# Patient Record
Sex: Female | Born: 1970 | Race: Black or African American | Hispanic: No | Marital: Married | Smoking: Former smoker
Health system: Southern US, Community
[De-identification: ages and names within clinical notes are randomized; demographics above are authoritative.]

## PROBLEM LIST (undated history)

## (undated) DIAGNOSIS — T7840XA Allergy, unspecified, initial encounter: Secondary | ICD-10-CM

## (undated) DIAGNOSIS — E663 Overweight: Secondary | ICD-10-CM

## (undated) DIAGNOSIS — Z0000 Encounter for general adult medical examination without abnormal findings: Secondary | ICD-10-CM

## (undated) DIAGNOSIS — E785 Hyperlipidemia, unspecified: Secondary | ICD-10-CM

## (undated) DIAGNOSIS — F419 Anxiety disorder, unspecified: Secondary | ICD-10-CM

## (undated) DIAGNOSIS — Z780 Asymptomatic menopausal state: Secondary | ICD-10-CM

## (undated) DIAGNOSIS — I10 Essential (primary) hypertension: Secondary | ICD-10-CM

## (undated) DIAGNOSIS — N926 Irregular menstruation, unspecified: Secondary | ICD-10-CM

## (undated) DIAGNOSIS — G43909 Migraine, unspecified, not intractable, without status migrainosus: Secondary | ICD-10-CM

## (undated) DIAGNOSIS — Z720 Tobacco use: Secondary | ICD-10-CM

## (undated) DIAGNOSIS — N898 Other specified noninflammatory disorders of vagina: Secondary | ICD-10-CM

## (undated) DIAGNOSIS — D649 Anemia, unspecified: Secondary | ICD-10-CM

## (undated) DIAGNOSIS — N938 Other specified abnormal uterine and vaginal bleeding: Secondary | ICD-10-CM

## (undated) DIAGNOSIS — N951 Menopausal and female climacteric states: Secondary | ICD-10-CM

## (undated) DIAGNOSIS — R232 Flushing: Secondary | ICD-10-CM

## (undated) HISTORY — DX: Overweight: E66.3

## (undated) HISTORY — DX: Menopausal and female climacteric states: N95.1

## (undated) HISTORY — DX: Anxiety disorder, unspecified: F41.9

## (undated) HISTORY — DX: Asymptomatic menopausal state: Z78.0

## (undated) HISTORY — PX: TUBAL LIGATION: SHX77

## (undated) HISTORY — DX: Allergy, unspecified, initial encounter: T78.40XA

## (undated) HISTORY — PX: ECTOPIC PREGNANCY SURGERY: SHX613

## (undated) HISTORY — DX: Other specified abnormal uterine and vaginal bleeding: N93.8

## (undated) HISTORY — DX: Tobacco use: Z72.0

## (undated) HISTORY — DX: Anemia, unspecified: D64.9

## (undated) HISTORY — DX: Other specified noninflammatory disorders of vagina: N89.8

## (undated) HISTORY — DX: Migraine, unspecified, not intractable, without status migrainosus: G43.909

## (undated) HISTORY — DX: Encounter for general adult medical examination without abnormal findings: Z00.00

## (undated) HISTORY — DX: Essential (primary) hypertension: I10

## (undated) HISTORY — DX: Flushing: R23.2

## (undated) HISTORY — DX: Hyperlipidemia, unspecified: E78.5

## (undated) HISTORY — PX: OTHER SURGICAL HISTORY: SHX169

## (undated) HISTORY — DX: Irregular menstruation, unspecified: N92.6

---

## 2001-04-14 ENCOUNTER — Emergency Department (HOSPITAL_COMMUNITY): Admission: EM | Admit: 2001-04-14 | Discharge: 2001-04-14 | Payer: Self-pay | Admitting: Emergency Medicine

## 2004-12-09 ENCOUNTER — Emergency Department (HOSPITAL_COMMUNITY): Admission: EM | Admit: 2004-12-09 | Discharge: 2004-12-09 | Payer: Self-pay | Admitting: Emergency Medicine

## 2005-08-22 ENCOUNTER — Ambulatory Visit: Payer: Self-pay | Admitting: Family Medicine

## 2005-12-28 ENCOUNTER — Encounter: Payer: Self-pay | Admitting: Family Medicine

## 2005-12-28 DIAGNOSIS — K5900 Constipation, unspecified: Secondary | ICD-10-CM | POA: Insufficient documentation

## 2005-12-28 DIAGNOSIS — D649 Anemia, unspecified: Secondary | ICD-10-CM | POA: Insufficient documentation

## 2005-12-28 DIAGNOSIS — F17200 Nicotine dependence, unspecified, uncomplicated: Secondary | ICD-10-CM | POA: Insufficient documentation

## 2005-12-28 DIAGNOSIS — G43909 Migraine, unspecified, not intractable, without status migrainosus: Secondary | ICD-10-CM

## 2005-12-28 DIAGNOSIS — O00109 Unspecified tubal pregnancy without intrauterine pregnancy: Secondary | ICD-10-CM | POA: Insufficient documentation

## 2006-01-02 ENCOUNTER — Ambulatory Visit: Payer: Self-pay | Admitting: Family Medicine

## 2006-01-02 ENCOUNTER — Other Ambulatory Visit: Admission: RE | Admit: 2006-01-02 | Discharge: 2006-01-02 | Payer: Self-pay | Admitting: Family Medicine

## 2006-01-07 ENCOUNTER — Encounter (INDEPENDENT_AMBULATORY_CARE_PROVIDER_SITE_OTHER): Payer: Self-pay | Admitting: Family Medicine

## 2006-01-23 ENCOUNTER — Encounter (INDEPENDENT_AMBULATORY_CARE_PROVIDER_SITE_OTHER): Payer: Self-pay | Admitting: Family Medicine

## 2006-01-23 LAB — CONVERTED CEMR LAB
ALT: 10 units/L (ref 0–35)
Albumin: 4.2 g/dL (ref 3.5–5.2)
Alkaline Phosphatase: 44 units/L (ref 39–117)
Eosinophils Relative: 8 % — ABNORMAL HIGH (ref 0–5)
Glucose, Bld: 104 mg/dL — ABNORMAL HIGH (ref 70–99)
HCT: 39.3 % (ref 36.0–46.0)
LDL Cholesterol: 137 mg/dL — ABNORMAL HIGH (ref 0–99)
Lymphocytes Relative: 36 % (ref 12–46)
Neutro Abs: 3.1 10*3/uL (ref 1.7–7.7)
Platelets: 383 10*3/uL (ref 150–400)
Potassium: 5 meq/L (ref 3.5–5.3)
RDW: 14.2 % — ABNORMAL HIGH (ref 11.5–14.0)
Sodium: 139 meq/L (ref 135–145)
TSH: 1.387 microintl units/mL (ref 0.350–5.50)
Total Protein: 7.2 g/dL (ref 6.0–8.3)
Triglycerides: 52 mg/dL (ref <150)

## 2006-01-26 ENCOUNTER — Encounter (INDEPENDENT_AMBULATORY_CARE_PROVIDER_SITE_OTHER): Payer: Self-pay | Admitting: Family Medicine

## 2006-06-26 ENCOUNTER — Telehealth (INDEPENDENT_AMBULATORY_CARE_PROVIDER_SITE_OTHER): Payer: Self-pay | Admitting: Family Medicine

## 2006-06-29 ENCOUNTER — Encounter (INDEPENDENT_AMBULATORY_CARE_PROVIDER_SITE_OTHER): Payer: Self-pay | Admitting: Family Medicine

## 2006-06-29 ENCOUNTER — Telehealth (INDEPENDENT_AMBULATORY_CARE_PROVIDER_SITE_OTHER): Payer: Self-pay | Admitting: *Deleted

## 2006-06-29 ENCOUNTER — Telehealth (INDEPENDENT_AMBULATORY_CARE_PROVIDER_SITE_OTHER): Payer: Self-pay | Admitting: Family Medicine

## 2007-01-14 ENCOUNTER — Encounter: Payer: Self-pay | Admitting: Family Medicine

## 2007-04-13 ENCOUNTER — Encounter (INDEPENDENT_AMBULATORY_CARE_PROVIDER_SITE_OTHER): Payer: Self-pay | Admitting: Family Medicine

## 2007-04-13 ENCOUNTER — Other Ambulatory Visit: Admission: RE | Admit: 2007-04-13 | Discharge: 2007-04-13 | Payer: Self-pay | Admitting: Family Medicine

## 2007-04-13 ENCOUNTER — Telehealth (INDEPENDENT_AMBULATORY_CARE_PROVIDER_SITE_OTHER): Payer: Self-pay | Admitting: *Deleted

## 2007-04-13 ENCOUNTER — Ambulatory Visit: Payer: Self-pay | Admitting: Family Medicine

## 2007-04-13 DIAGNOSIS — E669 Obesity, unspecified: Secondary | ICD-10-CM | POA: Insufficient documentation

## 2007-04-13 DIAGNOSIS — E785 Hyperlipidemia, unspecified: Secondary | ICD-10-CM | POA: Insufficient documentation

## 2007-04-13 DIAGNOSIS — N898 Other specified noninflammatory disorders of vagina: Secondary | ICD-10-CM | POA: Insufficient documentation

## 2007-04-14 ENCOUNTER — Telehealth (INDEPENDENT_AMBULATORY_CARE_PROVIDER_SITE_OTHER): Payer: Self-pay | Admitting: *Deleted

## 2007-04-14 ENCOUNTER — Encounter (INDEPENDENT_AMBULATORY_CARE_PROVIDER_SITE_OTHER): Payer: Self-pay | Admitting: Family Medicine

## 2007-04-14 ENCOUNTER — Ambulatory Visit (HOSPITAL_COMMUNITY): Admission: RE | Admit: 2007-04-14 | Discharge: 2007-04-14 | Payer: Self-pay | Admitting: Family Medicine

## 2007-04-14 LAB — CONVERTED CEMR LAB
Candida species: NEGATIVE
Gardnerella vaginalis: POSITIVE — AB
Trichomonal Vaginitis: NEGATIVE

## 2007-04-15 ENCOUNTER — Telehealth (INDEPENDENT_AMBULATORY_CARE_PROVIDER_SITE_OTHER): Payer: Self-pay | Admitting: *Deleted

## 2007-04-16 LAB — CONVERTED CEMR LAB: Pap Smear: NORMAL

## 2007-04-17 ENCOUNTER — Encounter (INDEPENDENT_AMBULATORY_CARE_PROVIDER_SITE_OTHER): Payer: Self-pay | Admitting: Family Medicine

## 2007-04-19 LAB — CONVERTED CEMR LAB
ALT: 10 units/L (ref 0–35)
AST: 15 units/L (ref 0–37)
Basophils Absolute: 0 10*3/uL (ref 0.0–0.1)
Basophils Relative: 1 % (ref 0–1)
CO2: 23 meq/L (ref 19–32)
Calcium: 9.5 mg/dL (ref 8.4–10.5)
Chloride: 110 meq/L (ref 96–112)
Cholesterol: 175 mg/dL (ref 0–200)
Hemoglobin: 12.5 g/dL (ref 12.0–15.0)
Lymphocytes Relative: 30 % (ref 12–46)
Neutro Abs: 5.1 10*3/uL (ref 1.7–7.7)
Neutrophils Relative %: 59 % (ref 43–77)
Platelets: 328 10*3/uL (ref 150–400)
Potassium: 4.3 meq/L (ref 3.5–5.3)
RDW: 14.6 % (ref 11.5–15.5)
Sodium: 142 meq/L (ref 135–145)
TSH: 1.503 microintl units/mL (ref 0.350–5.50)
Total Protein: 7.1 g/dL (ref 6.0–8.3)

## 2007-04-27 ENCOUNTER — Ambulatory Visit: Payer: Self-pay | Admitting: Family Medicine

## 2007-04-27 LAB — CONVERTED CEMR LAB
Cholesterol, target level: 200 mg/dL
LDL Goal: 160 mg/dL

## 2007-05-27 ENCOUNTER — Telehealth (INDEPENDENT_AMBULATORY_CARE_PROVIDER_SITE_OTHER): Payer: Self-pay | Admitting: *Deleted

## 2007-07-29 ENCOUNTER — Ambulatory Visit: Payer: Self-pay | Admitting: Family Medicine

## 2007-09-09 ENCOUNTER — Ambulatory Visit: Payer: Self-pay | Admitting: Family Medicine

## 2008-10-04 ENCOUNTER — Encounter (INDEPENDENT_AMBULATORY_CARE_PROVIDER_SITE_OTHER): Payer: Self-pay | Admitting: Family Medicine

## 2009-04-09 ENCOUNTER — Ambulatory Visit: Payer: Self-pay | Admitting: Family Medicine

## 2009-04-09 DIAGNOSIS — R5381 Other malaise: Secondary | ICD-10-CM | POA: Insufficient documentation

## 2009-04-09 DIAGNOSIS — R5383 Other fatigue: Secondary | ICD-10-CM

## 2009-04-09 DIAGNOSIS — E049 Nontoxic goiter, unspecified: Secondary | ICD-10-CM

## 2009-05-01 ENCOUNTER — Encounter: Payer: Self-pay | Admitting: Family Medicine

## 2009-06-21 ENCOUNTER — Encounter: Payer: Self-pay | Admitting: Family Medicine

## 2009-09-07 ENCOUNTER — Telehealth: Payer: Self-pay | Admitting: Family Medicine

## 2009-09-10 ENCOUNTER — Telehealth: Payer: Self-pay | Admitting: Family Medicine

## 2009-09-28 ENCOUNTER — Other Ambulatory Visit: Admission: RE | Admit: 2009-09-28 | Discharge: 2009-09-28 | Payer: Self-pay | Admitting: Family Medicine

## 2009-09-28 ENCOUNTER — Ambulatory Visit: Payer: Self-pay | Admitting: Family Medicine

## 2009-09-28 DIAGNOSIS — R7303 Prediabetes: Secondary | ICD-10-CM

## 2009-10-04 ENCOUNTER — Ambulatory Visit (HOSPITAL_COMMUNITY): Admission: RE | Admit: 2009-10-04 | Discharge: 2009-10-04 | Payer: Self-pay | Admitting: Family Medicine

## 2009-10-04 ENCOUNTER — Encounter: Payer: Self-pay | Admitting: Family Medicine

## 2009-10-04 LAB — CONVERTED CEMR LAB: Pap Smear: NEGATIVE

## 2009-10-16 ENCOUNTER — Encounter: Payer: Self-pay | Admitting: Family Medicine

## 2009-10-16 LAB — CONVERTED CEMR LAB
Basophils Absolute: 0 10*3/uL (ref 0.0–0.1)
Basophils Relative: 1 % (ref 0–1)
CO2: 26 meq/L (ref 19–32)
Calcium: 9.7 mg/dL (ref 8.4–10.5)
Cholesterol: 171 mg/dL (ref 0–200)
Creatinine, Ser: 1.04 mg/dL (ref 0.40–1.20)
Eosinophils Relative: 5 % (ref 0–5)
HCT: 37.8 % (ref 36.0–46.0)
HDL: 47 mg/dL (ref >39)
Hemoglobin: 12.6 g/dL (ref 12.0–15.0)
Lymphocytes Relative: 42 % (ref 12–46)
MCHC: 33.3 g/dL (ref 30.0–36.0)
Monocytes Absolute: 0.3 10*3/uL (ref 0.1–1.0)
Platelets: 407 10*3/uL — ABNORMAL HIGH (ref 150–400)
RDW: 13.8 % (ref 11.5–15.5)
Sodium: 136 meq/L (ref 135–145)
TSH: 2.271 microintl units/mL (ref 0.350–4.500)
Total CHOL/HDL Ratio: 3.6
Triglycerides: 60 mg/dL (ref <150)

## 2009-10-17 ENCOUNTER — Ambulatory Visit (HOSPITAL_COMMUNITY): Admission: RE | Admit: 2009-10-17 | Discharge: 2009-10-17 | Payer: Self-pay | Admitting: Family Medicine

## 2009-11-19 ENCOUNTER — Telehealth: Payer: Self-pay | Admitting: Family Medicine

## 2009-11-21 ENCOUNTER — Telehealth: Payer: Self-pay | Admitting: Family Medicine

## 2009-12-28 ENCOUNTER — Ambulatory Visit: Payer: Self-pay | Admitting: Family Medicine

## 2010-02-12 NOTE — Letter (Signed)
Summary: progress notes  progress notes   Imported By: Curtis Sites 08/06/2009 11:48:07  _____________________________________________________________________  External Attachment:    Type:   Image     Comment:   External Document

## 2010-02-12 NOTE — Letter (Signed)
Summary: demographic  demographic   Imported By: Curtis Sites 08/06/2009 11:47:24  _____________________________________________________________________  External Attachment:    Type:   Image     Comment:   External Document

## 2010-02-12 NOTE — Progress Notes (Signed)
Summary: refill  Phone Note Call from Patient   Summary of Call: pt states rx needs to go to [facility]. not [facility] [phone] Initial call taken by: Rudene Anda,  September 10, 2009 10:06 AM    Prescriptions: PHENTERMINE HCL 37.5 MG TABS (PHENTERMINE HCL) Take 1 tablet by mouth once a day  #30 x 0   Entered by:   Adella Hare LPN   Authorized by:   Syliva Overman MD   Signed by:   Adella Hare LPN on 84/16/6063   Method used:   Printed then faxed to ...       [address] [phone]* (retail)       [address]       [place], [place]  [address]       Ph: [phone] or [phone]       Fax: [phone]   RxID:   [id number]  rx at [facility] cancelled Adella Hare LPN  September 10, 2009 10:14 AM

## 2010-02-12 NOTE — Assessment & Plan Note (Signed)
Summary: phy   Vital Signs:  Patient profile:   40 year old female Menstrual status:  regular Height:      66 inches Weight:      196.75 pounds BMI:     31.87 O2 Sat:      98 % Pulse rate:   68 / minute Pulse rhythm:   regular Resp:     16 per minute BP sitting:   122 / 80  (left arm) Cuff size:   large  Vitals Entered By: Everitt Amber LPN (September 28, 2009 9:35 AM)  Nutrition Counseling: Patient's BMI is greater than 25 and therefore counseled on weight management options. CC: CPE   Primary Care [facility] Thede:  Syliva Overman MD  CC:  CPE.  History of Present Illness: Reports  that she has been doing well. Denies recent fever or chills. Denies sinus pressure, nasal congestion , ear pain or sore throat. Denies chest congestion, or cough productive of sputum. Denies chest pain, palpitations, PND, orthopnea or leg swelling. Denies abdominal pain, nausea, vomitting, diarrhea or constipation. Denies change in bowel movements or bloody stool. Denies dysuria , frequency, incontinence or hesitancy. Denies  joint pain, swelling, or reduced mobility. Denies headaches, vertigo, seizures. Denies depression, anxiety or insomnia. Denies  rash, lesions, or itch. Dissatisfied with weight loss and rightly so, only exercising 3 days per week, will inc also still drinking sodas, will stop. C/O anorgasmia, though interest in intercourse still present.Spouse home on weekends only, encouraged Date nights etc     Allergies: No Known Drug Allergies  Past History:  Past medical, surgical, family and social histories (including risk factors) reviewed for relevance to current acute and chronic problems.  Past Medical History: Reviewed history from 07/29/2007 and no changes required. VAGINAL DISCHARGE (ICD-623.5) - BV HYPERLIPIDEMIA (ICD-272.4) OVERWEIGHT (ICD-278.02) WELL ADULT EXAM (ICD-V70.0) DISORDER, TOBACCO USE (ICD-305.1) MIGRAINE HEADACHE (ICD-346.90) CONSTIPATION  (ICD-564.00) TUBAL PREGNANCY (ICD-633.10) ANEMIA-NOS (ICD-285.9)  Past Surgical History: Reviewed history from 04/13/2007 and no changes required. OOPHORECTOMY AND TUBAL LIGATION - Ectopic Pregnancy  Family History: Reviewed history from 04/09/2009 and no changes required. Father: 92 - CAD with CABG Mother 56 - HTN, COPD, DM Brothers - 1-   - W&L Sisters: 2 - and 75 - HTN  Social History: Reviewed history from 04/09/2009 and no changes required. 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 Endo:  Denies excessive thirst and excessive urination. Heme:  Denies abnormal bruising and bleeding. Allergy:  Denies hives or rash and itching eyes.  Physical Exam  General:  Well-developed,obese,in no acute distress; alert,appropriate and cooperative throughout examination Head:  Normocephalic and atraumatic without obvious abnormalities. No apparent alopecia or balding. Eyes:  No corneal or conjunctival inflammation noted. EOMI. Perrla. Funduscopic exam benign, without hemorrhages, exudates or papilledema. Vision grossly normal. Ears:  External ear exam shows no significant lesions or deformities.  Otoscopic examination reveals clear canals, tympanic membranes are intact bilaterally without bulging, retraction, inflammation or discharge. Hearing is grossly normal bilaterally. Nose:  External nasal examination shows no deformity or inflammation. Nasal mucosa are pink and moist without lesions or exudates. Mouth:  Oral mucosa and oropharynx without lesions or exudates.  Teeth in good repair. Neck:  No deformities, masses, or tenderness noted. Chest Wall:  No deformities, masses, or tenderness noted. Breasts:  No mass, nodules, thickening, tenderness, bulging, retraction,  inflamation, nipple discharge or skin changes noted.   Lungs:  Normal respiratory  effort, chest expands symmetrically. Lungs are clear to auscultation, no crackles or wheezes. Heart:  Normal rate and regular rhythm. S1 and S2 normal without gallop, murmur, click, rub or other extra sounds. Abdomen:  Bowel sounds positive,abdomen soft and non-tender without masses, organomegaly or hernias noted. Rectal:  No external abnormalities noted. Normal sphincter tone. No rectal masses or tenderness. Genitalia:  Normal introitus for age, no external lesions, no vaginal discharge, mucosa pink and moist, no vaginal or cervical lesions, no vaginal atrophy, no friaility or hemorrhage, normal uterus size and position, no adnexal masses or tenderness Msk:  No deformity or scoliosis noted of thoracic or lumbar spine.   Pulses:  R and L carotid,radial,femoral,dorsalis pedis and posterior tibial pulses are full and equal bilaterally Extremities:  No clubbing, cyanosis, edema, or deformity noted with normal full range of motion of all joints.   Neurologic:  No cranial nerve deficits noted. Station and gait are normal. Plantar reflexes are down-going bilaterally. DTRs are symmetrical throughout. Sensory, motor and coordinative functions appear intact. Skin:  Intact without suspicious lesions or rashes Cervical Nodes:  No lymphadenopathy noted Axillary Nodes:  No palpable lymphadenopathy Inguinal Nodes:  No significant adenopathy Psych:  Cognition and judgment appear intact. Alert and cooperative with normal attention span and concentration. No apparent delusions, illusions, hallucinations   Impression & Recommendations:  Problem # 1:  SPECIAL SCREENING FOR MALIGNANT NEOPLASMS COLON (ICD-V76.51) Assessment Comment Only  Orders: Hemoccult Guaiac-1 spec.(in office) (82270)  Problem # 2:  SCREENING FOR MALIGNANT NEOPLASM OF THE CERVIX (ICD-V76.2) Assessment: Comment Only  Orders: Pap Smear ([address])  Problem # 3:  IMPAIRED FASTING GLUCOSE (ICD-790.21) Assessment: Comment Only  Orders: T-  Hemoglobin A1C (60454-09811) Pt advised to reduce carbohydrate intake, espescially sweets, and to start regular physical activity, at least 30 minutes 5 days weekly, to enable weight loss, and reduce the risk of becoming diabetic   Problem # 4:  HYPERLIPIDEMIA (ICD-272.4) Assessment: Comment Only  Orders: T-Lipid Profile ([id number]) Low fat diet discussed and encouraged, and literature also given   Labs Reviewed: SGOT: 15 (04/17/2007)   SGPT: 10 (04/17/2007)  Lipid Goals: Chol Goal: 200 (04/27/2007)   HDL Goal: 40 (04/27/2007)   LDL Goal: 160 (04/27/2007)   TG Goal: 150 (04/27/2007)  Prior 10 Yr Risk Heart Disease: 1 % (09/09/2007)   HDL:46 (04/17/2007), 45 (01/23/2006)  LDL:121 (04/17/2007), 137 (01/23/2006)  Chol:175 (04/17/2007), 192 (01/23/2006)  Trig:40 (04/17/2007), 52 (01/23/2006)  Problem # 5:  OBESITY (ICD-278.00) Assessment: Unchanged  Ht: 66 (09/28/2009)   Wt: 196.75 (09/28/2009)   BMI: 31.87 (09/28/2009)  Complete Medication List: 1)  Phentermine Hcl 37.5 Mg Tabs (Phentermine hcl) .... Take 1 tablet by mouth once a day  Other Orders: Radiology other (Radiology Other) T-Basic Metabolic Panel [id number]) T-TSH [phone]) T-CBC w/Diff [phone]) Influenza Vaccine NON MCR (36644) Tdap => 29yrs IM (03474) Admin 1st Vaccine (25956) Admin 1st Vaccine [facility]) [phone])  Patient Instructions: 1)  Please schedule a follow-up appointment in 3 months. 2)  BMP prior to visit, ICD-9: 3)  Lipid Panel prior to visit, ICD-9: 4)  TSH prior to visit, ICD-9:   fasting asap 5)  CBC w/ Diff prior to visit, ICD-9: 6)  HbgA1C prior to visit, ICD-9: 7)  !200 to 1500 cal diet  8)  continue phentermine half daily. 9)  It is important that you exercise regularly at least 60 minutes 7 times a week. If you develop  chest pain, have severe difficulty breathing, or feel very tired , stop exercising immediately and seek medical attention. 10)  You need to lose weight. Consider a  lower calorie diet and regular exercise. On avg goal is 3 to 4 pounds per week Prescriptions: PHENTERMINE HCL 37.5 MG TABS (PHENTERMINE HCL) Take 1 tablet by mouth once a day  #30 x 1   Entered by:   Everitt Amber LPN   Authorized by:   Syliva Overman MD   Signed by:   Everitt Amber LPN on 16/10/9602   Method used:   Printed then faxed to ...       [address] [phone]* (retail)       [address]       [place], [place]  [address]       Ph: [phone] or [phone]       Fax: [phone]   RxID:   [id number]     Tetanus/Td Vaccine    Vaccine Type: Tdap    Site: left deltoid    Mfr: boostrix    Dose: 0.5 ml    Route: IM    Given by: Everitt Amber LPN    Exp. Date: 07/13/2011    Lot #: [id number]  Influenza Vaccine    Vaccine Type: Fluvax Non-MCR    Site: right deltoid    Mfr: novartis     Dose: 0.5 ml    Route: IM    Given by: Everitt Amber LPN    Exp. Date: 05/2010    Lot #: [id number]

## 2010-02-12 NOTE — Letter (Signed)
Summary: misc.  misc.   Imported By: Curtis Sites 08/06/2009 11:47:52  _____________________________________________________________________  External Attachment:    Type:   Image     Comment:   External Document

## 2010-02-12 NOTE — Progress Notes (Signed)
Summary: MEDICINE  Phone Note Call from Patient   Summary of Call: CAME IN MONDAY FOR WEIGH IN AND WANTS TO KNOW WILL YOU CALL IN THE RX FOR DIET PILLS OR DOES SHE NEED A VISIT  NEXT VISIT IS DEC 16 [facility] TO [facility] HER AND LET KNOW LET KNOW SHE IS OUT [phone] Initial call taken by: Lind Guest,  November 21, 2009 9:24 AM  Follow-up for Phone Call        patient aware med has been sent  Follow-up by: Adella Hare LPN,  November 21, 2009 10:06 AM

## 2010-02-12 NOTE — Letter (Signed)
Summary: Letter  Letter   Imported By: Lind Guest 10/04/2009 15:51:32  _____________________________________________________________________  External Attachment:    Type:   Image     Comment:   External Document

## 2010-02-12 NOTE — Assessment & Plan Note (Signed)
Summary: new pt   Vital Signs:  Patient profile:   40 year old female Menstrual status:  regular LMP:     03/16/2009 Height:      66 inches Weight:      207.25 pounds BMI:     33.57 O2 Sat:      97 % Pulse rate:   63 / minute Pulse rhythm:   regular Resp:     16 per minute BP sitting:   124 / 80  (left arm) Cuff size:   large  Vitals Entered By: Everitt Amber LPN (April 09, 2009 3:55 PM)  Nutrition Counseling: Patient's BMI is greater than 25 and therefore counseled on weight management options. CC: New patient LMP (date): 03/16/2009     Menstrual Status regular Enter LMP: 03/16/2009 Last PAP Result normal   Primary Care Provider:  Syliva Overman MD  CC:  New patient.  History of Present Illness: Pt's main concern is he rweight . Since  stopping nicotine she has gained quite a bit of weight. She exercises on avg weekly. Has used phentermine in the past with good result Reports  that she is otherwise  doing well. Denies recent fever or chills. Denies sinus pressure, nasal congestion , ear pain or sore throat. Denies chest congestion, or cough productive of sputum. Denies chest pain, palpitations, PND, orthopnea or leg swelling. Denies abdominal pain, nausea, vomitting, diarrhea or constipation. Denies change in bowel movements or bloody stool. Denies dysuria , frequency, incontinence or hesitancy. Denies  joint pain, swelling, or reduced mobility. Denies headaches, vertigo, seizures. Denies depression, anxiety or insomnia. Denies  rash, lesions, or itch.     Current Medications (verified): 1)  None  Allergies (verified): No Known Drug Allergies  Past History:  Past Medical History: Last updated: 07/29/2007 VAGINAL DISCHARGE (ICD-623.5) - BV HYPERLIPIDEMIA (ICD-272.4) OVERWEIGHT (ICD-278.02) WELL ADULT EXAM (ICD-V70.0) DISORDER, TOBACCO USE (ICD-305.1) MIGRAINE HEADACHE (ICD-346.90) CONSTIPATION (ICD-564.00) TUBAL PREGNANCY  (ICD-633.10) ANEMIA-NOS (ICD-285.9)  Past Surgical History: Last updated: 04/13/2007 OOPHORECTOMY AND TUBAL LIGATION - Ectopic Pregnancy  Family History: Last updated: 04/09/2009 Father: 39 - CAD with CABG Mother 62 - HTN, COPD, DM Brothers - 1-   - W&L Sisters: 2 - and 37 - HTN  Social History: Last updated: 04/09/2009 Occupation: Dentist Smoker -quit in 2008 ETOH - none Married 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 GI:  Complains of abdominal pain, constipation, excessive appetite, nausea, and vomiting; chronic on avg  3 days per week, she has nausea with headaches . Neuro:  Complains of headaches; denies poor balance and seizures; on avg one every 3 months. Endo:  Denies cold intolerance, excessive hunger, excessive thirst, excessive urination, heat intolerance, polyuria, and weight change. Heme:  Denies abnormal bruising and bleeding. Allergy:  Complains of seasonal allergies.  Physical Exam  General:  Well-developed,obese,in no acute distress; alert,appropriate and cooperative throughout examination HEENT: No facial asymmetry,  EOMI, No sinus tenderness, TM's Clear, oropharynx  pink and moist.   Chest: Clear to auscultation  bilaterally.  CVS: S1, S2, No murmurs, No S3.   Abd: Soft, Nontender.  MS: Adequate ROM spine, hips, shoulders and knees.  Ext: No edema.   CNS: CN 2-12 intact, power tone and sensation normal throughout.   Skin: Intact, no visible lesions or rashes.  Psych: Good eye contact, normal affect.   Memory intact, not anxious or depressed appearing.    Impression & Recommendations:  Problem # 1:  OBESITY (ICD-278.00) Assessment Comment Only  Ht: 66 (04/09/2009)   Wt: 207.25 (04/09/2009)   BMI: 33.57 (04/09/2009)pt counselled on dietary change and regularlexercise for approx 10 mins and phentermine also prescribed.  Problem # 2:  HYPERLIPIDEMIA (ICD-272.4) Assessment: Comment Only  Orders: T-Lipid Profile ([id number])labs to beobtained asap, low fat diet info given  Labs Reviewed: SGOT: 15 (04/17/2007)   SGPT: 10 (04/17/2007)  Lipid Goals: Chol Goal: 200 (04/27/2007)   HDL Goal: 40 (04/27/2007)   LDL Goal: 160 (04/27/2007)   TG Goal: 150 (04/27/2007)  Prior 10 Yr Risk Heart Disease: 1 % (09/09/2007)   HDL:46 (04/17/2007), 45 (01/23/2006)  LDL:121 (04/17/2007), 137 (01/23/2006)  Chol:175 (04/17/2007), 192 (01/23/2006)  Trig:40 (04/17/2007), 52 (01/23/2006)  Complete Medication List: 1)  Phentermine Hcl 37.5 Mg Tabs (Phentermine hcl) .... Take 1 tablet by mouth once a day  Other Orders: T-Basic Metabolic Panel [phone]) T-CBC w/Diff [phone]) T-TSH [phone]) T-Vitamin D (25-Hydroxy) [phone])  Patient Instructions: 1)  Please schedule a follow-up appointment in 2 months. 2)  It is important that you exercise regularly at least 60 minutes 6 times a week. If you develop chest pain, have severe difficulty breathing, or feel very tired , stop exercising immediately and seek medical attention. 3)  You need to lose weight. Consider a lower calorie diet and regular exercise.  4)  Pls start phentermineHALF tab daily, stop and call if adverse sid effects 5)  BMP prior to visit, ICD-9: 6)  Lipid Panel prior to visit, ICD-9:  fasting  asap 7)  TSH prior to visit, ICD-9: 8)  CBC w/ Diff prior to visit, ICD-9: 9)  Congrats on stopping smoking. 10)  You need a mamo and thyroid US , pkls call when you are able Prescriptions: PHENTERMINE HCL 37.5 MG TABS (PHENTERMINE  HCL) Take 1 tablet by mouth once a day  #30 x 0   Entered and Authorized by:   Syliva Overman MD   Signed by:   Syliva Overman MD on 04/09/2009   Method used:   Print then Give to Patient   RxID:   [id number]

## 2010-02-12 NOTE — Progress Notes (Signed)
  Phone Note Call from Patient   Caller: Patient Summary of Call: patient was supposed to have PAP today but started her menstral cycle, came into office and rescheduled appt and also wanted to weigh because Dr has her on phentermine. weighed 196.04 wants refill for phentermine sent [facility]. Initial call taken by: Adella Hare LPN,  September 07, 2009 8:38 AM  Follow-up for Phone Call        let pgt know shwe needs to lose weightto demonstrate the value ofphentermine , she has not, refill x 1 only, needs to have ov resched before further refills Follow-up by: Syliva Overman MD,  September 07, 2009 9:35 AM  Additional Follow-up for Phone Call Additional follow up Details #1::        #DISCONNECTED RX SENT Additional Follow-up by: Adella Hare LPN,  September 07, 2009 3:04 PM    Prescriptions: PHENTERMINE HCL 37.5 MG TABS (PHENTERMINE HCL) Take 1 tablet by mouth once a day  #30 x 0   Entered by:   Adella Hare LPN   Authorized by:   Syliva Overman MD   Signed by:   Adella Hare LPN on 35/57/3220   Method used:   Printed then faxed to ...       [facility] [address] [phone]* (retail)       [address] [place]  [address]       Ph: [phone]       Fax: [phone]   RxID:   [id number]

## 2010-02-12 NOTE — Progress Notes (Signed)
  Phone Note Call from Patient   Caller: Patient Summary of Call: patient walked into office today to weigh 192.04 wants refill on phentermine Initial call taken by: Adella Hare LPN,  November 19, 2009 9:41 AM  Follow-up for Phone Call        refill x 1 needss ov before any further refills pls Follow-up by: Syliva Overman MD,  November 19, 2009 1:04 PM    Prescriptions: PHENTERMINE HCL 37.5 MG TABS (PHENTERMINE HCL) Take 1 tablet by mouth once a day  #30 x 0   Entered by:   Adella Hare LPN   Authorized by:   Syliva Overman MD   Signed by:   Adella Hare LPN on 16/10/9602   Method used:   Printed then faxed to ...       [address] [phone]* (retail)       [address]       [place], [place]  [address]       Ph: [phone] or [phone]       Fax: [phone]   RxID:   [id number]

## 2010-02-12 NOTE — Letter (Signed)
Summary: labs  labs   Imported By: Curtis Sites 08/06/2009 11:47:36  _____________________________________________________________________  External Attachment:    Type:   Image     Comment:   External Document

## 2010-02-12 NOTE — Assessment & Plan Note (Signed)
Summary: physical   Vital Signs:  Patient profile:   40 year old female Menstrual status:  regular LMP:     06/12/2009 Height:      66 inches Weight:      195.50 pounds BMI:     31.67 O2 Sat:      96 % Pulse rate:   101 / minute Resp:     16 per minute CC: CPE  Vision Screening:Left eye w/o correction: 20 / 30 Right Eye w/o correction: 20 / 30 Both eyes w/o correction:  20/ 25  Color vision testing: normal     Vision Comments: didn't bring glasses  Vision Entered By: Syliva Overman MD (June 21, 2009 2:21 PM) LMP (date): 06/12/2009     Enter LMP: 06/12/2009 Last PAP Result normal   CC:  CPE.  Current Medications (verified): 1)  Phentermine Hcl 37.5 Mg Tabs (Phentermine Hcl) .... Take 1 Tablet By Mouth Once A Day  Allergies (verified): No Known Drug Allergies   Complete Medication List: 1)  Phentermine Hcl 37.5 Mg Tabs (Phentermine hcl) .... Take 1 tablet by mouth once a day rescheduled appt since she will have insurance July 1

## 2010-02-14 NOTE — Assessment & Plan Note (Signed)
Summary: F UP   Vital Signs:  Patient profile:   40 year old female Menstrual status:  regular Height:      66 inches Weight:      191.75 pounds BMI:     31.06 O2 Sat:      99 % on Room air Pulse rate:   94 / minute Pulse rhythm:   regular Resp:     16 per minute BP sitting:   120 / 90  (left arm)  Vitals Entered By: Adella Hare LPN (December 28, 2009 8:19 AM)  Nutrition Counseling: Patient's BMI is greater than 25 and therefore counseled on weight management options.  O2 Flow:  Room air CC: follow-up visit Is Patient Diabetic? No   Primary Care Provider:  Syliva Overman MD  CC:  follow-up visit.  History of Present Illness: Reports  that t she has been doing well. she denies intolerance or any adverse side efeects from the phentermine, and has actually lost 17 pounds in the past months, which is wonderful. Denies recent fever or chills. Denies sinus pressure, nasal congestion , ear pain or sore throat. Denies chest congestion, or cough productive of sputum. Denies chest pain, palpitations, PND, orthopnea or leg swelling. Denies abdominal pain, nausea, vomitting, diarrhea or constipation. Denies change in bowel movements or bloody stool. Denies dysuria , frequency, incontinence or hesitancy. Denies  joint pain, swelling, or reduced mobility. Denies headaches, vertigo, seizures. Denies depression, anxiety or insomnia. Denies  rash, lesions, or itch.     Current Medications (verified): 1)  Phentermine Hcl 37.5 Mg Tabs (Phentermine Hcl) .... Take 1 Tablet By Mouth Once A Day  Allergies (verified): No Known Drug Allergies  Review of Systems      See HPI Eyes:  Denies blurring and discharge. Endo:  Denies cold intolerance, excessive hunger, excessive thirst, excessive urination, and heat intolerance. Heme:  Denies abnormal bruising and bleeding. Allergy:  Denies hives or rash and itching eyes.  Physical Exam  General:  Well-developed,well-nourished,in no  acute distress; alert,appropriate and cooperative throughout examination HEENT: No facial asymmetry,  EOMI, No sinus tenderness, TM's Clear, oropharynx  pink and moist.   Chest: Clear to auscultation bilaterally.  CVS: S1, S2, No murmurs, No S3.   Abd: Soft, Nontender.  MS: Adequate ROM spine, hips, shoulders and knees.  Ext: No edema.   CNS: CN 2-12 intact, power tone and sensation normal throughout.   Skin: Intact, no visible lesions or rashes.  Psych: Good eye contact, normal affect.  Memory intact, not anxious or depressed appearing.    Impression & Recommendations:  Problem # 1:  IMPAIRED FASTING GLUCOSE (ICD-790.21) Assessment Comment Only  Labs Reviewed: Creat: 1.04 (10/16/2009)    hBA1C  5.8 Pt advised to reduce carbohydrate intake, espescially sweets, and to start regular physical activity, at least 30 minutes 5 days weekly, to enable weight loss, and reduce the risk of becoming diabetic   Problem # 2:  OBESITY (ICD-278.00) Assessment: Improved  Ht: 66 (12/28/2009)   Wt: 191.75 (12/28/2009)   BMI: 31.06 (12/28/2009) therapeutic lifestyle change discussed and encouraged continue phentermine hal;f daily  Problem # 3:  HYPERLIPIDEMIA (ICD-272.4) Assessment: Comment Only  Labs Reviewed: SGOT: 15 (04/17/2007)   SGPT: 10 (04/17/2007)  Lipid Goals: Chol Goal: 200 (04/27/2007)   HDL Goal: 40 (04/27/2007)   LDL Goal: 160 (04/27/2007)   TG Goal: 150 (04/27/2007)  Prior 10 Yr Risk Heart Disease: 1 % (09/09/2007)   HDL:47 (10/16/2009), 46 (04/17/2007)  LDL:112 (10/16/2009), 121 (04/17/2007)  Chol:171 (10/16/2009), 175 (04/17/2007)  Trig:60 (10/16/2009), 40 (04/17/2007) Low fat dietdiscussed and encouraged  Complete Medication List: 1)  Ibuprofen 800 Mg Tabs (Ibuprofen) .... Take 1 tablet by mouth two times a day as needed for headache, max 6 per week 2)  Phentermine Hcl 37.5 Mg Tabs (Phentermine hcl) .... Take 1 tablet by mouth once a day 3)  Promethazine Hcl 25 Mg Tabs  (Promethazine hcl) .... Take 1 tablet by mouth two times a day as needed for nausea  Patient Instructions: 1)  Please schedule a follow-up appointment in 4 months. 2)  It is important that you exercise regularly at least 30 minutes 5 times a week. If you develop chest pain, have severe difficulty breathing, or feel very tired , stop exercising immediately and seek medical attention. 3)  You need to lose weight. Consider a lower calorie diet and regular exercise. congrats  on 16 pound weight  loss since March, keep it up!! 4)  continue a low fat diet !Prescriptions: PROMETHAZINE HCL 25 MG TABS (PROMETHAZINE HCL) Take 1 tablet by mouth two times a day as needed for nausea  #15 x 0   Entered and Authorized by:   Syliva Overman MD   Signed by:   Syliva Overman MD on 12/28/2009   Method used:   Electronically to        [facility] [phone]* (retail)       [address]       [place], [place]  [address]       Ph: [phone] or [phone]       Fax: [phone]   RxID:   [id number] PHENTERMINE HCL 37.5 MG TABS (PHENTERMINE HCL) Take 1 tablet by mouth once a day  #30 x 1   Entered and Authorized by:   Syliva Overman MD   Signed by:   Syliva Overman MD on 12/28/2009   Method used:   Printed then faxed to ...       [address] [phone]* (retail)       [address]       [place], [place]  [address]       Ph: [phone] or [phone]       Fax: [phone]   RxID:   [id number] IBUPROFEN 800 MG TABS (IBUPROFEN) Take 1 tablet by mouth two times a day as needed for headache, max 6 per week  #40 x 1   Entered and Authorized by:   Syliva Overman MD   Signed by:   Syliva Overman MD on 12/28/2009   Method used:   Electronically to        [facility] [phone]* (retail)       [address]       [place], [place]  [address]       Ph: [phone] or [phone]       Fax: [phone]   RxID:    [id number]    Orders Added: 1)  Est. Patient Level III [78938]

## 2010-03-18 ENCOUNTER — Other Ambulatory Visit: Payer: Self-pay | Admitting: Family Medicine

## 2010-03-18 DIAGNOSIS — Z09 Encounter for follow-up examination after completed treatment for conditions other than malignant neoplasm: Secondary | ICD-10-CM

## 2010-04-24 ENCOUNTER — Ambulatory Visit (HOSPITAL_COMMUNITY)
Admission: RE | Admit: 2010-04-24 | Discharge: 2010-04-24 | Disposition: A | Discharge status: Home / Self Care | Payer: BC Managed Care – PPO | Source: Ambulatory Visit | Attending: Family Medicine | Admitting: Family Medicine

## 2010-04-24 DIAGNOSIS — N63 Unspecified lump in unspecified breast: Secondary | ICD-10-CM | POA: Insufficient documentation

## 2010-04-24 DIAGNOSIS — Z09 Encounter for follow-up examination after completed treatment for conditions other than malignant neoplasm: Secondary | ICD-10-CM

## 2010-05-01 ENCOUNTER — Encounter: Payer: Self-pay | Admitting: Family Medicine

## 2010-05-02 ENCOUNTER — Encounter: Payer: Self-pay | Admitting: Family Medicine

## 2010-05-03 ENCOUNTER — Ambulatory Visit (INDEPENDENT_AMBULATORY_CARE_PROVIDER_SITE_OTHER): Payer: BC Managed Care – PPO | Admitting: Family Medicine

## 2010-05-03 ENCOUNTER — Encounter: Payer: Self-pay | Admitting: Family Medicine

## 2010-05-03 DIAGNOSIS — R5383 Other fatigue: Secondary | ICD-10-CM

## 2010-05-03 DIAGNOSIS — E049 Nontoxic goiter, unspecified: Secondary | ICD-10-CM

## 2010-05-03 DIAGNOSIS — E785 Hyperlipidemia, unspecified: Secondary | ICD-10-CM

## 2010-05-03 DIAGNOSIS — R5381 Other malaise: Secondary | ICD-10-CM

## 2010-05-03 DIAGNOSIS — D649 Anemia, unspecified: Secondary | ICD-10-CM

## 2010-05-03 DIAGNOSIS — R7301 Impaired fasting glucose: Secondary | ICD-10-CM

## 2010-05-03 MED ORDER — PHENTERMINE HCL 37.5 MG PO CAPS: 37.5000 mg | ORAL_CAPSULE | ORAL | Status: DC

## 2010-05-03 NOTE — Patient Instructions (Signed)
CPE September 17 or after.  Congrats on weight loss, keep it up.  It is important that you exercise regularly at least 30 minutes 5 times a week. If you develop chest pain, have severe difficulty breathing, or feel very tired, stop exercising immediately and seek medical attention   CBC , fasting chem 7, lipid, TSH  And HBA1C,   Continue current meds as you are doing

## 2010-05-04 NOTE — Progress Notes (Signed)
  Subjective:    Patient ID: Chelsey Lewis, female    DOB: 1970/05/15, 40 y.o.   MRN: [id number]  HPI The PT is here for follow up and re-evaluation of chronic medical condition and  medication management Preventive health is updated, specifically  Cancer screening, and Immunization.   There are no new concerns.  There are no specific complaints . Chelsey Lewis has modified her diet significantly, and with the help of phentermine , has lost an avg of 4 pounds per month, which is wonderful. She has also started exercising, but needs to commit to atr least 150 mins per [facility] still. Her headache frequency is don , and responds to ibuprofen       Review of Systems Denies recent fever or chills. Denies sinus pressure, nasal congestion, ear pain or sore throat. Denies chest congestion, productive cough or wheezing. Denies chest pains, palpitations, paroxysmal nocturnal dyspnea, orthopnea and leg swelling Denies abdominal pain, nausea, vomiting,diarrhea or constipation.  Denies rectal bleeding or change in bowel movement. Denies dysuria, frequency, hesitancy or incontinence. Denies joint pain, swelling and limitation and mobility. Denies seizure, numbness, or tingling. Denies depression, anxiety or insomnia. Denies skin break down or rash.        Objective:   Physical Exam Patient alert and oriented and in no Cardiopulmonary distress.  HEENT: No facial asymmetry, EOMI, no sinus tenderness, TM's clear, Oropharynx pink and moist.  Neck supple no adenopathy.  Chest: Clear to auscultation bilaterally.  CVS: S1, S2 no murmurs, no S3.  ABD: Soft non tender. Bowel sounds normal.  Ext: No edema  MS: Adequate ROM spine, shoulders, hips and knees.  Skin: Intact, no ulcerations or rash noted.  Psych: Good eye contact, normal affect. Memory intact not anxious or depressed appearing.  CNS: CN 2-12 intact, power, tone and sensation normal throughout.        Assessment & Plan:  1. Obesity:  improved, increase exercise and continue phentermine half tab daily 2.Headaches : controlled on current regime, no change

## 2010-09-30 ENCOUNTER — Encounter: Payer: Self-pay | Admitting: Family Medicine

## 2010-10-16 ENCOUNTER — Other Ambulatory Visit: Payer: Self-pay | Admitting: Family Medicine

## 2010-10-16 DIAGNOSIS — Z139 Encounter for screening, unspecified: Secondary | ICD-10-CM

## 2010-10-25 ENCOUNTER — Ambulatory Visit (HOSPITAL_COMMUNITY)
Admission: RE | Admit: 2010-10-25 | Discharge: 2010-10-25 | Disposition: A | Discharge status: Home / Self Care | Payer: BC Managed Care – PPO | Source: Ambulatory Visit | Attending: Family Medicine | Admitting: Family Medicine

## 2010-10-25 DIAGNOSIS — Z1231 Encounter for screening mammogram for malignant neoplasm of breast: Secondary | ICD-10-CM | POA: Insufficient documentation

## 2010-10-25 DIAGNOSIS — Z139 Encounter for screening, unspecified: Secondary | ICD-10-CM

## 2010-11-20 ENCOUNTER — Telehealth: Payer: Self-pay | Admitting: Family Medicine

## 2010-11-22 ENCOUNTER — Encounter: Payer: BC Managed Care – PPO | Admitting: Family Medicine

## 2010-11-22 NOTE — Telephone Encounter (Signed)
Called in to pharmacist at [facility]

## 2010-12-02 ENCOUNTER — Encounter: Payer: Self-pay | Admitting: Family Medicine

## 2010-12-11 ENCOUNTER — Other Ambulatory Visit (HOSPITAL_COMMUNITY)
Admission: RE | Admit: 2010-12-11 | Discharge: 2010-12-11 | Disposition: A | Discharge status: Home / Self Care | Payer: BC Managed Care – PPO | Source: Ambulatory Visit | Attending: Family Medicine | Admitting: Family Medicine

## 2010-12-11 ENCOUNTER — Encounter: Payer: Self-pay | Admitting: Family Medicine

## 2010-12-11 ENCOUNTER — Ambulatory Visit (INDEPENDENT_AMBULATORY_CARE_PROVIDER_SITE_OTHER): Payer: BC Managed Care – PPO | Admitting: Family Medicine

## 2010-12-11 VITALS — BP 136/82 | HR 84 | Resp 16 | Ht 66.75 in | Wt 188.4 lb

## 2010-12-11 DIAGNOSIS — E669 Obesity, unspecified: Secondary | ICD-10-CM

## 2010-12-11 DIAGNOSIS — Z0000 Encounter for general adult medical examination without abnormal findings: Secondary | ICD-10-CM

## 2010-12-11 DIAGNOSIS — Z23 Encounter for immunization: Secondary | ICD-10-CM

## 2010-12-11 DIAGNOSIS — Z01419 Encounter for gynecological examination (general) (routine) without abnormal findings: Secondary | ICD-10-CM | POA: Insufficient documentation

## 2010-12-11 DIAGNOSIS — N898 Other specified noninflammatory disorders of vagina: Secondary | ICD-10-CM

## 2010-12-11 DIAGNOSIS — Z1211 Encounter for screening for malignant neoplasm of colon: Secondary | ICD-10-CM

## 2010-12-11 DIAGNOSIS — N760 Acute vaginitis: Secondary | ICD-10-CM

## 2010-12-11 LAB — HEMOCCULT GUIAC POC 1CARD (OFFICE)

## 2010-12-11 MED ORDER — PHENTERMINE HCL 37.5 MG PO CAPS: 37.5000 mg | ORAL_CAPSULE | ORAL | Status: DC

## 2010-12-11 NOTE — Patient Instructions (Addendum)
F/U in 4 month  Continue half phentermine once daily. Start taking it in the evening before work.  Calorie range from 1200 to 1500 cals/day recommended  You need to aim for at least 6 hours of sleep daily.  You will get info on sleep hygiene  Weight loss goal of  6 pounds in the next 4 month  It is important that you exercise regularly at least 45 minutes 7 times a week. If you develop chest pain, have severe difficulty breathing, or feel very tired, stop exercising immediately and seek medical attention

## 2010-12-11 NOTE — Progress Notes (Signed)
  Subjective:    Patient ID: Chelsey Lewis, female    DOB: 11/10/1970, 41 y.o.   MRN: [id number]  HPI The PT is here for annual exam and re-evaluation of chronic medical conditions, medication management and review of any available recent lab and radiology data.  Preventive health is updated, specifically  Cancer screening and Immunization.    The PT denies any adverse reactions to current medications since the last visit.  There are no new concerns.  C/o malodorous vaginal d/c x 1 week. Reports attention to lifestyle change to improve health and facilitate weight loss, somewhat disappointed at slow rate of progress, actually has lost no weight since her last visit and sl;eep habits are very poor, averaging about 5 hrs daily    Review of Systems See HPI Denies recent fever or chills. Denies sinus pressure, nasal congestion, ear pain or sore throat. Denies chest congestion, productive cough or wheezing. Denies chest pains, palpitations and leg swelling Denies abdominal pain, nausea, vomiting,diarrhea or constipation.   Denies dysuria, frequency, hesitancy or incontinence. Denies joint pain, swelling and limitation in mobility. Denies headaches, seizures, numbness, or tingling. 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        Objective:   Physical Exam Pleasant well nourished female, alert and oriented x 3, in no cardio-pulmonary distress. Afebrile. HEENT No facial trauma or asymetry. Sinuses non tender.  EOMI, PERTL, fundoscopic exam is normal, no hemorhage or exudate.  External ears normal, tympanic membranes clear. Oropharynx moist, no exudate, good dentition. Neck: supple, no adenopathy,JVD or thyromegaly.No bruits.  Chest: Clear to ascultation bilaterally.No crackles or wheezes. Non tender  to palpation  Breast: No asymetry,no masses. No nipple discharge or inversion. No axillary or supraclavicular adenopathy  Cardiovascular system; Heart sounds normal,  S1 and  S2 ,no S3.  No murmur, or thrill. Apical beat not displaced Peripheral pulses normal.  Abdomen: Soft, non tender, no organomegaly or masses. No bruits. Bowel sounds normal. No guarding, tenderness or rebound.  Rectal:  No mass. Guaiac negative stool.  GU: External genitalia normal. No lesions. Vaginal canal normal.fishy discharge. Uterus normal size, no adnexal masses, no cervical motion or adnexal tenderness.  Musculoskeletal exam: Full ROM of spine, hips , shoulders and knees. No deformity ,swelling or crepitus noted. No muscle wasting or atrophy.   Neurologic: Cranial nerves 2 to 12 intact. Power, tone ,sensation and reflexes normal throughout. No disturbance in gait. No tremor.  Skin: Intact, no ulceration, erythema , scaling or rash noted. Pigmentation normal throughout  Psych; Normal mood and affect. Judgement and concentration normal        Assessment & Plan:

## 2010-12-13 ENCOUNTER — Other Ambulatory Visit: Payer: Self-pay | Admitting: Family Medicine

## 2010-12-13 LAB — GC/CHLAMYDIA PROBE AMP, GENITAL: Chlamydia, DNA Probe: NEGATIVE

## 2010-12-13 LAB — WET PREP BY MOLECULAR PROBE: Candida species: NEGATIVE

## 2010-12-13 MED ORDER — METRONIDAZOLE 500 MG PO TABS: 500.0000 mg | ORAL_TABLET | Freq: Two times a day (BID) | ORAL | Status: AC

## 2010-12-15 NOTE — Assessment & Plan Note (Signed)
Unchanged. Patient re-educated about  the importance of commitment to a  minimum of 150 minutes of exercise per week. The importance of healthy food choices with portion control discussed. Encouraged to start a food diary, count calories and to consider  joining a support group. Sample diet sheets offered. Goals set by the patient for the next several months.    

## 2010-12-15 NOTE — Assessment & Plan Note (Signed)
Specimen sent for testing, clinically appears to be BV, will await result before definitive treatment

## 2010-12-24 ENCOUNTER — Telehealth: Payer: Self-pay

## 2010-12-24 NOTE — Telephone Encounter (Signed)
Is still having some milky white discharge and she has finished all of her antibiotic. No odor or itch. Wants to know if she needs anything else?

## 2010-12-26 NOTE — Telephone Encounter (Signed)
Would suggest nothing else be done unless odor or excessive drainage

## 2011-01-02 ENCOUNTER — Other Ambulatory Visit: Payer: Self-pay | Admitting: Family Medicine

## 2011-01-02 MED ORDER — METRONIDAZOLE 500 MG PO TABS: 500.0000 mg | ORAL_TABLET | Freq: Two times a day (BID) | ORAL | Status: DC

## 2011-01-16 ENCOUNTER — Other Ambulatory Visit (INDEPENDENT_AMBULATORY_CARE_PROVIDER_SITE_OTHER): Payer: BC Managed Care – PPO

## 2011-01-16 DIAGNOSIS — Z1211 Encounter for screening for malignant neoplasm of colon: Secondary | ICD-10-CM

## 2011-01-16 LAB — HEMOCCULT GUIAC POC 1CARD (OFFICE)
Card #2 Fecal Occult Blod, POC: NEGATIVE
Card #3 Fecal Occult Blood, POC: NEGATIVE

## 2011-03-27 ENCOUNTER — Ambulatory Visit (INDEPENDENT_AMBULATORY_CARE_PROVIDER_SITE_OTHER): Payer: BC Managed Care – PPO | Admitting: Family Medicine

## 2011-03-27 ENCOUNTER — Ambulatory Visit: Payer: BC Managed Care – PPO | Admitting: Family Medicine

## 2011-03-27 ENCOUNTER — Encounter: Payer: Self-pay | Admitting: Family Medicine

## 2011-03-27 ENCOUNTER — Ambulatory Visit (HOSPITAL_COMMUNITY)
Admission: RE | Admit: 2011-03-27 | Discharge: 2011-03-27 | Disposition: A | Discharge status: Home / Self Care | Payer: BC Managed Care – PPO | Source: Ambulatory Visit | Attending: Family Medicine | Admitting: Family Medicine

## 2011-03-27 VITALS — BP 120/88 | HR 74 | Resp 15 | Ht 66.75 in | Wt 188.0 lb

## 2011-03-27 DIAGNOSIS — M79609 Pain in unspecified limb: Secondary | ICD-10-CM | POA: Insufficient documentation

## 2011-03-27 DIAGNOSIS — M79671 Pain in right foot: Secondary | ICD-10-CM

## 2011-03-27 DIAGNOSIS — E785 Hyperlipidemia, unspecified: Secondary | ICD-10-CM

## 2011-03-27 DIAGNOSIS — E669 Obesity, unspecified: Secondary | ICD-10-CM

## 2011-03-27 MED ORDER — PHENTERMINE HCL 37.5 MG PO CAPS: 37.5000 mg | ORAL_CAPSULE | ORAL | Status: DC

## 2011-03-27 MED ORDER — IBUPROFEN 800 MG PO TABS: 800.0000 mg | ORAL_TABLET | Freq: Three times a day (TID) | ORAL | Status: DC | PRN

## 2011-03-27 MED ORDER — PREDNISONE (PAK) 5 MG PO TABS: 5.0000 mg | ORAL_TABLET | ORAL | Status: DC

## 2011-03-27 NOTE — Progress Notes (Signed)
  Subjective:    Patient ID: Chelsey Lewis, female    DOB: May 10, 1970, 41 y.o.   MRN: [id number]  HPI 2 day h/o foot pain which kept pt uncomfortable all night . Concerned about weight and wants to resume phentermine to assist with weight loss Review of Systems See HPI Denies recent fever or chills. Denies sinus pressure, nasal congestion, ear pain or sore throat. Denies chest congestion, productive cough or wheezing.  Denies joint pain, swelling and limitation in mobility. Denies headaches, seizures, numbness, or tingling. Denies depression, anxiety or insomnia. Denies skin break down or rash.       Objective:   Physical Exam  Patient alert and oriented and in no cardiopulmonary distress.  HEENT: No facial asymmetry, EOMI, no sinus tenderness,  oropharynx pink and moist.  Neck supple no adenopathy.  Chest: Clear to auscultation bilaterally.  CVS: S1, S2 no murmurs, no S3.  ABD: Soft non tender. Bowel sounds normal.  Ext: No edema  MS: Adequate ROM spine, shoulders, hips and knees.Tendr of right  heel  Skin: Intact, no ulcerations or rash noted.  Psych: Good eye contact, normal affect. Memory intact not anxious or depressed appearing.  CNS: CN 2-12 intact, power, tone and sensation normal throughout.       Assessment & Plan:

## 2011-03-27 NOTE — Patient Instructions (Signed)
F/U in 4 month   You absolutely need to start losing weight with dietary change and exercise.  If you do not lose at least 2 to 3 pounds per month, you will not continue to be prescribed phentermine.   You are having right foot pain likely due heel pain, medication has been sent in.  Work excuse for 3/14 and 03/28/2011

## 2011-03-28 ENCOUNTER — Ambulatory Visit: Payer: BC Managed Care – PPO | Admitting: Family Medicine

## 2011-04-06 NOTE — Assessment & Plan Note (Signed)
Deteriorated. Patient re-educated about  the importance of commitment to a  minimum of 150 minutes of exercise per week. The importance of healthy food choices with portion control discussed. Encouraged to start a food diary, count calories and to consider  joining a support group. Sample diet sheets offered. Goals set by the patient for the next several months.    

## 2011-04-06 NOTE — Assessment & Plan Note (Signed)
Hyperlipidemia:Low fat diet discussed and encouraged.   

## 2011-04-06 NOTE — Assessment & Plan Note (Signed)
Acute foot pain, anti inflammatories prescribed.

## 2011-04-10 ENCOUNTER — Ambulatory Visit: Payer: BC Managed Care – PPO | Admitting: Family Medicine

## 2011-04-17 HISTORY — PX: OTHER SURGICAL HISTORY: SHX169

## 2011-05-01 ENCOUNTER — Telehealth: Payer: Self-pay | Admitting: Family Medicine

## 2011-05-01 MED ORDER — PHENTERMINE HCL 37.5 MG PO CAPS: 37.5000 mg | ORAL_CAPSULE | ORAL | Status: DC

## 2011-05-01 NOTE — Telephone Encounter (Signed)
Printed for Dr to sign  

## 2011-06-06 ENCOUNTER — Other Ambulatory Visit: Payer: Self-pay | Admitting: Family Medicine

## 2011-06-13 ENCOUNTER — Other Ambulatory Visit: Payer: Self-pay | Admitting: Family Medicine

## 2011-07-31 ENCOUNTER — Ambulatory Visit: Payer: BC Managed Care – PPO | Admitting: Family Medicine

## 2011-08-14 ENCOUNTER — Ambulatory Visit (INDEPENDENT_AMBULATORY_CARE_PROVIDER_SITE_OTHER): Payer: BC Managed Care – PPO | Admitting: Family Medicine

## 2011-08-14 ENCOUNTER — Encounter: Payer: Self-pay | Admitting: Family Medicine

## 2011-08-14 VITALS — BP 120/90 | HR 80 | Resp 15 | Ht 66.75 in | Wt 194.4 lb

## 2011-08-14 DIAGNOSIS — D649 Anemia, unspecified: Secondary | ICD-10-CM

## 2011-08-14 DIAGNOSIS — R5381 Other malaise: Secondary | ICD-10-CM

## 2011-08-14 DIAGNOSIS — R7301 Impaired fasting glucose: Secondary | ICD-10-CM

## 2011-08-14 DIAGNOSIS — R5383 Other fatigue: Secondary | ICD-10-CM

## 2011-08-14 DIAGNOSIS — E785 Hyperlipidemia, unspecified: Secondary | ICD-10-CM

## 2011-08-14 DIAGNOSIS — E669 Obesity, unspecified: Secondary | ICD-10-CM

## 2011-08-14 DIAGNOSIS — M949 Disorder of cartilage, unspecified: Secondary | ICD-10-CM

## 2011-08-14 DIAGNOSIS — M899 Disorder of bone, unspecified: Secondary | ICD-10-CM

## 2011-08-14 MED ORDER — PHENTERMINE HCL 37.5 MG PO TABS: 37.5000 mg | ORAL_TABLET | Freq: Every day | ORAL | Status: DC

## 2011-08-14 NOTE — Patient Instructions (Signed)
CPE in December.  Mammogram due in October please schedule  Fasting lipid, chem 7, tSH, hBA1c, Vit d, cBC early dec before the visit  Sorry about the accident, glad you are doing better  A healthy diet is rich in fruit, vegetables and whole grains. Poultry fish, nuts and beans are a healthy choice for protein rather then red meat. A low sodium diet and drinking 64 ounces of water daily is generally recommended. Oils and sweet should be limited. Carbohydrates especially for those who are diabetic or overweight, should be limited to 30-45 gram per meal. It is important to eat on a regular schedule, at least 3 times daily. Snacks should be primarily fruits, vegetables or nuts.  It is important that you exercise regularly at least 45 minutes 7 times a week. If you develop chest pain, have severe difficulty breathing, or feel very tired, stop exercising immediately and seek medical attention   Weight loss goal of 3 to 4 pounds per month  Half phentermine daily  pls follow a low fat diet

## 2011-08-17 NOTE — Progress Notes (Signed)
  Subjective:    Patient ID: Chelsey Lewis, female    DOB: 1970/03/18, 41 y.o.   MRN: [id number]  HPI The PT is here for follow up and re-evaluation of chronic medical conditions, medication management and review of any available recent lab and radiology data.  Preventive health is updated, specifically  Cancer screening and Immunization.   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 Denies sinus pressure, nasal congestion, ear pain or sore throat. Denies chest congestion, productive cough or wheezing. Denies chest pains, palpitations and leg swelling Denies abdominal pain, nausea, vomiting,diarrhea or constipation.   Denies dysuria, frequency, hesitancy or incontinence.  Denies headaches, seizures, numbness, or tingling. Denies depression, anxiety or insomnia. Denies skin break down or rash.        Objective:   Physical Exam  Patient alert and oriented and in no cardiopulmonary distress.  HEENT: No facial asymmetry, EOMI, no sinus tenderness,  oropharynx pink and moist.  Neck supple no adenopathy.  Chest: Clear to auscultation bilaterally.  CVS: S1, S2 no murmurs, no S3.  ABD: Soft non tender. Bowel sounds normal.  Ext: No edema  MS: Adequate ROM spine, shoulders, hips and knees.  Skin: Intact, no ulcerations or rash noted.  Psych: Good eye contact, normal affect. Memory intact not anxious or depressed appearing.  CNS: CN 2-12 intact, power, tone and sensation normal throughout.       Assessment & Plan:

## 2011-08-17 NOTE — Assessment & Plan Note (Signed)
Deteriorated. Patient re-educated about  the importance of commitment to a  minimum of 150 minutes of exercise per week. The importance of healthy food choices with portion control discussed. Encouraged to start a food diary, count calories and to consider  joining a support group. Sample diet sheets offered. Goals set by the patient for the next several months.    

## 2011-08-17 NOTE — Assessment & Plan Note (Signed)
Patient educated about the importance of limiting  Carbohydrate intake , the need to commit to daily physical activity for a minimum of 30 minutes , and to commit weight loss. The fact that changes in all these areas will reduce or eliminate all together the development of diabetes is stressed.    

## 2011-08-17 NOTE — Assessment & Plan Note (Signed)
Hyperlipidemia:Low fat diet discussed and encouraged.  No med at this time.  Updated labs before next visit

## 2011-10-14 ENCOUNTER — Other Ambulatory Visit: Payer: Self-pay | Admitting: Family Medicine

## 2011-10-14 DIAGNOSIS — Z139 Encounter for screening, unspecified: Secondary | ICD-10-CM

## 2011-10-28 ENCOUNTER — Ambulatory Visit (HOSPITAL_COMMUNITY): Payer: BC Managed Care – PPO

## 2011-11-03 ENCOUNTER — Ambulatory Visit (HOSPITAL_COMMUNITY)
Admission: RE | Admit: 2011-11-03 | Discharge: 2011-11-03 | Disposition: A | Discharge status: Home / Self Care | Payer: BC Managed Care – PPO | Source: Ambulatory Visit | Attending: Family Medicine | Admitting: Family Medicine

## 2011-11-03 DIAGNOSIS — Z139 Encounter for screening, unspecified: Secondary | ICD-10-CM

## 2011-11-03 DIAGNOSIS — Z1231 Encounter for screening mammogram for malignant neoplasm of breast: Secondary | ICD-10-CM | POA: Insufficient documentation

## 2011-12-23 ENCOUNTER — Other Ambulatory Visit: Payer: Self-pay | Admitting: Family Medicine

## 2011-12-24 ENCOUNTER — Ambulatory Visit (INDEPENDENT_AMBULATORY_CARE_PROVIDER_SITE_OTHER): Payer: BC Managed Care – PPO | Admitting: Family Medicine

## 2011-12-24 ENCOUNTER — Encounter: Payer: Self-pay | Admitting: Family Medicine

## 2011-12-24 ENCOUNTER — Other Ambulatory Visit (HOSPITAL_COMMUNITY)
Admission: RE | Admit: 2011-12-24 | Discharge: 2011-12-24 | Disposition: A | Discharge status: Home / Self Care | Payer: BC Managed Care – PPO | Source: Ambulatory Visit | Attending: Family Medicine | Admitting: Family Medicine

## 2011-12-24 VITALS — BP 136/82 | HR 77 | Resp 18 | Ht 66.75 in | Wt 192.0 lb

## 2011-12-24 DIAGNOSIS — G4700 Insomnia, unspecified: Secondary | ICD-10-CM | POA: Insufficient documentation

## 2011-12-24 DIAGNOSIS — Z1151 Encounter for screening for human papillomavirus (HPV): Secondary | ICD-10-CM | POA: Insufficient documentation

## 2011-12-24 DIAGNOSIS — R7301 Impaired fasting glucose: Secondary | ICD-10-CM

## 2011-12-24 DIAGNOSIS — Z1211 Encounter for screening for malignant neoplasm of colon: Secondary | ICD-10-CM

## 2011-12-24 DIAGNOSIS — Z113 Encounter for screening for infections with a predominantly sexual mode of transmission: Secondary | ICD-10-CM | POA: Insufficient documentation

## 2011-12-24 DIAGNOSIS — E669 Obesity, unspecified: Secondary | ICD-10-CM

## 2011-12-24 DIAGNOSIS — Z0000 Encounter for general adult medical examination without abnormal findings: Secondary | ICD-10-CM

## 2011-12-24 DIAGNOSIS — N760 Acute vaginitis: Secondary | ICD-10-CM

## 2011-12-24 DIAGNOSIS — Z01419 Encounter for gynecological examination (general) (routine) without abnormal findings: Secondary | ICD-10-CM | POA: Insufficient documentation

## 2011-12-24 LAB — BASIC METABOLIC PANEL
Chloride: 106 mEq/L (ref 96–112)
Creat: 1.06 mg/dL (ref 0.50–1.10)
Potassium: 3.8 mEq/L (ref 3.5–5.3)

## 2011-12-24 LAB — CBC WITH DIFFERENTIAL/PLATELET
Basophils Absolute: 0 10*3/uL (ref 0.0–0.1)
Basophils Relative: 0 % (ref 0–1)
Lymphocytes Relative: 38 % (ref 12–46)
MCHC: 32.5 g/dL (ref 30.0–36.0)
Neutro Abs: 3.4 10*3/uL (ref 1.7–7.7)
Platelets: 425 10*3/uL — ABNORMAL HIGH (ref 150–400)
RDW: 15.9 % — ABNORMAL HIGH (ref 11.5–15.5)
WBC: 6.7 10*3/uL (ref 4.0–10.5)

## 2011-12-24 LAB — VITAMIN D 25 HYDROXY (VIT D DEFICIENCY, FRACTURES): Vit D, 25-Hydroxy: 14 ng/mL — ABNORMAL LOW (ref 30–89)

## 2011-12-24 LAB — LIPID PANEL
Cholesterol: 177 mg/dL (ref 0–200)
HDL: 43 mg/dL (ref >39)
LDL Cholesterol: 123 mg/dL — ABNORMAL HIGH (ref 0–99)
Triglycerides: 53 mg/dL (ref <150)

## 2011-12-24 LAB — HEMOGLOBIN A1C: Mean Plasma Glucose: 117 mg/dL — ABNORMAL HIGH (ref <117)

## 2011-12-24 MED ORDER — PHENTERMINE HCL 37.5 MG PO TBDP: 1.0000 | ORAL_TABLET | Freq: Every day | ORAL | Status: DC

## 2011-12-24 MED ORDER — TEMAZEPAM 15 MG PO CAPS: 15.0000 mg | ORAL_CAPSULE | Freq: Every evening | ORAL | Status: DC | PRN

## 2011-12-24 NOTE — Patient Instructions (Addendum)
F/u in 4 month.Ne w med for insomnia, if not effective call at week 3 for higher dose.   Goal of 8 to 12 pound weight loss.Continue half phentermine daily  New med for sleep take daily, call if not effective cPlease practice good sleep hygiene   hBA1C in 4 months.cbc   Please cut back on fried and fatty foods and red meat  Specimen will be sent for testing for bacterial infection  It is important that you exercise regularly at least 30 minutes 5 times a week. If you develop chest pain, have severe difficulty breathing, or feel very tired, stop exercising immediately and seek medical attention  A healthy diet is rich in fruit, vegetables and whole grains. Poultry fish, nuts and beans are a healthy choice for protein rather then red meat. A low sodium diet and drinking 64 ounces of water daily is generally recommended. Oils and sweet should be limited. Carbohydrates especially for those who are diabetic or overweight, should be limited to 34-45 gram per meal. It is important to eat on a regular schedule, at least 3 times daily. Snacks should be primarily fruits, vegetables or nuts.   Take one multivitamin once daily

## 2011-12-24 NOTE — Progress Notes (Signed)
  Subjective:    Patient ID: Chelsey Lewis, female    DOB: Jan 23, 1970, 41 y.o.   MRN: [id number]  HPI The PT is here for annual exam and re-evaluation of chronic medical conditions, medication management and review of any available recent lab and radiology data.  Preventive health is updated, specifically  Cancer screening and Immunization.   Questions or concerns regarding consultations or procedures which the PT has had in the interim are  addressed. The PT denies any adverse reactions to current medications since the last visit.  There are no new concerns.  There are no specific complaints       Review of Systems See HPI Denies recent fever or chills. Denies sinus pressure, nasal congestion, ear pain or sore throat. Denies chest congestion, productive cough or wheezing. Denies chest pains, palpitations and leg swelling Denies abdominal pain, nausea, vomiting,diarrhea or constipation.   Denies dysuria, frequency, hesitancy or incontinence.c/o vaginal d/c which has odor Denies joint pain, swelling and limitation in mobility. Denies headaches, seizures, numbness, or tingling. Denies depression or  anxiety c/o insomnia. Denies skin break down or rash.        Objective:   Physical Exam  Pleasant well nourished female, alert and oriented x 3, in no cardio-pulmonary distress. Afebrile. HEENT No facial trauma or asymetry. Sinuses non tender.  EOMI, PERTL, fundoscopic exam is normal, no hemorhage or exudate.  External ears normal, tympanic membranes clear. Oropharynx moist, no exudate, good dentition. Neck: supple, no adenopathy,JVD or thyromegaly.No bruits.  Chest: Clear to ascultation bilaterally.No crackles or wheezes. Non tender to palpation  Breast: No asymetry,no masses. No nipple discharge or inversion. No axillary or supraclavicular adenopathy  Cardiovascular system; Heart sounds normal,  S1 and  S2 ,no S3.  No murmur, or thrill. Apical beat not  displaced Peripheral pulses normal.  Abdomen: Soft, non tender, no organomegaly or masses. No bruits. Bowel sounds normal. No guarding, tenderness or rebound.  Rectal:  No mass. Guaiac negative stool.  GU: External genitalia normal. No lesions. Vaginal canal normal.white fishy  discharge. Uterus normal size, no adnexal masses, no cervical motion or adnexal tenderness.  Musculoskeletal exam: Full ROM of spine, hips , shoulders and knees. No deformity ,swelling or crepitus noted. No muscle wasting or atrophy.   Neurologic: Cranial nerves 2 to 12 intact. Power, tone ,sensation and reflexes normal throughout. No disturbance in gait. No tremor.  Skin: Intact, no ulceration, erythema , scaling or rash noted. Pigmentation normal throughout  Psych; Normal mood and affect. Judgement and concentration normal       Assessment & Plan:

## 2011-12-28 NOTE — Assessment & Plan Note (Signed)
Chronic problem, avg 4 hrs. Sleep hygiene reviewed. Med to be started

## 2011-12-28 NOTE — Assessment & Plan Note (Signed)
Pelvic breast and rectal exam documented as well as general medical exam. Counseled re importance of healthy diet and commitment to daily physical activity for good health Immunization needs to be updated, will need flu vaccine at pharmacy

## 2011-12-28 NOTE — Assessment & Plan Note (Signed)
Importance of change in diet and weight loss stressed

## 2011-12-28 NOTE — Assessment & Plan Note (Signed)
Unchanged. Patient re-educated about  the importance of commitment to a  minimum of 150 minutes of exercise per week. The importance of healthy food choices with portion control discussed. Encouraged to start a food diary, count calories and to consider  joining a support group. Sample diet sheets offered. Goals set by the patient for the next several months.    

## 2011-12-30 ENCOUNTER — Telehealth: Payer: Self-pay | Admitting: Family Medicine

## 2011-12-31 MED ORDER — METRONIDAZOLE 500 MG PO TABS: 500.0000 mg | ORAL_TABLET | Freq: Two times a day (BID) | ORAL | Status: DC

## 2011-12-31 NOTE — Telephone Encounter (Signed)
Patient made aware of results.  

## 2011-12-31 NOTE — Addendum Note (Signed)
Addended by: Kandis Fantasia B on: 12/31/2011 09:12 AM   Modules accepted: Orders

## 2012-03-10 ENCOUNTER — Emergency Department (HOSPITAL_COMMUNITY)
Admission: EM | Admit: 2012-03-10 | Discharge: 2012-03-10 | Disposition: A | Discharge status: Home / Self Care | Payer: PRIVATE HEALTH INSURANCE | Attending: Emergency Medicine | Admitting: Emergency Medicine

## 2012-03-10 ENCOUNTER — Encounter (HOSPITAL_COMMUNITY): Payer: Self-pay | Admitting: Emergency Medicine

## 2012-03-10 DIAGNOSIS — H9202 Otalgia, left ear: Secondary | ICD-10-CM

## 2012-03-10 DIAGNOSIS — R51 Headache: Secondary | ICD-10-CM | POA: Insufficient documentation

## 2012-03-10 DIAGNOSIS — H9209 Otalgia, unspecified ear: Secondary | ICD-10-CM | POA: Insufficient documentation

## 2012-03-10 DIAGNOSIS — Z8639 Personal history of other endocrine, nutritional and metabolic disease: Secondary | ICD-10-CM | POA: Insufficient documentation

## 2012-03-10 DIAGNOSIS — Z87891 Personal history of nicotine dependence: Secondary | ICD-10-CM | POA: Insufficient documentation

## 2012-03-10 DIAGNOSIS — Z8679 Personal history of other diseases of the circulatory system: Secondary | ICD-10-CM | POA: Insufficient documentation

## 2012-03-10 DIAGNOSIS — Z79899 Other long term (current) drug therapy: Secondary | ICD-10-CM | POA: Insufficient documentation

## 2012-03-10 DIAGNOSIS — Z862 Personal history of diseases of the blood and blood-forming organs and certain disorders involving the immune mechanism: Secondary | ICD-10-CM | POA: Insufficient documentation

## 2012-03-10 DIAGNOSIS — J3489 Other specified disorders of nose and nasal sinuses: Secondary | ICD-10-CM | POA: Insufficient documentation

## 2012-03-10 DIAGNOSIS — E663 Overweight: Secondary | ICD-10-CM | POA: Insufficient documentation

## 2012-03-10 MED ORDER — PSEUDOEPHEDRINE HCL 60 MG PO TABS: ORAL_TABLET | ORAL | Status: DC

## 2012-03-10 MED ORDER — PENICILLIN V POTASSIUM 500 MG PO TABS: ORAL_TABLET | ORAL | Status: DC

## 2012-03-10 MED ORDER — HYDROCODONE-ACETAMINOPHEN 5-325 MG PO TABS: ORAL_TABLET | ORAL | Status: DC

## 2012-03-10 NOTE — ED Notes (Signed)
Pt c/o left ear pain x 2 days  

## 2012-03-10 NOTE — ED Provider Notes (Signed)
History     CSN: [id number]  Arrival date & time 03/10/12  1052   None     Chief Complaint  Patient presents with  . Otalgia    (Consider location/radiation/quality/duration/timing/severity/associated sxs/prior treatment) Patient is a 42 y.o. female presenting with ear pain. The history is provided by the patient.  Otalgia Location:  Left Behind ear:  No abnormality Quality:  Aching and pressure Severity:  Moderate Onset quality:  Sudden Timing:  Intermittent Progression:  Worsening Chronicity:  New Context: not direct blow and not loud noise   Context comment:  Nasal congestion Relieved by:  Nothing Worsened by:  Nothing tried Ineffective treatments:  None tried Associated symptoms: headaches   Associated symptoms: no abdominal pain, no cough, no fever, no neck pain and no vomiting   Risk factors: no recent travel and no chronic ear infection     Past Medical History  Diagnosis Date  . Vaginal discharge   . Hyperlipidemia   . Overweight   . Well adult exam   . Tobacco abuse   . Migraine headache   . Constipation   . Tubal pregnancy   . Anemia     Past Surgical History  Procedure Laterality Date  . Oopherectomy and tubal ligation    . Ectopic pregnancy surgery    . 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 Coronary artery disease Father     with CABG   . COPD Mother   . Diabetes Mother   . Hypertension Mother   . Hypertension Sister     History  Substance Use Topics  . Smoking status: Former Games developer  . Smokeless tobacco: Not on file  . Alcohol Use: Not on file    OB History   Grav Para Term Preterm Abortions TAB SAB Ect Mult Living                  Review of Systems  Constitutional: Negative for fever and activity change.       All ROS Neg except as noted in HPI  HENT: Positive for ear pain. Negative for nosebleeds and neck pain.   Eyes:  Negative for photophobia and discharge.  Respiratory: Negative for cough, shortness of breath and wheezing.   Cardiovascular: Negative for chest pain and palpitations.  Gastrointestinal: Negative for vomiting, abdominal pain and blood in stool.  Genitourinary: Negative for dysuria, frequency and hematuria.  Musculoskeletal: Negative for back pain and arthralgias.  Skin: Negative.   Neurological: Positive for headaches. Negative for dizziness, seizures and speech difficulty.  Psychiatric/Behavioral: Negative for hallucinations and confusion.    Allergies  Review of patient's allergies indicates no known allergies.  Home Medications   Current Outpatient Rx  Name  Route  Sig  Dispense  Refill  . ibuprofen (ADVIL,MOTRIN) 800 MG tablet      TAKE 1 TABLET (800 MG TOTAL)  BY MOUTH EVERY 8 (EIGHT) HOURS AS NEEDEDFOR PAIN.   30 tablet   1   . Phentermine HCl 37.5 MG TBDP   Oral   Take 1 tablet by mouth daily.   30 tablet   1   . temazepam (RESTORIL) 15 MG capsule   Oral   Take 1 capsule (15 mg total) by mouth at bedtime as needed for sleep.   30 capsule   3   . HYDROcodone-acetaminophen (NORCO/VICODIN) 5-325 MG per tablet  1 or 2 po q4h prn pain   20 tablet   0   . penicillin v potassium (VEETID) 500 MG tablet      2 po bid with food   28 tablet   0   . pseudoephedrine (SUDAFED) 60 MG tablet      1 po tid for congestion   30 tablet   0     BP 153/86  Pulse 72  Temp(Src) 97.9 F (36.6 C) (Oral)  Resp 18  SpO2 100%  Physical Exam  Nursing note and vitals reviewed. Constitutional: She is oriented to person, place, and time. She appears well-developed and well-nourished.  Non-toxic appearance.  HENT:  Head: Normocephalic.  Right Ear: Tympanic membrane and external ear normal.  Left Ear: Tympanic membrane and external ear normal.  The external auditory canal on the right is clear. The tympanic membrane on the right is within normal limits. The extra  auditory canal on the left is normal. The tympanic membrane is intact, but there appears to be fluid behind the drum. There is no pain, redness, or swelling behind the right or left year.  Eyes: EOM and lids are normal. Pupils are equal, round, and reactive to light.  Neck: Normal range of motion. Neck supple. Carotid bruit is not present.  There is soreness of the left was posterior cervical chain. No palpable cervical nodes appreciated.  Cardiovascular: Normal rate, regular rhythm, normal heart sounds, intact distal pulses and normal pulses.   Pulmonary/Chest: Breath sounds normal. No respiratory distress.  Abdominal: Soft. Bowel sounds are normal. There is no tenderness. There is no guarding.  Musculoskeletal: Normal range of motion.  Lymphadenopathy:       Head (right side): No submandibular adenopathy present.       Head (left side): No submandibular adenopathy present.    She has no cervical adenopathy.  Neurological: She is alert and oriented to person, place, and time. She has normal strength. No cranial nerve deficit or sensory deficit. She exhibits normal muscle tone. Coordination normal.  Gait wnl.  Skin: Skin is warm and dry.  Psychiatric: She has a normal mood and affect. Her speech is normal.    ED Course  Procedures (including critical care time)  Labs Reviewed - No data to display No results found.   1. Otalgia of left ear       MDM  I have reviewed nursing notes, vital signs, and all appropriate lab and imaging results for this patient. The vital signs for this patient are within normal limits with exception of the blood pressure being 153/86. On examination there is fluid behind the drum, and some nasal congestion present. Will try Sudafed and Norco for pain. Patient is to see the ear nose and throat specialist if not improving.       Kathie Dike, [place] 03/10/12 1246

## 2012-03-10 NOTE — ED Provider Notes (Signed)
  Medical screening examination/treatment/procedure(s) were performed by non-physician practitioner and as supervising physician I was immediately available for consultation/collaboration.    Vida Roller, MD 03/10/12 [phone]

## 2012-04-21 ENCOUNTER — Ambulatory Visit: Payer: BC Managed Care – PPO | Admitting: Family Medicine

## 2012-04-27 ENCOUNTER — Other Ambulatory Visit: Payer: Self-pay | Admitting: Family Medicine

## 2012-04-27 ENCOUNTER — Telehealth: Payer: Self-pay

## 2012-04-27 MED ORDER — MEDROXYPROGESTERONE ACETATE 5 MG PO TABS: 5.0000 mg | ORAL_TABLET | Freq: Two times a day (BID) | ORAL | Status: DC

## 2012-04-27 NOTE — Telephone Encounter (Signed)
I sent in provera one twice daily for 5 days, this should stop her bleeding, pls let her know,

## 2012-04-28 NOTE — Telephone Encounter (Signed)
Called and left message notifying patient.

## 2012-05-02 ENCOUNTER — Encounter (HOSPITAL_COMMUNITY): Payer: Self-pay | Admitting: *Deleted

## 2012-05-02 ENCOUNTER — Emergency Department (HOSPITAL_COMMUNITY)
Admission: EM | Admit: 2012-05-02 | Discharge: 2012-05-02 | Disposition: A | Discharge status: Home / Self Care | Payer: PRIVATE HEALTH INSURANCE | Attending: Emergency Medicine | Admitting: Emergency Medicine

## 2012-05-02 DIAGNOSIS — Z862 Personal history of diseases of the blood and blood-forming organs and certain disorders involving the immune mechanism: Secondary | ICD-10-CM | POA: Insufficient documentation

## 2012-05-02 DIAGNOSIS — N938 Other specified abnormal uterine and vaginal bleeding: Secondary | ICD-10-CM

## 2012-05-02 DIAGNOSIS — Z8742 Personal history of other diseases of the female genital tract: Secondary | ICD-10-CM | POA: Insufficient documentation

## 2012-05-02 DIAGNOSIS — N760 Acute vaginitis: Secondary | ICD-10-CM | POA: Insufficient documentation

## 2012-05-02 DIAGNOSIS — Z8719 Personal history of other diseases of the digestive system: Secondary | ICD-10-CM | POA: Insufficient documentation

## 2012-05-02 DIAGNOSIS — Z8679 Personal history of other diseases of the circulatory system: Secondary | ICD-10-CM | POA: Insufficient documentation

## 2012-05-02 DIAGNOSIS — B9689 Other specified bacterial agents as the cause of diseases classified elsewhere: Secondary | ICD-10-CM

## 2012-05-02 DIAGNOSIS — N949 Unspecified condition associated with female genital organs and menstrual cycle: Secondary | ICD-10-CM | POA: Insufficient documentation

## 2012-05-02 DIAGNOSIS — Z87891 Personal history of nicotine dependence: Secondary | ICD-10-CM | POA: Insufficient documentation

## 2012-05-02 DIAGNOSIS — Z3202 Encounter for pregnancy test, result negative: Secondary | ICD-10-CM | POA: Insufficient documentation

## 2012-05-02 DIAGNOSIS — E663 Overweight: Secondary | ICD-10-CM | POA: Insufficient documentation

## 2012-05-02 LAB — URINALYSIS, ROUTINE W REFLEX MICROSCOPIC
Bilirubin Urine: NEGATIVE
Glucose, UA: NEGATIVE mg/dL
Hgb urine dipstick: NEGATIVE
Ketones, ur: NEGATIVE mg/dL
Nitrite: NEGATIVE
Specific Gravity, Urine: 1.02 (ref 1.005–1.030)
pH: 6 (ref 5.0–8.0)

## 2012-05-02 LAB — CBC
HCT: 35 % — ABNORMAL LOW (ref 36.0–46.0)
Hemoglobin: 11.7 g/dL — ABNORMAL LOW (ref 12.0–15.0)
MCH: 28 pg (ref 26.0–34.0)
MCV: 83.7 fL (ref 78.0–100.0)
Platelets: 400 10*3/uL (ref 150–400)
RBC: 4.18 MIL/uL (ref 3.87–5.11)
WBC: 4.9 10*3/uL (ref 4.0–10.5)

## 2012-05-02 LAB — WET PREP, GENITAL
Trich, Wet Prep: NONE SEEN
Yeast Wet Prep HPF POC: NONE SEEN

## 2012-05-02 MED ORDER — METRONIDAZOLE 500 MG PO TABS: 500.0000 mg | ORAL_TABLET | Freq: Two times a day (BID) | ORAL | Status: DC

## 2012-05-02 MED ORDER — NAPROXEN 250 MG PO TABS: 250.0000 mg | ORAL_TABLET | Freq: Two times a day (BID) | ORAL | Status: DC

## 2012-05-02 MED ORDER — KETOROLAC TROMETHAMINE 60 MG/2ML IM SOLN
60.0000 mg | Freq: Once | INTRAMUSCULAR | Status: DC
  Filled 2012-05-02: qty 2

## 2012-05-02 NOTE — ED Notes (Signed)
Pt c/o vaginal bleeding since April 4, was prescribed medroxyprogesterone this past tues or Wednesday with no change in bleeding, pt reports that she changes a tampon twice a day, bilateral abd pain,

## 2012-05-02 NOTE — ED Notes (Signed)
States that she does not need anything for pain at this point.

## 2012-05-02 NOTE — ED Provider Notes (Signed)
History     CSN: [id number]  Arrival date & time 05/02/12  1191   First MD Initiated Contact with Patient 05/02/12 [phone]      Chief Complaint  Patient presents with  . Vaginal Bleeding     HPI Pt was seen at 0740.   Per pt, c/o gradual onset and persistence of constant vaginal bleeding for the past 2 weeks. Pt states she was eval by her PMD for same, rx provera.  States the amount of bleeding has decreased from 4-5 pads/tampons per day to 2 per day.  Has been associated with lower pelvic "cramping."  Denies vaginal discharge, no dysuria/hematuria, no flank pain, no N/V/D, no fevers.      Past Medical History  Diagnosis Date  . Vaginal discharge   . Hyperlipidemia   . Overweight   . Well adult exam   . Tobacco abuse   . Migraine headache   . Constipation   . Tubal pregnancy   . Anemia     Past Surgical History  Procedure Laterality Date  . Oopherectomy and tubal ligation    . Ectopic pregnancy surgery    . 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 Coronary artery disease Father     with CABG   . COPD Mother   . Diabetes Mother   . Hypertension Mother   . Hypertension Sister     History  Substance Use Topics  . Smoking status: Former Games developer  . Smokeless tobacco: Not on file  . Alcohol Use: No    Review of Systems ROS: Statement: All systems negative except as marked or noted in the HPI; Constitutional: Negative for fever and chills. ; ; Eyes: Negative for eye pain, redness and discharge. ; ; ENMT: Negative for ear pain, hoarseness, nasal congestion, sinus pressure and sore throat. ; ; Cardiovascular: Negative for chest pain, palpitations, diaphoresis, dyspnea and peripheral edema. ; ; Respiratory: Negative for cough, wheezing and stridor. ; ; Gastrointestinal: Negative for nausea, vomiting, diarrhea, abdominal pain, blood in stool, hematemesis, jaundice and rectal  bleeding. . ; ; Genitourinary: Negative for dysuria, flank pain and hematuria. ; ; GYN:  +vaginal bleeding, pelvic "pain." No vaginal discharge, no vulvar pain.;; Musculoskeletal: Negative for back pain and neck pain. Negative for swelling and trauma.; ; Skin: Negative for pruritus, rash, abrasions, blisters, bruising and skin lesion.; ; Neuro: Negative for headache, lightheadedness and neck stiffness. Negative for weakness, altered level of consciousness , altered mental status, extremity weakness, paresthesias, involuntary movement, seizure and syncope.       Allergies  Review of patient's allergies indicates no known allergies.  Home Medications   Current Outpatient Rx  Name  Route  Sig  Dispense  Refill  . HYDROcodone-acetaminophen (NORCO/VICODIN) 5-325 MG per tablet      1 or 2 po q4h prn pain   20 tablet   0   . ibuprofen (ADVIL,MOTRIN) 800 MG tablet      TAKE 1 TABLET (800 MG TOTAL)  BY MOUTH EVERY 8 (EIGHT) HOURS AS NEEDEDFOR PAIN.   30 tablet   1   . medroxyPROGESTERone (PROVERA) 5 MG tablet   Oral   Take 1 tablet (5 mg total) by mouth 2 (two) times daily.   10 tablet   0   . penicillin v potassium (VEETID) 500 MG tablet      2 po  bid with food   28 tablet   0   . Phentermine HCl 37.5 MG TBDP   Oral   Take 1 tablet by mouth daily.   30 tablet   1   . pseudoephedrine (SUDAFED) 60 MG tablet      1 po tid for congestion   30 tablet   0   . temazepam (RESTORIL) 15 MG capsule   Oral   Take 1 capsule (15 mg total) by mouth at bedtime as needed for sleep.   30 capsule   3     BP 119/81  Pulse 86  Temp(Src) 98.7 F (37.1 C)  Resp 18  Ht 5\' 6"  (1.676 m)  Wt 196 lb (88.905 kg)  BMI 31.65 kg/m2  SpO2 100%  LMP 04/16/2012  Physical Exam 0745: Physical examination:  Nursing notes reviewed; Vital signs and O2 SAT reviewed;  Constitutional: Well developed, Well nourished, Well hydrated, In no acute distress; Head:  Normocephalic, atraumatic; Eyes: EOMI,  PERRL, No scleral icterus; ENMT: Mouth and pharynx normal, Mucous membranes moist; Neck: Supple, Full range of motion, No lymphadenopathy; Cardiovascular: Regular rate and rhythm, No murmur, rub, or gallop; Respiratory: Breath sounds clear & equal bilaterally, No rales, rhonchi, wheezes.  Speaking full sentences with ease, Normal respiratory effort/excursion; Chest: Nontender, Movement normal; Abdomen: Soft, +mild suprapubic tenderness to palp. No rebound or guarding. Nondistended, Normal bowel sounds; Genitourinary: No CVA tenderness. Pelvic exam performed with permission of pt and female ED RN assist during exam.  External genitalia w/o lesions. Vaginal vault without discharge, scant amount of dark brown blood in vault.  Cervix w/o lesions, not friable, GC/chlam and wet prep obtained and sent to lab.  Bimanual exam w/o CMT, uterine or adnexal tenderness.;; Extremities: Pulses normal, No tenderness, No edema, No calf edema or asymmetry.; Neuro: AA&Ox3, Major CN grossly intact.  Speech clear. No gross focal motor or sensory deficits in extremities.; Skin: Color normal, Warm, Dry.   ED Course  Procedures    MDM  MDM Reviewed: previous chart, nursing note and vitals Interpretation: labs   Results for orders placed during the hospital encounter of 05/02/12  WET PREP, GENITAL      Result Value Range   Yeast Wet Prep HPF POC NONE SEEN  NONE SEEN   Trich, Wet Prep NONE SEEN  NONE SEEN   Clue Cells Wet Prep HPF POC RARE (*) NONE SEEN   WBC, Wet Prep HPF POC NONE SEEN  NONE SEEN  URINALYSIS, ROUTINE W REFLEX MICROSCOPIC      Result Value Range   Color, Urine YELLOW  YELLOW   APPearance CLEAR  CLEAR   Specific Gravity, Urine 1.020  1.005 - 1.030   pH 6.0  5.0 - 8.0   Glucose, UA NEGATIVE  NEGATIVE mg/dL   Hgb urine dipstick NEGATIVE  NEGATIVE   Bilirubin Urine NEGATIVE  NEGATIVE   Ketones, ur NEGATIVE  NEGATIVE mg/dL   Protein, ur NEGATIVE  NEGATIVE mg/dL   Urobilinogen, UA 0.2  0.0 - 1.0 mg/dL    Nitrite NEGATIVE  NEGATIVE   Leukocytes, UA NEGATIVE  NEGATIVE  PREGNANCY, URINE      Result Value Range   Preg Test, Ur NEGATIVE  NEGATIVE  CBC      Result Value Range   WBC 4.9  4.0 - 10.5 K/uL   RBC 4.18  3.87 - 5.11 MIL/uL   Hemoglobin 11.7 (*) 12.0 - 15.0 g/dL   HCT 16.1 (*) 09.6 - 04.5 %   MCV  83.7  78.0 - 100.0 fL   MCH 28.0  26.0 - 34.0 pg   MCHC 33.4  30.0 - 36.0 g/dL   RDW 16.1  09.6 - 04.5 %   Platelets 400  150 - 400 K/uL   Results for NATIYA, SEELINGER (MRN [id number]) as of 05/02/2012 08:53  Ref. Range 12/23/2011 09:20 05/02/2012 07:35  Hemoglobin Latest Range: 12.0-15.0 g/dL 78.2 (L) 95.6 (L)  HCT Latest Range: 36.0-46.0 % 34.8 (L) 35.0 (L)    0850:  Very scant old blood in vaginal vault on exam.  H/H stable.  +BV, will tx.  GC/chlam pending.  Pt encouraged to f/u with OB/GYN MD. Bluford Main better after toradol and wants to go home now. Dx and testing d/w pt.  Questions answered.  Verb understanding, agreeable to d/c home with outpt f/u.            Laray Anger, DO 05/06/12 1636

## 2012-05-04 LAB — GC/CHLAMYDIA PROBE AMP: GC Probe RNA: NEGATIVE

## 2012-05-05 ENCOUNTER — Ambulatory Visit (INDEPENDENT_AMBULATORY_CARE_PROVIDER_SITE_OTHER): Payer: PRIVATE HEALTH INSURANCE | Admitting: Adult Health

## 2012-05-05 ENCOUNTER — Encounter: Payer: Self-pay | Admitting: Adult Health

## 2012-05-05 VITALS — BP 126/78 | HR 78 | Ht 66.0 in | Wt 198.0 lb

## 2012-05-05 DIAGNOSIS — E669 Obesity, unspecified: Secondary | ICD-10-CM

## 2012-05-05 DIAGNOSIS — N938 Other specified abnormal uterine and vaginal bleeding: Secondary | ICD-10-CM

## 2012-05-05 DIAGNOSIS — D649 Anemia, unspecified: Secondary | ICD-10-CM

## 2012-05-05 DIAGNOSIS — N949 Unspecified condition associated with female genital organs and menstrual cycle: Secondary | ICD-10-CM

## 2012-05-05 HISTORY — DX: Other specified abnormal uterine and vaginal bleeding: N93.8

## 2012-05-05 LAB — LIPID PANEL
HDL: 33 mg/dL — ABNORMAL LOW (ref >39)
LDL Cholesterol: 60 mg/dL (ref 0–99)
Triglycerides: 72 mg/dL (ref <150)

## 2012-05-05 LAB — COMPREHENSIVE METABOLIC PANEL
AST: 20 U/L (ref 0–37)
Albumin: 4.2 g/dL (ref 3.5–5.2)
BUN: 13 mg/dL (ref 6–23)
CO2: 30 mEq/L (ref 19–32)
Calcium: 9.2 mg/dL (ref 8.4–10.5)
Chloride: 105 mEq/L (ref 96–112)
Creat: 1.04 mg/dL (ref 0.50–1.10)
Glucose, Bld: 72 mg/dL (ref 70–99)
Potassium: 4.1 mEq/L (ref 3.5–5.3)

## 2012-05-05 NOTE — Progress Notes (Signed)
Subjective:     Patient ID: Chelsey Lewis, female   DOB: [birth year], 42 y.o.   MRN: [id number]  HPI Chelsey Lewis is a 42 year old black female, married, in for follow up ER visit 05/02/12.She has been bleeding for 2.5 weeks.She took provera and bleeding slowed. She says she had been having normal periods til March and she skipped it and started bleeding 04/16/12 and has been on since. Hgb. Was 11.7 in ER and she was treated for BV with flagyl.She complained of fatigue and had hot flashes in March.She had a normal pap in November with Dr. Lodema Hong.  Review of Systems Patient denies any headaches, blurred vision, shortness of breath, chest pain, abdominal pain, problems with bowel movements, urination, or intercourse. No mood changes. Positive complaints as above in HPI. Reviewed past medical,surgical,social,and family history. Reviewed medication and allergies.    Objective:   Physical Exam Blood pressure 126/78, pulse 78, height 5\' 6"  (1.676 m), weight 198 lb (89.812 kg), last menstrual period 04/16/2012.   Skin warm and dry Neck:mid line trachea, normal thyroid Lungs: clear to ausculation bilaterally Cardiovascular: regular rate and rhythm Pelvic: external genitalia normal in appearance, vagina normal in appearance except for blood, no odor noted, cervix smooth and bulbous with -CMT, GC/CHL performed, uterus felt to be NSSC, non tender, no adnexal masses or tenderness noted. Assessment:      DUB Anemia Obesity    Plan:      Check CMP,TSH,FSH,LIPIDS GC/CHL performed Take multivitamin with iron 1 daily Keep period calendar Follow up 1 week Note given to return to week 05/06/12

## 2012-05-05 NOTE — Patient Instructions (Addendum)
Take multi vitamin with iron 1 daily Keep calendar of period  Will talk after labs back and follow up 1 week Sign up for my chart

## 2012-05-05 NOTE — Addendum Note (Signed)
Addended by: Richardson Chiquito on: 05/05/2012 10:04 AM   Modules accepted: Orders

## 2012-05-06 LAB — FOLLICLE STIMULATING HORMONE: FSH: 30.5 m[IU]/mL

## 2012-05-07 ENCOUNTER — Telehealth: Payer: Self-pay | Admitting: Adult Health

## 2012-05-07 NOTE — Telephone Encounter (Signed)
Called pt. Regarding her labs performed 05/05/12. FSH was 30.5, TSH 2.098. GC/CHL both negative, reviewed CMP and lipid profile with her. She has [place] appt. Next week will talk after scan.

## 2012-05-12 ENCOUNTER — Encounter: Payer: Self-pay | Admitting: Adult Health

## 2012-05-12 ENCOUNTER — Ambulatory Visit (INDEPENDENT_AMBULATORY_CARE_PROVIDER_SITE_OTHER): Payer: PRIVATE HEALTH INSURANCE | Admitting: Adult Health

## 2012-05-12 VITALS — BP 112/70 | Ht 66.0 in | Wt 202.0 lb

## 2012-05-12 DIAGNOSIS — D649 Anemia, unspecified: Secondary | ICD-10-CM

## 2012-05-12 DIAGNOSIS — N951 Menopausal and female climacteric states: Secondary | ICD-10-CM

## 2012-05-12 DIAGNOSIS — N938 Other specified abnormal uterine and vaginal bleeding: Secondary | ICD-10-CM

## 2012-05-12 DIAGNOSIS — N949 Unspecified condition associated with female genital organs and menstrual cycle: Secondary | ICD-10-CM

## 2012-05-12 HISTORY — DX: Menopausal and female climacteric states: N95.1

## 2012-05-12 NOTE — Progress Notes (Signed)
Subjective:     Patient ID: Chelsey Lewis, female   DOB: 1970-01-25, 42 y.o.   MRN: [id number]  HPI Ahni is back in follow up of DUB and anemia   Review of SystemsBleeding stopped 4/27.Occasional hot flash and decrease in libido    Reviewed past medical,surgical, social and family history. Reviewed medications and allergies.  Objective:   Physical Exam Blood pressure 112/70, height 5\' 6"  (1.676 m), weight 202 lb (91.627 kg), last menstrual period 04/16/2012.Talk only. Reviewed labs again, [facility] 30.5 discussed this shows ovaries not working as they should and that they can vary for a while.GC/CHL negative,TSH 2.098.CMP normal HDL 33 increase exercise.  Discussed lo dose birth control pills, HRT and endo ablation, as possible options, if symptoms persist.     Assessment:      Resolved DUB Anemia Peri menopausal    Plan:      Keep period calendar and hot flash calendar Increase sexual activity Follow up in 4 weeks to review

## 2012-05-12 NOTE — Patient Instructions (Addendum)
Keep period calendar and hot flashes Have more sex  Have iron Follow up in 4 weeks

## 2012-05-13 ENCOUNTER — Ambulatory Visit: Payer: PRIVATE HEALTH INSURANCE | Admitting: Family Medicine

## 2012-05-24 ENCOUNTER — Encounter: Payer: PRIVATE HEALTH INSURANCE | Admitting: Obstetrics and Gynecology

## 2012-05-27 ENCOUNTER — Ambulatory Visit (INDEPENDENT_AMBULATORY_CARE_PROVIDER_SITE_OTHER): Payer: PRIVATE HEALTH INSURANCE | Admitting: Family Medicine

## 2012-05-27 ENCOUNTER — Encounter: Payer: Self-pay | Admitting: Family Medicine

## 2012-05-27 VITALS — BP 130/80 | HR 76 | Resp 18 | Ht 66.75 in | Wt 198.1 lb

## 2012-05-27 DIAGNOSIS — R7309 Other abnormal glucose: Secondary | ICD-10-CM

## 2012-05-27 DIAGNOSIS — F329 Major depressive disorder, single episode, unspecified: Secondary | ICD-10-CM

## 2012-05-27 DIAGNOSIS — E669 Obesity, unspecified: Secondary | ICD-10-CM

## 2012-05-27 DIAGNOSIS — R7303 Prediabetes: Secondary | ICD-10-CM

## 2012-05-27 MED ORDER — FLUOXETINE HCL 10 MG PO CAPS: 10.0000 mg | ORAL_CAPSULE | Freq: Every day | ORAL | Status: DC

## 2012-05-27 NOTE — Patient Instructions (Addendum)
F/u in 10 weeks, call if you need me before.  I am sorry to learn of your many losses.  Please do move forward with commitment to exercise at the gym, also start fluoxetine one daily for depression.  HBA1C in 10 weeks before follow up

## 2012-05-27 NOTE — Progress Notes (Signed)
  Subjective:    Patient ID: Chelsey Lewis, female    DOB: 1970-10-04, 42 y.o.   MRN: [id number]  HPI Pt in for f/u with main c/o inceased fatigue and depression since this yea. She reports this has been a bad year. Has had multiple losses feels overwhelmed financially also.  Not suicidal or homicidal, poor concentration and feels overwhelmed. No hallucinations   Review of Systems See HPI Denies recent fever or chills. Denies sinus pressure, nasal congestion, ear pain or sore throat. Denies chest congestion, productive cough or wheezing. Denies chest pains, palpitations and leg swelling Denies abdominal pain, nausea, vomiting,diarrhea or constipation.   Denies dysuria, frequency, hesitancy or incontinence. Denies joint pain, swelling and limitation in mobility. Denies headaches, seizures, numbness, or tingling.  Denies skin break down or rash.        Objective:   Physical Exam Patient alert and oriented and in no cardiopulmonary distress.  HEENT: No facial asymmetry, EOMI, no sinus tenderness,  oropharynx pink and moist.  Neck supple no adenopathy.  Chest: Clear to auscultation bilaterally.  CVS: S1, S2 no murmurs, no S3.  ABD: Soft non tender. Bowel sounds normal.  Ext: No edema  MS: Adequate ROM spine, shoulders, hips and knees.  Skin: Intact, no ulcerations or rash noted.  Psych: Good eye contact, flat affect. Memory intact , depressed appearing.  CNS: CN 2-12 intact, power, tone and sensation normal throughout.        Assessment & Plan:

## 2012-05-31 NOTE — Assessment & Plan Note (Signed)
Deteriorated. Patient re-educated about  the importance of commitment to a  minimum of 150 minutes of exercise per week. The importance of healthy food choices with portion control discussed. Encouraged to start a food diary, count calories and to consider  joining a support group. Sample diet sheets offered. Goals set by the patient for the next several months.    

## 2012-05-31 NOTE — Assessment & Plan Note (Signed)
Uncontrolled, pt to start fluoxetine Pt to commit to daily exercise. Declines therapy at this time

## 2012-05-31 NOTE — Assessment & Plan Note (Signed)
Patient educated about the importance of limiting  Carbohydrate intake , the need to commit to daily physical activity for a minimum of 30 minutes , and to commit weight loss. The fact that changes in all these areas will reduce or eliminate all together the development of diabetes is stressed.   Updated lab needed 

## 2012-06-09 ENCOUNTER — Encounter: Payer: Self-pay | Admitting: Adult Health

## 2012-06-09 ENCOUNTER — Ambulatory Visit (INDEPENDENT_AMBULATORY_CARE_PROVIDER_SITE_OTHER): Payer: PRIVATE HEALTH INSURANCE | Admitting: Adult Health

## 2012-06-09 VITALS — BP 110/78 | Ht 66.0 in | Wt 202.0 lb

## 2012-06-09 DIAGNOSIS — N951 Menopausal and female climacteric states: Secondary | ICD-10-CM

## 2012-06-09 NOTE — Progress Notes (Signed)
Subjective:     Patient ID: Chelsey Lewis, female   DOB: April 14, 1970, 42 y.o.   MRN: [id number]  HPI Chelsey Lewis is back today to review her period calendar.She had a period from 5/18 to 5/25.She says her hot flashes are not has bad and she feels better.  Review of Systems No complaints Reviewed past medical,surgical, social and family history. Reviewed medications and allergies.     Objective:   Physical Exam Blood pressure 110/78, height 5\' 6"  (1.676 m), weight 202 lb (91.627 kg), last menstrual period 05/30/2012. Talk only. Reviewed period calendar and discussed hot flashes and if she wanted any intervention at present and she chooses to wait and follow for now.    Assessment:      Perimenopausal    Resolved DUB Plan:      Keep period calendar Call me with any problems or questions

## 2012-06-09 NOTE — Patient Instructions (Addendum)
Follow up prn

## 2012-06-15 ENCOUNTER — Encounter: Payer: Self-pay | Admitting: Family Medicine

## 2012-06-15 ENCOUNTER — Ambulatory Visit (INDEPENDENT_AMBULATORY_CARE_PROVIDER_SITE_OTHER): Payer: PRIVATE HEALTH INSURANCE | Admitting: Family Medicine

## 2012-06-15 VITALS — BP 140/88 | HR 84 | Resp 16 | Ht 66.75 in | Wt 203.0 lb

## 2012-06-15 DIAGNOSIS — F3289 Other specified depressive episodes: Secondary | ICD-10-CM

## 2012-06-15 DIAGNOSIS — B019 Varicella without complication: Secondary | ICD-10-CM

## 2012-06-15 DIAGNOSIS — F329 Major depressive disorder, single episode, unspecified: Secondary | ICD-10-CM

## 2012-06-15 MED ORDER — HYDROXYZINE HCL 25 MG PO TABS: 25.0000 mg | ORAL_TABLET | Freq: Three times a day (TID) | ORAL | Status: DC | PRN

## 2012-06-15 MED ORDER — ACYCLOVIR 800 MG PO TABS: ORAL_TABLET | ORAL | Status: DC

## 2012-06-15 NOTE — Assessment & Plan Note (Signed)
Acute onset of rash in crops, clinically chicken pox, work excuse and acyclovir

## 2012-06-15 NOTE — Progress Notes (Signed)
  Subjective:    Patient ID: Chelsey Lewis, female    DOB: 11-Nov-1970, 42 y.o.   MRN: [id number]  HPI Pruritic rash in crops on back and abdomen which started yesterday. Clear fluid from lesions, no pus or fever, She has noted ,malaise. No know chicken pox exposure and ha not had this before to her knowledge   Review of Systems See HPI Denies recent fever or chills. Denies sinus pressure, nasal congestion, ear pain or sore throat. Denies chest congestion, productive cough or wheezing. Denies chest pains, palpitations and leg swelling Denies abdominal pain, nausea, vomiting,diarrhea or constipation.   Denies dysuria, frequency, hesitancy or incontinence. Denies joint pain, swelling and limitation in mobility.        Objective:   Physical Exam Patient alert and oriented and in no cardiopulmonary distress.  HEENT: No facial asymmetry, EOMI, no sinus tenderness,  oropharynx pink and moist.  Neck supple no adenopathy.  Chest: Clear to auscultation bilaterally.  CVS: S1, S2 no murmurs, no S3.  ABD: Soft non tender. Bowel sounds normal.  Ext: No edema  MS: Adequate ROM spine, shoulders, hips and knees.  Skin:erythematous based vesicular rash in crops, ost marked on back  Psych: Good eye contact, normal affect. Memory intact not anxious or depressed appearing.  CNS: CN 2-12 intact, power, tone and sensation normal throughout.        Assessment & Plan:

## 2012-06-15 NOTE — Assessment & Plan Note (Signed)
Improving on medication

## 2012-06-15 NOTE — Patient Instructions (Addendum)
F/u as before  Work excuse from today to return on 06/19/2012  Call if the sores get red, hot , painful or leak pus  Do NOT scratch  You are being treated for chicken pox.  Two medications are sent in, ensure you take acyclovir as directed, this will shorten the severity of the illness  OK to use tylenol tablets, one up Chickenpox in Adults Chickenpox is an illness caused by a virus. This virus can spread easily from one person to another. Those with chickenpox almost never get it more than once. While it usually strikes children, adults who have never had the illness or vaccine can get chickenpox. In children, the illness is reasonably mild, although annoying. In adults, it can be very serious. CAUSES  A varicella-zoster virus causes chickenpox. The virus is passed in tiny droplets that the infected person coughs or sneezes into the air. Chickenpox can also be passed when someone comes into contact with the fluid produced by the chickenpox rash. When someone has been exposed to chickenpox, he or she usually comes down with the illness within about 10 to 21 days.  SYMPTOMS  The illness usually starts with:  Body aches and pain.  Headache.  Irritability.  Tiredness.  Fever.  Sore throat. A day or two later, a rash develops. The rash is made up of very itchy blisters. The rash lasts about 5 to 7 days. Each "chicken pock" heals over with a crusty scab. Chickenpox is very serious illness in adults. There is a higher risk of complications, including:  Pneumonia.  Skin infection.  Bone infection (osteomyelitis).  Joint infection (septic arthritis).  Brain infection (encephalitis).  Toxic shock syndrome.  Bleeding problems.  Problems with balance and muscle control (cerebellar ataxia).  Death. Women who get chickenpox during pregnancy have a higher risk of having a baby with birth defects. DIAGNOSIS  A diagnosis is based on the presence of the usual symptoms of achiness  and fever along with the characteristic rash. If there is any question, blood tests can be done to diagnose the infection. TREATMENT  Treatment for chickenpox may include:  Taking the anti-viral drug acyclovir to shorten the length of the illness and to decrease its severity. The drug has to be started within 24 hours of symptoms.  Taking medicine as directed by your caregiver.  Applying calamine lotion to decrease itchiness.  Baking soda or oatmeal baths to soothe itchy skin. When a person who has never had chickenpox has been exposed to the virus (especially a pregnant woman or a patient with HIV or AIDS) they might benefit from a shot of varicella-zoster immune globulin (VZIG). This helps prevent the person from actually coming down with the illness. VZIG must be given within 72 hours of exposure to the virus. HOME CARE INSTRUCTIONS   Only take over-the-counter or prescriptions medicines for pain, discomfort, or fever as directed by your caregiver.  Try taking a lukewarm (not hot) bath every few hours. Adding several tablespoons of baking soda or oatmeal may help make the bath more soothing.  Ice packs or cold washcloths applied to the rash may help improve itching.  Ask your caregiver if you may use an over-the-counter antihistamine (such as diphenhydramine) to decrease itching.  Wash your hands often. This helps lower the risk of a bacterial skin infection, as well as passing the virus to others. If you can, use alcohol-based rubs or wipes. If you cannot get these, use regular soap and water.  If you have  blisters in your mouth, do not eat or drink spicy, salty, or acidic things. Soft, bland, cold foods and beverages will feel best.  Avoid people who have not had chickenpox or women who are pregnant. SEEK IMMEDIATE MEDICAL CARE IF:   You have a hard time breathing.  You have a severe headache.  You have a stiff neck.  You have severe joint pain or stiffness.  You feel  disoriented or confused.  You are having trouble walking or keeping your balance.  You have an oral temperature above 102 F (38.9 C).  The area around one of the chickenpox becomes very red, hot to the touch, painful, or leaks pus. Document Released: 10/09/2007 Document Revised: 03/24/2011 Document Reviewed: 10/09/2007 [facility] Patient Information 2014 [place], [place].  up to 3 times daily for pain or fever

## 2012-07-02 ENCOUNTER — Encounter: Payer: Self-pay | Admitting: Family Medicine

## 2012-07-02 ENCOUNTER — Ambulatory Visit (INDEPENDENT_AMBULATORY_CARE_PROVIDER_SITE_OTHER): Payer: PRIVATE HEALTH INSURANCE | Admitting: Family Medicine

## 2012-07-02 VITALS — BP 130/82 | HR 76 | Resp 18 | Ht 66.75 in | Wt 201.0 lb

## 2012-07-02 DIAGNOSIS — Z139 Encounter for screening, unspecified: Secondary | ICD-10-CM

## 2012-07-02 DIAGNOSIS — L309 Dermatitis, unspecified: Secondary | ICD-10-CM

## 2012-07-02 DIAGNOSIS — F329 Major depressive disorder, single episode, unspecified: Secondary | ICD-10-CM

## 2012-07-02 DIAGNOSIS — B019 Varicella without complication: Secondary | ICD-10-CM

## 2012-07-02 DIAGNOSIS — L259 Unspecified contact dermatitis, unspecified cause: Secondary | ICD-10-CM

## 2012-07-02 MED ORDER — ACYCLOVIR 800 MG PO TABS: ORAL_TABLET | ORAL | Status: AC

## 2012-07-02 NOTE — Progress Notes (Signed)
  Subjective:    Patient ID: Chelsey Lewis, female    DOB: [birth year], 42 y.o.   MRN: [id number]  HPI Pt comes in today with a 1 day h/o red pruritic bumps, re appearing this morning, no fever or chills, occuring on lower abdomen , low back, anterior chest . Was treated as varicella on 6/3 , same rash is appearing. Recent new drug , prozac for depression which was mainly due to stress and has improved She has the added concern that her daughter is pregnanat ,  Hence wary of exposing her to a potentially contagiouis illness.   Review of Systems See HPI Denies recent fever or chills. Denies sinus pressure, nasal congestion, ear pain or sore throat. Denies chest congestion, productive cough or wheezing. Denies chest pains, palpitations and leg swelling Denies abdominal pain, nausea, vomiting,diarrhea or constipation.   Denies dysuria, frequency, hesitancy or incontinence. Denies joint pain, swelling and limitation in mobility. Denies headaches, seizures, numbness, or tingling. Denies depression, anxiety or insomnia. .        Objective:   Physical Exam  Patient alert and oriented and in no cardiopulmonary distress.  HEENT: No facial asymmetry, EOMI, no sinus tenderness,  oropharynx pink and moist.  Neck supple no adenopathy.  Chest: Clear to auscultation bilaterally.  CVS: S1, S2 no murmurs, no S3.  ABD: Soft non tender. Bowel sounds normal.  Ext: No edema  MS: Adequate ROM spine, shoulders, hips and knees.  Skin: Intact, erythematous macular rash in blotches involving areas previously described, no purulent drainage  Psych: Good eye contact, normal affect. Memory intact not anxious or depressed appearing.  CNS: CN 2-12 intact, power, tone and sensation normal throughout.       Assessment & Plan:

## 2012-07-02 NOTE — Assessment & Plan Note (Signed)
Discontinue prozac as may be causing a skin erruption

## 2012-07-02 NOTE — Patient Instructions (Addendum)
F/u in 8 weeks  I do not think that this outbreak represents recurrent chicken pox, and may be a reaction to the medication you recently started fo depression (drug eruption)  Please stop the fluoxetine as we discussed.  You are being referred for dermatology evaluation next week  (none in [place] today). Please call back on Monday for appt info  Since you have a close family member who is pregnant, I will cover you with medication against the chicken pox virus again, until you see the dermatologist, and againv advise that you keep away until the cause of the rash is sorted out   Acyclovir is sent to your pharmacy, please take as directed. Use benadryl for itch  Labs today, HBA1C, Varicella titer, IGG and IGM

## 2012-07-02 NOTE — Assessment & Plan Note (Signed)
recurent pruritic rash in 3 week period, not characteristic in distribution for varicella and was just treated for same, needs derm eval Prozac is new and I am suspecting a drug reaction as a possibility

## 2012-07-12 ENCOUNTER — Telehealth: Payer: Self-pay | Admitting: Family Medicine

## 2012-07-12 NOTE — Telephone Encounter (Signed)
Please advise 

## 2012-07-13 NOTE — Telephone Encounter (Signed)
I am still concerned, espescialy due to potential for spread to succeptible daughter who is pregnant. So yes, espescialy to help determine how long she will need medicationShe was treated appropriately the first time, and rash returned as soon as she finished the medication, not expected, so YES , I still want her to see dermatlogy

## 2012-07-13 NOTE — Telephone Encounter (Signed)
Patient aware and will keep appt

## 2012-08-05 ENCOUNTER — Ambulatory Visit: Payer: PRIVATE HEALTH INSURANCE | Admitting: Family Medicine

## 2012-08-05 ENCOUNTER — Encounter: Payer: Self-pay | Admitting: Family Medicine

## 2012-08-16 ENCOUNTER — Encounter: Payer: Self-pay | Admitting: Family Medicine

## 2012-08-16 ENCOUNTER — Ambulatory Visit (INDEPENDENT_AMBULATORY_CARE_PROVIDER_SITE_OTHER): Payer: PRIVATE HEALTH INSURANCE | Admitting: Family Medicine

## 2012-08-16 VITALS — BP 138/80 | HR 80 | Resp 16 | Ht 66.0 in | Wt 202.4 lb

## 2012-08-16 DIAGNOSIS — F32A Depression, unspecified: Secondary | ICD-10-CM

## 2012-08-16 DIAGNOSIS — G4700 Insomnia, unspecified: Secondary | ICD-10-CM

## 2012-08-16 DIAGNOSIS — R7309 Other abnormal glucose: Secondary | ICD-10-CM

## 2012-08-16 DIAGNOSIS — F329 Major depressive disorder, single episode, unspecified: Secondary | ICD-10-CM

## 2012-08-16 DIAGNOSIS — E669 Obesity, unspecified: Secondary | ICD-10-CM

## 2012-08-16 DIAGNOSIS — R7303 Prediabetes: Secondary | ICD-10-CM

## 2012-08-16 MED ORDER — FLUOXETINE HCL 10 MG PO TABS: 10.0000 mg | ORAL_TABLET | Freq: Every day | ORAL | Status: DC

## 2012-08-16 MED ORDER — TEMAZEPAM 15 MG PO CAPS: 15.0000 mg | ORAL_CAPSULE | Freq: Every evening | ORAL | Status: DC | PRN
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
## 2012-08-16 NOTE — Patient Instructions (Addendum)
F/u mid October, call if you need me before  New for depression and anxiety are fluoxetine , and for sleep is restoril, take medication for sleep half an hour before sleep time Plan to get 7 to 8 hours of sleep every day. Walk for 30 minutes every day when you get in from work please  I am sure you will be better when you get back

## 2012-08-18 ENCOUNTER — Ambulatory Visit (INDEPENDENT_AMBULATORY_CARE_PROVIDER_SITE_OTHER): Payer: PRIVATE HEALTH INSURANCE | Admitting: Psychiatry

## 2012-08-18 DIAGNOSIS — F39 Unspecified mood [affective] disorder: Secondary | ICD-10-CM

## 2012-08-23 ENCOUNTER — Telehealth: Payer: Self-pay | Admitting: Family Medicine

## 2012-08-23 NOTE — Patient Instructions (Signed)
Discussed orally 
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
## 2012-08-24 NOTE — Telephone Encounter (Signed)
Patient is aware 

## 2012-08-24 NOTE — Telephone Encounter (Signed)
noted 

## 2012-08-25 ENCOUNTER — Ambulatory Visit (INDEPENDENT_AMBULATORY_CARE_PROVIDER_SITE_OTHER): Payer: PRIVATE HEALTH INSURANCE | Admitting: Psychiatry

## 2012-08-25 DIAGNOSIS — F411 Generalized anxiety disorder: Secondary | ICD-10-CM

## 2012-08-25 DIAGNOSIS — F39 Unspecified mood [affective] disorder: Secondary | ICD-10-CM

## 2012-08-28 NOTE — Assessment & Plan Note (Signed)
Deteiorated had discontinued prozac, when she initially had a rash unclear etiology at the time Pt verbalized longstanding depression and anxiety primarily due to poor relationships with her mother Agrees with both therapy and medication

## 2012-08-28 NOTE — Assessment & Plan Note (Signed)
Sleep hygiene reviewed. Pt to start medication also

## 2012-08-28 NOTE — Assessment & Plan Note (Signed)
Deteriorated. Patient re-educated about  the importance of commitment to a  minimum of 150 minutes of exercise per week. The importance of healthy food choices with portion control discussed. Encouraged to start a food diary, count calories and to consider  joining a support group. Sample diet sheets offered. Goals set by the patient for the next several months.    

## 2012-08-28 NOTE — Assessment & Plan Note (Signed)
Patient educated about the importance of limiting  Carbohydrate intake , the need to commit to daily physical activity for a minimum of 30 minutes , and to commit weight loss. The fact that changes in all these areas will reduce or eliminate all together the development of diabetes is stressed.   Updated lab at next visit

## 2012-09-01 ENCOUNTER — Ambulatory Visit (HOSPITAL_COMMUNITY): Payer: Self-pay | Admitting: Psychiatry

## 2012-09-01 NOTE — Patient Instructions (Signed)
Discussed orally 
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
## 2012-09-02 ENCOUNTER — Emergency Department (HOSPITAL_COMMUNITY): Payer: PRIVATE HEALTH INSURANCE

## 2012-09-02 ENCOUNTER — Other Ambulatory Visit: Payer: Self-pay

## 2012-09-02 ENCOUNTER — Emergency Department (HOSPITAL_COMMUNITY)
Admission: EM | Admit: 2012-09-02 | Discharge: 2012-09-02 | Disposition: A | Discharge status: Home / Self Care | Payer: PRIVATE HEALTH INSURANCE | Attending: Emergency Medicine | Admitting: Emergency Medicine

## 2012-09-02 ENCOUNTER — Encounter (HOSPITAL_COMMUNITY): Payer: Self-pay | Admitting: Emergency Medicine

## 2012-09-02 DIAGNOSIS — Z8742 Personal history of other diseases of the female genital tract: Secondary | ICD-10-CM | POA: Insufficient documentation

## 2012-09-02 DIAGNOSIS — Z862 Personal history of diseases of the blood and blood-forming organs and certain disorders involving the immune mechanism: Secondary | ICD-10-CM | POA: Insufficient documentation

## 2012-09-02 DIAGNOSIS — Y929 Unspecified place or not applicable: Secondary | ICD-10-CM | POA: Insufficient documentation

## 2012-09-02 DIAGNOSIS — T424X4A Poisoning by benzodiazepines, undetermined, initial encounter: Secondary | ICD-10-CM | POA: Insufficient documentation

## 2012-09-02 DIAGNOSIS — T50901A Poisoning by unspecified drugs, medicaments and biological substances, accidental (unintentional), initial encounter: Secondary | ICD-10-CM

## 2012-09-02 DIAGNOSIS — Z8639 Personal history of other endocrine, nutritional and metabolic disease: Secondary | ICD-10-CM | POA: Insufficient documentation

## 2012-09-02 DIAGNOSIS — F17200 Nicotine dependence, unspecified, uncomplicated: Secondary | ICD-10-CM | POA: Insufficient documentation

## 2012-09-02 DIAGNOSIS — Z79899 Other long term (current) drug therapy: Secondary | ICD-10-CM | POA: Insufficient documentation

## 2012-09-02 DIAGNOSIS — Z8719 Personal history of other diseases of the digestive system: Secondary | ICD-10-CM | POA: Insufficient documentation

## 2012-09-02 DIAGNOSIS — R5381 Other malaise: Secondary | ICD-10-CM | POA: Insufficient documentation

## 2012-09-02 DIAGNOSIS — T424X1A Poisoning by benzodiazepines, accidental (unintentional), initial encounter: Secondary | ICD-10-CM | POA: Insufficient documentation

## 2012-09-02 DIAGNOSIS — E663 Overweight: Secondary | ICD-10-CM | POA: Insufficient documentation

## 2012-09-02 DIAGNOSIS — G43909 Migraine, unspecified, not intractable, without status migrainosus: Secondary | ICD-10-CM | POA: Insufficient documentation

## 2012-09-02 DIAGNOSIS — Y9389 Activity, other specified: Secondary | ICD-10-CM | POA: Insufficient documentation

## 2012-09-02 LAB — COMPREHENSIVE METABOLIC PANEL
AST: 19 U/L (ref 0–37)
Albumin: 3.4 g/dL — ABNORMAL LOW (ref 3.5–5.2)
Alkaline Phosphatase: 50 U/L (ref 39–117)
CO2: 25 mEq/L (ref 19–32)
Chloride: 103 mEq/L (ref 96–112)
GFR calc non Af Amer: 70 mL/min — ABNORMAL LOW (ref >90)
Potassium: 3.6 mEq/L (ref 3.5–5.1)
Total Bilirubin: <0.1 mg/dL — ABNORMAL LOW (ref 0.3–1.2)

## 2012-09-02 LAB — CBC WITH DIFFERENTIAL/PLATELET
Basophils Absolute: 0 10*3/uL (ref 0.0–0.1)
Basophils Relative: 1 % (ref 0–1)
HCT: 35.2 % — ABNORMAL LOW (ref 36.0–46.0)
Hemoglobin: 11.3 g/dL — ABNORMAL LOW (ref 12.0–15.0)
Lymphocytes Relative: 36 % (ref 12–46)
MCHC: 32.1 g/dL (ref 30.0–36.0)
Monocytes Absolute: 0.4 10*3/uL (ref 0.1–1.0)
Monocytes Relative: 8 % (ref 3–12)
Neutro Abs: 2.6 10*3/uL (ref 1.7–7.7)
Neutrophils Relative %: 45 % (ref 43–77)
RDW: 15 % (ref 11.5–15.5)
WBC: 5.6 10*3/uL (ref 4.0–10.5)

## 2012-09-02 LAB — RAPID URINE DRUG SCREEN, HOSP PERFORMED
Amphetamines: NOT DETECTED
Barbiturates: NOT DETECTED
Benzodiazepines: POSITIVE — AB
Tetrahydrocannabinol: NOT DETECTED

## 2012-09-02 NOTE — ED Provider Notes (Signed)
CSN: [id number]     Arrival date & time 09/02/12  0120 History     None    Chief Complaint  Patient presents with  . Drug Overdose   (Consider location/radiation/quality/duration/timing/severity/associated sxs/prior Treatment) HPI Comments: Presents to the ER for possible accidental overdose. Husband reports that the patient started to have a migraine headache today. Patient does have a history of recurrent migraines. Husband thinks that she accidentally took her temazepam instead of Excedrin. He reports that she has become excessively sleepy which alerted him to the possible mistake. He brings her to the ER. On arrival she is very somnolent and does not answer questions appropriately. Level V caveat due to mental status changes.  Patient is a 42 y.o. female presenting with Overdose.  Drug Overdose    Past Medical History  Diagnosis Date  . Vaginal discharge   . Hyperlipidemia   . Overweight(278.02)   . Well adult exam   . Tobacco abuse   . Migraine headache   . Constipation   . Tubal pregnancy   . Anemia   . DUB (dysfunctional uterine bleeding) 05/05/2012  . Peri-menopausal 05/12/2012   Past Surgical History  Procedure Laterality Date  . Oopherectomy and tubal ligation    . Ectopic pregnancy surgery    . 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 Coronary artery disease Father     with CABG   . COPD Mother   . Diabetes Mother   . Hypertension Mother   . Hypertension Sister   . Hypertension Sister   . Dementia Maternal Grandmother   . Cancer Maternal Grandmother     breast and  lung   History  Substance Use Topics  . Smoking status: Current Every Day Smoker  . Smokeless tobacco: Former Neurosurgeon    Quit date: 04/13/2004  . Alcohol Use: No   OB History   Grav Para Term Preterm Abortions TAB SAB Ect Mult Living   2 2        2      Review of Systems  Unable to perform  ROS: Mental status change    Allergies  Review of patient's allergies indicates no known allergies.  Home Medications   Current Outpatient Rx  Name  Route  Sig  Dispense  Refill  . FLUoxetine (PROZAC) 10 MG tablet   Oral   Take 1 tablet (10 mg total) by mouth daily.   30 tablet   3   . naproxen (NAPROSYN) 250 MG tablet   Oral   Take 1 tablet (250 mg total) by mouth 2 (two) times daily with a meal.   14 tablet   0   . temazepam (RESTORIL) 15 MG capsule   Oral   Take 1 capsule (15 mg total) by mouth at bedtime as needed for sleep.   30 capsule   3    There were no vitals taken for this visit. Physical Exam  Constitutional: She appears well-developed and well-nourished. She appears lethargic. No distress.  HENT:  Head: Normocephalic and atraumatic.  Right Ear: Hearing normal.  Left Ear: Hearing normal.  Nose: Nose normal.  Mouth/Throat: Oropharynx is clear and moist and mucous membranes are normal.  Eyes: Conjunctivae and EOM are normal. Pupils are equal, round, and reactive to light.  Neck: Normal range of motion. Neck supple.  Cardiovascular: Regular rhythm, S1 normal and S2 normal.  Exam reveals no gallop and no friction rub.   No murmur heard. Pulmonary/Chest: Effort normal and breath sounds normal. No respiratory distress. She exhibits no tenderness.  Abdominal: Soft. Normal appearance and bowel sounds are normal. There is no hepatosplenomegaly. There is no tenderness. There is no rebound, no guarding, no tenderness at McBurney's point and negative Murphy's sign. No hernia.  Musculoskeletal: Normal range of motion.  Neurological: She has normal strength. She appears lethargic. No cranial nerve deficit or sensory deficit. Coordination normal. GCS eye subscore is 3. GCS verbal subscore is 4. GCS motor subscore is 6.  Skin: Skin is warm, dry and intact. No rash noted. No cyanosis.  Psychiatric: She has a normal mood and affect. Her speech is normal and behavior is  normal. Thought content normal.    ED Course   Procedures (including critical care time)  EKG:  Date: 09/02/2012  Rate: 70  Rhythm: normal sinus rhythm  QRS Axis: normal  Intervals: normal  ST/T Wave abnormalities: normal  Conduction Disutrbances: none  Narrative Interpretation: unremarkable      Labs Reviewed  CBC WITH DIFFERENTIAL - Abnormal; Notable for the following:    Hemoglobin 11.3 (*)    HCT 35.2 (*)    Eosinophils Relative 10 (*)    All other components within normal limits  COMPREHENSIVE METABOLIC PANEL - Abnormal; Notable for the following:    Albumin 3.4 (*)    Total Bilirubin <0.1 (*)    GFR calc non Af Amer 70 (*)    GFR calc Af Amer 81 (*)    All other components within normal limits  SALICYLATE LEVEL - Abnormal; Notable for the following:    Salicylate Lvl <2.0 (*)    All other components within normal limits  URINE RAPID DRUG SCREEN (HOSP PERFORMED) - Abnormal; Notable for the following:    Benzodiazepines POSITIVE (*)    All other components within normal limits  ACETAMINOPHEN LEVEL   Ct Head Wo Contrast  09/02/2012   *RADIOLOGY REPORT*  Clinical Data: Altered mental status, headache  CT HEAD WITHOUT CONTRAST  Technique:  Contiguous axial images were obtained from the base of the skull through the vertex without contrast.  Comparison: Prior CT from 04/14/2007  Findings: There is no acute intracranial hemorrhage or infarct.  No midline shift or mass lesion.  CSF containing spaces are normal. Gray-white matter differentiation is preserved.  No extra-axial fluid collection.  Calvarium is intact.  Orbital soft tissues are normal.  Paranasal sinuses and mastoid air cells are clear.  IMPRESSION: Normal head CT with no acute intracranial process.   Original Report Authenticated By: Rise Mu, M.D.   Diagnosis: Accidental overdose  MDM  Comes to the ER for evaluation after accidental overdose on Restoril. Patient mistakenly took Restoril instead of  the Excedrin she meant to take for a headache. Husband reports that she became very somnolent after that. She is extremely sleepy, but arouses and follows commands. She is protecting her airway. Workup was negative including head CT. This was observed for a period of time and continues to do well. She'll be discharged to the care of her husband.  Gilda Crease, MD 09/02/12 [phone]

## 2012-09-02 NOTE — ED Notes (Signed)
Pt's husband states pt had headache and meant to take excedrin but took unknown amount of temazepam. Pt is very sleepy at this time.

## 2012-09-02 NOTE — ED Notes (Signed)
Sleeping peacefully, maintaining O2 sats well on r/a

## 2012-09-02 NOTE — ED Notes (Signed)
Per pt's husband, "she'll take pills 4 or 5 at time" states there are loose pills all over her bedside table all the time. "She never keeps the lids on any of them" (no small children or grandchildren in home) husband denies alcohol or substance abuse.

## 2012-09-02 NOTE — ED Notes (Signed)
Awake, wants to know if she can go home now. ERMD in to re-evaluate. Pt denies suicidal ideation. Maintains that she thought the temazepam was excedrin. Talked with the pt and reiterated to the family that a better safer way needs to be found for her medications, esp. With a new grandchild on the way. Pt agrees.

## 2012-09-08 ENCOUNTER — Ambulatory Visit (HOSPITAL_COMMUNITY): Payer: PRIVATE HEALTH INSURANCE | Admitting: Psychiatry

## 2012-09-08 ENCOUNTER — Ambulatory Visit (INDEPENDENT_AMBULATORY_CARE_PROVIDER_SITE_OTHER): Payer: PRIVATE HEALTH INSURANCE | Admitting: Psychiatry

## 2012-09-08 DIAGNOSIS — F39 Unspecified mood [affective] disorder: Secondary | ICD-10-CM

## 2012-09-08 DIAGNOSIS — F411 Generalized anxiety disorder: Secondary | ICD-10-CM

## 2012-09-09 NOTE — Patient Instructions (Signed)
Discussed orally 
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
## 2012-09-22 ENCOUNTER — Ambulatory Visit (INDEPENDENT_AMBULATORY_CARE_PROVIDER_SITE_OTHER): Payer: PRIVATE HEALTH INSURANCE | Admitting: Psychiatry

## 2012-09-22 DIAGNOSIS — F39 Unspecified mood [affective] disorder: Secondary | ICD-10-CM

## 2012-09-22 DIAGNOSIS — F411 Generalized anxiety disorder: Secondary | ICD-10-CM

## 2012-09-22 NOTE — Patient Instructions (Signed)
Discussed orally 
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
## 2012-10-06 ENCOUNTER — Ambulatory Visit (HOSPITAL_COMMUNITY): Payer: Self-pay | Admitting: Psychiatry

## 2012-10-15 ENCOUNTER — Ambulatory Visit (HOSPITAL_COMMUNITY): Payer: Self-pay | Admitting: Psychiatry

## 2012-11-03 ENCOUNTER — Encounter: Payer: Self-pay | Admitting: Family Medicine

## 2012-11-03 ENCOUNTER — Encounter (INDEPENDENT_AMBULATORY_CARE_PROVIDER_SITE_OTHER): Payer: Self-pay

## 2012-11-03 ENCOUNTER — Ambulatory Visit (INDEPENDENT_AMBULATORY_CARE_PROVIDER_SITE_OTHER): Payer: PRIVATE HEALTH INSURANCE | Admitting: Family Medicine

## 2012-11-03 VITALS — BP 138/84 | HR 84 | Resp 16 | Ht 66.0 in | Wt 197.1 lb

## 2012-11-03 DIAGNOSIS — R7303 Prediabetes: Secondary | ICD-10-CM

## 2012-11-03 DIAGNOSIS — G4700 Insomnia, unspecified: Secondary | ICD-10-CM

## 2012-11-03 DIAGNOSIS — F17200 Nicotine dependence, unspecified, uncomplicated: Secondary | ICD-10-CM

## 2012-11-03 DIAGNOSIS — F3289 Other specified depressive episodes: Secondary | ICD-10-CM

## 2012-11-03 DIAGNOSIS — E669 Obesity, unspecified: Secondary | ICD-10-CM

## 2012-11-03 DIAGNOSIS — F329 Major depressive disorder, single episode, unspecified: Secondary | ICD-10-CM

## 2012-11-03 DIAGNOSIS — E559 Vitamin D deficiency, unspecified: Secondary | ICD-10-CM

## 2012-11-03 DIAGNOSIS — R7309 Other abnormal glucose: Secondary | ICD-10-CM

## 2012-11-03 MED ORDER — BUPROPION HCL ER (SR) 150 MG PO TB12: 150.0000 mg | ORAL_TABLET | Freq: Two times a day (BID) | ORAL | Status: DC

## 2012-11-03 MED ORDER — ERGOCALCIFEROL 1.25 MG (50000 UT) PO CAPS: 50000.0000 [IU] | ORAL_CAPSULE | ORAL | Status: DC

## 2012-11-03 NOTE — Progress Notes (Signed)
  Subjective:    Patient ID: Chelsey Lewis, female    DOB: 1970/07/08, 42 y.o.   MRN: [id number]  HPI The PT is here for follow up and re-evaluation of chronic medical conditions, medication management and review of any available recent lab and radiology data.  Preventive health is updated, specifically  Cancer screening and Immunization.   Questions or concerns regarding consultations or procedures which the PT has had in the interim are  Addressed.Reginal Lutes has started therapy for depression and this has been extremely beneficial, also happy with birth of her first grandchild The PT denies any adverse reactions to current medications since the last visit.  There are no new concerns.  There are no specific complaints       Review of Systems See HPI Denies recent fever or chills. Denies sinus pressure, nasal congestion, ear pain or sore throat. Denies chest congestion, productive cough or wheezing. Denies chest pains, palpitations and leg swelling Denies abdominal pain, nausea, vomiting,diarrhea or constipation.   Denies dysuria, frequency, hesitancy or incontinence. Denies joint pain, swelling and limitation in mobility. Denies headaches, seizures, numbness, or tingling. Denies uncontrolled  depression, anxiety or insomnia. Denies skin break down or rash.        Objective:   Physical Exam  Patient alert and oriented and in no cardiopulmonary distress.  HEENT: No facial asymmetry, EOMI, no sinus tenderness,  oropharynx pink and moist.  Neck supple no adenopathy.  Chest: Clear to auscultation bilaterally.  CVS: S1, S2 no murmurs, no S3.  ABD: Soft non tender. Bowel sounds normal.  Ext: No edema  MS: Adequate ROM spine, shoulders, hips and knees.  Skin: Intact, no ulcerations or rash noted.  Psych: Good eye contact, normal affect. Memory intact not anxious or depressed appearing.  CNS: CN 2-12 intact, power, tone and sensation normal throughout.       Assessment &  Plan:

## 2012-11-03 NOTE — Patient Instructions (Signed)
F/u in 4 month with rectal, call if you need me before  New to help with smoking cessation is welbutrin one twice daily, quit date is October 31  Please continue to walk regularly and to see the therapist, this is helping you a lot  New for vit D deficiency is once weekly vit D for 6 months total  You will get both prescriptions Aim for weight loss is 1.5 to 2 pounds per month

## 2012-11-09 ENCOUNTER — Ambulatory Visit (INDEPENDENT_AMBULATORY_CARE_PROVIDER_SITE_OTHER): Payer: PRIVATE HEALTH INSURANCE | Admitting: Psychiatry

## 2012-11-09 DIAGNOSIS — F411 Generalized anxiety disorder: Secondary | ICD-10-CM

## 2012-11-09 DIAGNOSIS — F39 Unspecified mood [affective] disorder: Secondary | ICD-10-CM

## 2012-11-09 NOTE — Patient Instructions (Signed)
Discussed orally 
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
## 2012-11-23 ENCOUNTER — Ambulatory Visit (HOSPITAL_COMMUNITY): Payer: Self-pay | Admitting: Psychiatry

## 2012-11-23 ENCOUNTER — Other Ambulatory Visit: Payer: Self-pay | Admitting: Family Medicine

## 2012-11-23 DIAGNOSIS — Z139 Encounter for screening, unspecified: Secondary | ICD-10-CM

## 2012-12-02 ENCOUNTER — Ambulatory Visit (HOSPITAL_COMMUNITY)
Admission: RE | Admit: 2012-12-02 | Discharge: 2012-12-02 | Disposition: A | Discharge status: Home / Self Care | Payer: PRIVATE HEALTH INSURANCE | Source: Ambulatory Visit | Attending: Family Medicine | Admitting: Family Medicine

## 2012-12-02 DIAGNOSIS — Z139 Encounter for screening, unspecified: Secondary | ICD-10-CM

## 2012-12-02 DIAGNOSIS — Z1231 Encounter for screening mammogram for malignant neoplasm of breast: Secondary | ICD-10-CM | POA: Insufficient documentation

## 2012-12-13 ENCOUNTER — Ambulatory Visit (HOSPITAL_COMMUNITY): Payer: Self-pay | Admitting: Psychiatry

## 2013-01-09 ENCOUNTER — Encounter: Payer: Self-pay | Admitting: Family Medicine

## 2013-01-09 DIAGNOSIS — F17200 Nicotine dependence, unspecified, uncomplicated: Secondary | ICD-10-CM | POA: Insufficient documentation

## 2013-01-09 NOTE — Assessment & Plan Note (Signed)
Reports that she is ready to quit  Patient counseled for approximately 5 minutes regarding the health risks of ongoing nicotine use, specifically all types of cancer, heart disease, stroke and respiratory failure. The options available for help with cessation ,the behavioral changes to assist the process, and the option to either gradully reduce usage  Or abruptly stop.is also discussed. Pt is also encouraged to set specific goals in number of cigarettes used daily, as well as to set a quit date. Inc dose of wellbutrin

## 2013-01-09 NOTE — Assessment & Plan Note (Signed)
Improved with improvement in depression. Sleep hygiene reviewed

## 2013-01-09 NOTE — Assessment & Plan Note (Signed)
Patient educated about the importance of limiting  Carbohydrate intake , the need to commit to daily physical activity for a minimum of 30 minutes , and to commit weight loss. The fact that changes in all these areas will reduce or eliminate all together the development of diabetes is stressed.    

## 2013-01-09 NOTE — Assessment & Plan Note (Signed)
Increase wellbutrin dose, despite the fact that she is doing better. This primarily due to ongoing smoking and her decision to quit. Therapy continues to be very beneficial per her report

## 2013-01-09 NOTE — Assessment & Plan Note (Signed)
Weekly supplement for 6 month, minimum

## 2013-01-09 NOTE — Assessment & Plan Note (Signed)
Improved. Pt applauded on succesful weight loss through lifestyle change, and encouraged to continue same. Weight loss goal set for the next several months.  

## 2013-03-09 ENCOUNTER — Ambulatory Visit: Payer: PRIVATE HEALTH INSURANCE | Admitting: Family Medicine

## 2013-03-28 ENCOUNTER — Encounter: Payer: Self-pay | Admitting: Family Medicine

## 2013-03-28 ENCOUNTER — Ambulatory Visit (INDEPENDENT_AMBULATORY_CARE_PROVIDER_SITE_OTHER): Payer: PRIVATE HEALTH INSURANCE | Admitting: Family Medicine

## 2013-03-28 ENCOUNTER — Encounter (INDEPENDENT_AMBULATORY_CARE_PROVIDER_SITE_OTHER): Payer: Self-pay

## 2013-03-28 VITALS — BP 122/78 | HR 94 | Resp 16 | Ht 65.5 in | Wt 196.4 lb

## 2013-03-28 DIAGNOSIS — E785 Hyperlipidemia, unspecified: Secondary | ICD-10-CM

## 2013-03-28 DIAGNOSIS — J309 Allergic rhinitis, unspecified: Secondary | ICD-10-CM

## 2013-03-28 DIAGNOSIS — Z1211 Encounter for screening for malignant neoplasm of colon: Secondary | ICD-10-CM

## 2013-03-28 DIAGNOSIS — E669 Obesity, unspecified: Secondary | ICD-10-CM

## 2013-03-28 DIAGNOSIS — E049 Nontoxic goiter, unspecified: Secondary | ICD-10-CM

## 2013-03-28 DIAGNOSIS — E8881 Metabolic syndrome: Secondary | ICD-10-CM | POA: Insufficient documentation

## 2013-03-28 DIAGNOSIS — F17200 Nicotine dependence, unspecified, uncomplicated: Secondary | ICD-10-CM

## 2013-03-28 DIAGNOSIS — R7303 Prediabetes: Secondary | ICD-10-CM

## 2013-03-28 DIAGNOSIS — R7309 Other abnormal glucose: Secondary | ICD-10-CM

## 2013-03-28 DIAGNOSIS — E559 Vitamin D deficiency, unspecified: Secondary | ICD-10-CM

## 2013-03-28 DIAGNOSIS — D649 Anemia, unspecified: Secondary | ICD-10-CM

## 2013-03-28 DIAGNOSIS — Z1212 Encounter for screening for malignant neoplasm of rectum: Secondary | ICD-10-CM

## 2013-03-28 LAB — POC HEMOCCULT BLD/STL (OFFICE/1-CARD/DIAGNOSTIC): FECAL OCCULT BLD: NEGATIVE

## 2013-03-28 MED ORDER — PHENTERMINE HCL 37.5 MG PO TABS: 37.5000 mg | ORAL_TABLET | Freq: Every day | ORAL | Status: DC

## 2013-03-28 MED ORDER — VARENICLINE TARTRATE 0.5 MG PO TABS: 0.5000 mg | ORAL_TABLET | Freq: Two times a day (BID) | ORAL | Status: DC

## 2013-03-28 NOTE — Progress Notes (Signed)
Subjective:    Patient ID: SELITA STAIGER, female    DOB: 1970-11-24, 43 y.o.   MRN: [id number]  HPI  Wants  chantix to stop smoking she resumed 3 months ago after 8 years due to stress states wellbutrin of no benefit but with the chantix plans a quit date in 2weeks [place] to resume phentermine for help with weight loss.Goal is 2.5 to 3 pounds per month C/o increased and uncontrolled allergy symptoms in past 2 to 3 weeks, no fever, chills or purulent sputum .   Review of Systems See HPI Denies recent fever or chills.  Denies chest congestion, productive cough or wheezing. Denies chest pains, palpitations and leg swelling Denies abdominal pain, nausea, vomiting,diarrhea or constipation.   Denies dysuria, frequency, hesitancy or incontinence. Denies joint pain, swelling and limitation in mobility. Denies headaches, seizures, numbness, or tingling. Denies depression, anxiety or insomnia. Denies skin break down or rash.        Objective:   Physical Exam  BP 122/78  Pulse 94  Resp 16  Ht 5' 5.5" (1.664 m)  Wt 196 lb 6.4 oz (89.086 kg)  BMI 32.17 kg/m2  SpO2 98% Patient alert and oriented and in no cardiopulmonary distress.  HEENT: No facial asymmetry, EOMI,   oropharynx pink and moist.  Neck supple no JVD, no mass. Nasal mucosa erythematous and edematous , bilateral conjunctival injection Chest: Clear to auscultation bilaterally.decreased though adequate air entry  CVS: S1, S2 no murmurs, no S3.  ABD: Soft non tender. No organomegaly or mass,normal BS Rectal: normal sphincter tone , heme negative stool , no mass or hemmrhoid palpated  Ext: No edema  MS: Adequate ROM spine, shoulders, hips and knees.  Skin: Intact, no ulcerations or rash noted.  Psych: Good eye contact, normal affect. Memory intact not anxious or depressed appearing.  CNS: CN 2-12 intact, power,  normal throughout.no focal deficits noted.       Assessment & Plan:  Prediabetes Patient  educated about the importance of limiting  Carbohydrate intake , the need to commit to daily physical activity for a minimum of 30 minutes , and to commit weight loss. The fact that changes in all these areas will reduce or eliminate all together the development of diabetes is stressed.   Updated lab needed at/ before next visit.   Metabolic syndrome X The increased risk of cardiovascular disease associated with this diagnosis, and the need to consistently work on lifestyle to change this is discussed. Following  a  heart healthy diet ,commitment to 30 minutes of exercise at least 5 days per week, as well as control of blood sugar and cholesterol , and achieving a healthy weight are all the areas to be addressed .   OBESITY Deteriorated. Patient re-educated about  the importance of commitment to a  minimum of 150 minutes of exercise per week. The importance of healthy food choices with portion control discussed. Encouraged to start a food diary, count calories and to consider  joining a support group. Sample diet sheets offered. Goals set by the patient for the next several months.   Pt to start phentermine also  Nicotine dependence Patient counseled for approximately 5 minutes regarding the health risks of ongoing nicotine use, specifically all types of cancer, heart disease, stroke and respiratory failure. The options available for help with cessation ,the behavioral changes to assist the process, and the option to either gradully reduce usage  Or abruptly stop.is also discussed. Pt is also encouraged to set  specific goals in number of cigarettes used daily, as well as to set a quit date. Script written for chantix  HYPERLIPIDEMIA Hyperlipidemia:Low fat diet discussed and encouraged.  Updated lab needed at/ before next visit.   ANEMIA-NOS Re evaluation needed , will order appropriate lab  Allergic rhinitis Increased symptoms with season change, currently uncontrolled Start  topical and oral med

## 2013-03-28 NOTE — Patient Instructions (Signed)
F/u in 4 month, call if you need me before  Plan to quit smoking in 2 weeks, medcation has been prescribed for 30 days (chantix )  It is important that you exercise regularly at least 30 minutes 5 times a week. If you develop chest pain, have severe difficulty breathing, or feel very tired, stop exercising immediately and seek medical attention   A healthy diet is rich in fruit, vegetables and whole grains. Poultry fish, nuts and beans are a healthy choice for protein rather then red meat. A low sodium diet and drinking 64 ounces of water daily is generally recommended. Oils and sweet should be limited. Carbohydrates especially for those who are diabetic or overweight, should be limited to 60-45 gram per meal. It is important to eat on a regular schedule, at least 3 times daily. Snacks should be primarily fruits, vegetables or nuts.  New is phentermine one daily, weight loss goal of 2.5 to 3 pounds per month  CBC, fasting lipid, chem 7 , hBA1C, tSH abd vit D this week please  Weight loss, regular exercise , smoking cessation, and increasing vegetable and fruit in your diet will all work together to reduce your risk of heart disease, so STICK WITH THE  PLAN  Rectal exam today is normal  Metabolic Syndrome, Adult Metabolic syndrome descibes a group of risk factors for heart disease and diabetes. This syndrome has other names including Insulin Resistance Syndrome. The more risk factors you have, the higher your risk of having a heart attack, stroke, or developing diabetes. These risk factors include:  High blood sugar.  High blood triglyceride (a fat found in the blood) level.  High blood pressure.  Abdominal obesity (your extra weight is around your waist instead of your hips).  Low levels of high-density lipoprotein, HDL (good blood cholesterol). If you have any three of these risk factors, you have metabolic syndrome. If you have even one of these factors, you should make lifestyle  changes to improve your health in order to prevent serious health diseases.  In people with metabolic syndrome, the cells do not respond properly to insulin. This can lead to high levels of glucose in the blood, which can interfere with normal body processes. Eventually, this can cause high blood pressure and higher fat levels in the blood, and inflammation of your blood vessels. The result can be heart disease and stroke.  CAUSES   Eating a diet rich in calories and saturated fat.  Too little physical activity.  Being overweight. Other underlying causes are:  Family history (genetics).  Ethnicity (South Asians are at a higher risk).  Older age (your chances of developing metabolic syndrome are higher as you grow older).  Insulin resistance. SYMPTOMS  By itself, metabolic syndrome has no symptoms. However, you might have symptoms of diabetes (high blood sugar) or high blood pressure, such as:  Increased thirst, urination, and tiredness.  Dizzy spells.  Dull headaches that are unusual for you.  Blurred vision.  Nosebleeds. DIAGNOSIS  Your caregiver may make a diagnosis of metabolic syndrome if you have at least three of these factors:  If you are overweight mostly around the waist. This means a waistline greater than 40" in men and more than 35" in women. The waistline limits are 31 to 35 inches for women and 37 to 39 inches for men. In those who have certain genetic risk factors, such as having a family history of diabetes or being of Asian descent.  If you have a  blood pressure of 130/85 mm Hg or more, or if you are being treated for high blood pressure.  If your blood triglyceride level is 150 mg/dL or more, or you are being treated for high levels of triglyceride.  If the level of HDL in your blood is below 40 mg/dL in men, less than 50 mg/dL in women, or you are receiving treatment for low levels of HDL.  If the level of sugar in your blood is high with fasting blood  sugar level of 110 mg/dL or more, or you are under treatment for diabetes. TREATMENT  Your caregiver may have you make lifestyle changes, which may include:  Exercise.  Losing weight.  Maintaining a healthy diet.  Quitting smoking. The lifestyle changes listed above are key in reducing your risk for heart disease and stroke. Medicines may also be prescribed to help your body respond to insulin better and to reduce your blood pressure and blood fat levels. Aspirin may be recommended to reduce risks of heart disease or stroke.  HOME CARE INSTRUCTIONS   Exercise.  Measure your waist at regular intervals just above the hipbones after you have breathed out.  Maintain a healthy diet.  Eat fruits, such as apples, oranges, and pears.  Eat vegetables.  Eat legumes, such as kidney beans, peas, and lentils.  Eat food rich in soluble fiber, such as whole grain cereal, oatmeal, and oat bran.  Use olive or safflower oils and avoid saturated fats.  Eat nuts.  Limit the amount of salt you eat or add to food.  Limit the amount of alcohol you drink.  Include fish in your diet, if possible.  Stop smoking if you are a smoker.  Maintain regular follow-up appointments.  Follow your caregiver's advice. SEEK MEDICAL CARE IF:   You feel very tired or fatigued.  You develop excessive thirst.  You pass large quantities of urine.  You are putting on weight around your waist rather than losing weight.  You develop headaches over and over again.  You have off-and-on dizzy spells. SEEK IMMEDIATE MEDICAL CARE IF:   You develop nosebleeds.  You develop sudden blurred vision.  You develop sudden dizzy spells.  You develop chest pains, trouble breathing, or feel an abnormal or irregular heart beat.  You have a fainting episode.  You develop any sudden trouble speaking and/or swallowing.  You develop sudden weakness in one arm and/or one leg. MAKE SURE YOU:   Understand these  instructions.  Will watch your condition.  Will get help right away if you are not doing well or get worse. Document Released: 04/08/2007 Document Revised: 03/24/2011 Document Reviewed: 04/08/2007 [facility] Patient Information 2014 [place], [place].

## 2013-03-31 ENCOUNTER — Other Ambulatory Visit: Payer: Self-pay

## 2013-03-31 DIAGNOSIS — E559 Vitamin D deficiency, unspecified: Secondary | ICD-10-CM

## 2013-04-18 LAB — BASIC METABOLIC PANEL
BUN: 10 mg/dL (ref 6–23)
CALCIUM: 9.7 mg/dL (ref 8.4–10.5)
CO2: 26 mEq/L (ref 19–32)
CREATININE: 1.12 mg/dL — AB (ref 0.50–1.10)
Chloride: 106 mEq/L (ref 96–112)
Glucose, Bld: 80 mg/dL (ref 70–99)
Potassium: 4.2 mEq/L (ref 3.5–5.3)
Sodium: 138 mEq/L (ref 135–145)

## 2013-04-18 LAB — CBC
HCT: 35.7 % — ABNORMAL LOW (ref 36.0–46.0)
Hemoglobin: 12 g/dL (ref 12.0–15.0)
MCH: 27.6 pg (ref 26.0–34.0)
MCHC: 33.6 g/dL (ref 30.0–36.0)
MCV: 82.3 fL (ref 78.0–100.0)
PLATELETS: 376 10*3/uL (ref 150–400)
RBC: 4.34 MIL/uL (ref 3.87–5.11)
RDW: 14.9 % (ref 11.5–15.5)
WBC: 6.2 10*3/uL (ref 4.0–10.5)

## 2013-04-18 LAB — LIPID PANEL
CHOL/HDL RATIO: 4.4 ratio
Cholesterol: 170 mg/dL (ref 0–200)
HDL: 39 mg/dL — AB (ref >39)
LDL CALC: 115 mg/dL — AB (ref 0–99)
Triglycerides: 82 mg/dL (ref <150)
VLDL: 16 mg/dL (ref 0–40)

## 2013-04-18 LAB — HEMOGLOBIN A1C
Hgb A1c MFr Bld: 5.6 % (ref <5.7)
MEAN PLASMA GLUCOSE: 114 mg/dL (ref <117)

## 2013-04-18 LAB — TSH: TSH: 1.957 u[IU]/mL (ref 0.350–4.500)

## 2013-04-19 LAB — VITAMIN D 25 HYDROXY (VIT D DEFICIENCY, FRACTURES): VIT D 25 HYDROXY: 29 ng/mL — AB (ref 30–89)

## 2013-05-12 ENCOUNTER — Telehealth: Payer: Self-pay | Admitting: Family Medicine

## 2013-05-12 MED ORDER — FLUTICASONE PROPIONATE 50 MCG/ACT NA SUSP: 2.0000 | Freq: Every day | NASAL | Status: DC

## 2013-05-12 MED ORDER — LORATADINE 10 MG PO TABS: 10.0000 mg | ORAL_TABLET | Freq: Every day | ORAL | Status: DC

## 2013-05-12 NOTE — Telephone Encounter (Signed)
loratidine and flonase are sent in please let her know

## 2013-05-12 NOTE — Telephone Encounter (Signed)
Patient aware.

## 2013-06-30 DIAGNOSIS — J309 Allergic rhinitis, unspecified: Secondary | ICD-10-CM | POA: Insufficient documentation

## 2013-06-30 NOTE — Assessment & Plan Note (Signed)
Hyperlipidemia:Low fat diet discussed and encouraged.  Updated lab needed at/ before next visit.  

## 2013-06-30 NOTE — Assessment & Plan Note (Signed)
Patient counseled for approximately 5 minutes regarding the health risks of ongoing nicotine use, specifically all types of cancer, heart disease, stroke and respiratory failure. The options available for help with cessation ,the behavioral changes to assist the process, and the option to either gradully reduce usage  Or abruptly stop.is also discussed. Pt is also encouraged to set specific goals in number of cigarettes used daily, as well as to set a quit date. Script written for chantix

## 2013-06-30 NOTE — Assessment & Plan Note (Signed)
Deteriorated. Patient re-educated about  the importance of commitment to a  minimum of 150 minutes of exercise per week. The importance of healthy food choices with portion control discussed. Encouraged to start a food diary, count calories and to consider  joining a support group. Sample diet sheets offered. Goals set by the patient for the next several months.   Pt to start phentermine also

## 2013-06-30 NOTE — Assessment & Plan Note (Signed)
Re evaluation needed , will order appropriate lab

## 2013-06-30 NOTE — Assessment & Plan Note (Signed)
The increased risk of cardiovascular disease associated with this diagnosis, and the need to consistently work on lifestyle to change this is discussed. Following  a  heart healthy diet ,commitment to 30 minutes of exercise at least 5 days per week, as well as control of blood sugar and cholesterol , and achieving a healthy weight are all the areas to be addressed .  

## 2013-06-30 NOTE — Assessment & Plan Note (Signed)
Increased symptoms with season change, currently uncontrolled Start topical and oral med

## 2013-06-30 NOTE — Assessment & Plan Note (Signed)
Patient educated about the importance of limiting  Carbohydrate intake , the need to commit to daily physical activity for a minimum of 30 minutes , and to commit weight loss. The fact that changes in all these areas will reduce or eliminate all together the development of diabetes is stressed.   Updated lab needed at/ before next visit.  

## 2013-07-28 ENCOUNTER — Ambulatory Visit (INDEPENDENT_AMBULATORY_CARE_PROVIDER_SITE_OTHER): Payer: 59 | Admitting: Family Medicine

## 2013-07-28 ENCOUNTER — Encounter: Payer: Self-pay | Admitting: Family Medicine

## 2013-07-28 ENCOUNTER — Encounter (INDEPENDENT_AMBULATORY_CARE_PROVIDER_SITE_OTHER): Payer: Self-pay

## 2013-07-28 VITALS — BP 126/74 | HR 86 | Resp 18 | Ht 65.5 in | Wt 178.0 lb

## 2013-07-28 DIAGNOSIS — F329 Major depressive disorder, single episode, unspecified: Secondary | ICD-10-CM

## 2013-07-28 DIAGNOSIS — F32A Depression, unspecified: Secondary | ICD-10-CM

## 2013-07-28 DIAGNOSIS — F17210 Nicotine dependence, cigarettes, uncomplicated: Secondary | ICD-10-CM

## 2013-07-28 DIAGNOSIS — F17200 Nicotine dependence, unspecified, uncomplicated: Secondary | ICD-10-CM

## 2013-07-28 DIAGNOSIS — E8881 Metabolic syndrome: Secondary | ICD-10-CM

## 2013-07-28 DIAGNOSIS — R7303 Prediabetes: Secondary | ICD-10-CM

## 2013-07-28 DIAGNOSIS — F3289 Other specified depressive episodes: Secondary | ICD-10-CM

## 2013-07-28 DIAGNOSIS — R7309 Other abnormal glucose: Secondary | ICD-10-CM

## 2013-07-28 DIAGNOSIS — E559 Vitamin D deficiency, unspecified: Secondary | ICD-10-CM

## 2013-07-28 DIAGNOSIS — E785 Hyperlipidemia, unspecified: Secondary | ICD-10-CM

## 2013-07-28 DIAGNOSIS — E669 Obesity, unspecified: Secondary | ICD-10-CM

## 2013-07-28 MED ORDER — PHENTERMINE HCL 37.5 MG PO TABS: ORAL_TABLET | ORAL | Status: DC

## 2013-07-28 MED ORDER — PHENTERMINE HCL 37.5 MG PO TABS: 37.5000 mg | ORAL_TABLET | Freq: Every day | ORAL | Status: DC

## 2013-07-28 NOTE — Progress Notes (Signed)
Subjective:    Patient ID: Chelsey Lewis, female    DOB: [birth year], 43 y.o.   MRN: [id number]  HPI The PT is here for follow up and re-evaluation of chronic medical conditions, medication management and review of any available recent lab and radiology data.  Preventive health is updated, specifically  Cancer screening and Immunization.   Denies adverse s/e with phentermine and has been diligent with lifestyle change with excellent weight loss Still smoking same number of ciggs since last visit, but states she does want to quit, just finds it a major challenge. Improved depression with therapy, no medication taken      Review of Systems See HPI Denies recent fever or chills. Denies sinus pressure, nasal congestion, ear pain or sore throat. Denies chest congestion, productive cough or wheezing. Denies chest pains, palpitations and leg swelling Denies abdominal pain, nausea, vomiting,diarrhea or constipation.   Denies dysuria, frequency, hesitancy or incontinence. Denies joint pain, swelling and limitation in mobility. Denies headaches, seizures, numbness, or tingling. Denies depression, anxiety or insomnia. Denies skin break down or rash.        Objective:   Physical Exam  BP 126/74  Pulse 86  Resp 18  Ht 5' 5.5" (1.664 m)  Wt 178 lb (80.74 kg)  BMI 29.16 kg/m2  SpO2 98% Patient alert and oriented and in no cardiopulmonary distress.  HEENT: No facial asymmetry, EOMI,   oropharynx pink and moist.  Neck supple no JVD, no mass.  Chest: Clear to auscultation bilaterally.  CVS: S1, S2 no murmurs, no S3.Regular rate.  ABD: Soft non tender.   Ext: No edema  MS: Adequate ROM spine, shoulders, hips and knees.  Skin: Intact, no ulcerations or rash noted.  Psych: Good eye contact, normal affect. Memory intact not anxious or depressed appearing.  CNS: CN 2-12 intact, power,  normal throughout.no focal deficits noted.       Assessment & Plan:   Prediabetes Improved wit weight loss, pt congratulated on this  Patient educated about the importance of limiting  Carbohydrate intake , the need to commit to daily physical activity for a minimum of 30 minutes , and to commit weight loss. The fact that changes in all these areas will reduce or eliminate all together the development of diabetes is stressed.     Nicotine dependence unchanged Patient counseled for approximately 5 minutes regarding the health risks of ongoing nicotine use, specifically all types of cancer, heart disease, stroke and respiratory failure. The options available for help with cessation ,the behavioral changes to assist the process, and the option to either gradully reduce usage  Or abruptly stop.is also discussed. Pt is also encouraged to set specific goals in number of cigarettes used daily, as well as to set a quit date.   OBESITY Improved. Pt applauded on succesful weight loss through lifestyle change, and encouraged to continue same. Weight loss goal set for the next several months. Reduce phentermine dose to half daily  Metabolic syndrome X The increased risk of cardiovascular disease associated with this diagnosis, and the need to consistently work on lifestyle to change this is discussed. Following  a  heart healthy diet ,commitment to 30 minutes of exercise at least 5 days per week, as well as control of blood sugar and cholesterol , and achieving a healthy weight are all the areas to be addressed .   HYPERLIPIDEMIA Hyperlipidemia:Low fat diet discussed and encouraged.    Unspecified vitamin D deficiency Continue weekly vit D Updated lab  needed at/ before next visit.   Depression Improved and has benefited significantly from therapy , on no antidepressant

## 2013-07-28 NOTE — Patient Instructions (Signed)
F/u in 4 month, call if you ned me before  Reduce to HALF phentermine daily  CONGRATS on weight loss, make change in food choice as we discussed  Weight loss  Goal of 2.5 to 3 pounds per month  Pls work on smoking cessation, YOU NEED TO QUIT  START AT 6 CIGGS PER DAY, THEN 5 PER DAY AFTER 2 WEEKS

## 2013-07-30 NOTE — Assessment & Plan Note (Signed)
Improved. Pt applauded on succesful weight loss through lifestyle change, and encouraged to continue same. Weight loss goal set for the next several months. Reduce phentermine dose to half daily

## 2013-07-30 NOTE — Assessment & Plan Note (Signed)
Improved and has benefited significantly from therapy , on no antidepressant

## 2013-07-30 NOTE — Assessment & Plan Note (Signed)
Hyperlipidemia:Low fat diet discussed and encouraged.   

## 2013-07-30 NOTE — Assessment & Plan Note (Signed)
Improved wit weight loss, pt congratulated on this  Patient educated about the importance of limiting  Carbohydrate intake , the need to commit to daily physical activity for a minimum of 30 minutes , and to commit weight loss. The fact that changes in all these areas will reduce or eliminate all together the development of diabetes is stressed.

## 2013-07-30 NOTE — Assessment & Plan Note (Signed)
The increased risk of cardiovascular disease associated with this diagnosis, and the need to consistently work on lifestyle to change this is discussed. Following  a  heart healthy diet ,commitment to 30 minutes of exercise at least 5 days per week, as well as control of blood sugar and cholesterol , and achieving a healthy weight are all the areas to be addressed .  

## 2013-07-30 NOTE — Assessment & Plan Note (Signed)
unchanged Patient counseled for approximately 5 minutes regarding the health risks of ongoing nicotine use, specifically all types of cancer, heart disease, stroke and respiratory failure. The options available for help with cessation ,the behavioral changes to assist the process, and the option to either gradully reduce usage  Or abruptly stop.is also discussed. Pt is also encouraged to set specific goals in number of cigarettes used daily, as well as to set a quit date.  

## 2013-07-30 NOTE — Assessment & Plan Note (Signed)
Continue weekly vit D Updated lab needed at/ before next visit.  

## 2013-10-31 ENCOUNTER — Encounter: Payer: Self-pay | Admitting: Family Medicine

## 2013-10-31 ENCOUNTER — Ambulatory Visit (INDEPENDENT_AMBULATORY_CARE_PROVIDER_SITE_OTHER): Payer: 59 | Admitting: Family Medicine

## 2013-10-31 ENCOUNTER — Encounter (INDEPENDENT_AMBULATORY_CARE_PROVIDER_SITE_OTHER): Payer: Self-pay

## 2013-10-31 ENCOUNTER — Other Ambulatory Visit: Payer: Self-pay

## 2013-10-31 VITALS — BP 126/86 | HR 78 | Resp 16 | Ht 65.0 in | Wt 173.4 lb

## 2013-10-31 DIAGNOSIS — G43119 Migraine with aura, intractable, without status migrainosus: Secondary | ICD-10-CM

## 2013-10-31 DIAGNOSIS — F17210 Nicotine dependence, cigarettes, uncomplicated: Secondary | ICD-10-CM

## 2013-10-31 DIAGNOSIS — J3089 Other allergic rhinitis: Secondary | ICD-10-CM

## 2013-10-31 DIAGNOSIS — E669 Obesity, unspecified: Secondary | ICD-10-CM

## 2013-10-31 DIAGNOSIS — R7309 Other abnormal glucose: Secondary | ICD-10-CM

## 2013-10-31 DIAGNOSIS — G43109 Migraine with aura, not intractable, without status migrainosus: Secondary | ICD-10-CM

## 2013-10-31 DIAGNOSIS — E785 Hyperlipidemia, unspecified: Secondary | ICD-10-CM

## 2013-10-31 DIAGNOSIS — R7303 Prediabetes: Secondary | ICD-10-CM

## 2013-10-31 DIAGNOSIS — G43111 Migraine with aura, intractable, with status migrainosus: Secondary | ICD-10-CM

## 2013-10-31 MED ORDER — BUTALBITAL-APAP-CAFFEINE 50-325-40 MG PO TABS: ORAL_TABLET | ORAL | Status: DC

## 2013-10-31 MED ORDER — PHENTERMINE HCL 37.5 MG PO TABS: 37.5000 mg | ORAL_TABLET | Freq: Every day | ORAL | Status: DC

## 2013-10-31 MED ORDER — KETOROLAC TROMETHAMINE 60 MG/2ML IM SOLN
60.0000 mg | Freq: Once | INTRAMUSCULAR | Status: AC
  Administered 2013-10-31: 60 mg via INTRAMUSCULAR

## 2013-10-31 MED ORDER — METHYLPREDNISOLONE ACETATE 80 MG/ML IJ SUSP
80.0000 mg | Freq: Once | INTRAMUSCULAR | Status: AC
  Administered 2013-10-31: 80 mg via INTRAMUSCULAR

## 2013-10-31 MED ORDER — PREDNISONE 5 MG PO TABS: 5.0000 mg | ORAL_TABLET | Freq: Two times a day (BID) | ORAL | Status: AC

## 2013-10-31 MED ORDER — IBUPROFEN 800 MG PO TABS: ORAL_TABLET | ORAL | Status: DC

## 2013-10-31 NOTE — Assessment & Plan Note (Signed)
Improving , continue half phentermine daily and regular exercise and low carb diet Improved. Pt applauded on succesful weight loss through lifestyle change, and encouraged to continue same. Weight loss goal set for the next several months.

## 2013-10-31 NOTE — Patient Instructions (Addendum)
F/u in 4.5 month, call if you need me before  Toradol and depo medrol in office for headache, prednisone , fioricet and ibuprofen in limited supply also prescribed.  Work excuse for 10/17, return with no restrictions on 11/03/2013  Fasting lipid, chem 7 , cBC and vit D in 4.5 month, beforre visit  Congrats on weight loss, keep it up  Please work on smoking cessation, need to quit to lower heart disease and astroke risk , also risk of all types of cancer  Call [facility]

## 2013-10-31 NOTE — Assessment & Plan Note (Signed)
Hyperlipidemia:Low fat diet discussed and encouraged.  Updated lab needed at/ before next visit.  

## 2013-10-31 NOTE — Assessment & Plan Note (Signed)
Four day h/o disabling headache, out of work on 10/17. Anti inflammatories in office and prescribed, also fioricet. Return to work as scheduled , currently on vacation until 11/03/2013

## 2013-10-31 NOTE — Progress Notes (Signed)
   Subjective:    Patient ID: Chelsey Lewis, female    DOB: 1970-08-18, 43 y.o.   MRN: [id number]  HPI Migraine headache started 3 days ago while at work was a 10 plus, no known trigger, she worked through the headache on day of onset, but did not go to work the following day. Has used ibuprofen and tylenol , now an 8, experiencing bitemporal soreness Headache is like her "typical migraine" accompanied by the aura of excessive noise in both ears Smokes on avg 10 per day, trying to quit but unwilling to set a date at this time working with appetite suppressant as far as food choice and exercise are concerned with no adverse s/e, take half phentermine daily   Review of Systems See HPI Denies recent fever or chills. Denies sinus pressure, nasal congestion, ear pain or sore throat. Denies chest congestion, productive cough or wheezing. Denies chest pains, palpitations and leg swelling Denies abdominal pain, nausea, vomiting,diarrhea or constipation.   Denies dysuria, frequency, hesitancy or incontinence. Denies joint pain, swelling and limitation in mobility.  Denies depression, anxiety or insomnia. Denies skin break down or rash.        Objective:   Physical Exam  BP 126/86  Pulse 78  Resp 16  Ht 5\' 5"  (1.651 m)  Wt 173 lb 6.4 oz (78.654 kg)  BMI 28.86 kg/m2  SpO2 98% Patient alert and oriented and in no cardiopulmonary distress.Pt in pain  HEENT: No facial asymmetry, EOMI,   oropharynx pink and moist.  Neck supple no JVD, no mass. PERTL. Bitemporal  tenderness Chest: Clear to auscultation bilaterally.  CVS: S1, S2 no murmurs, no S3.Regular rate.  ABD: Soft non tender.   Ext: No edema  MS: Adequate ROM spine, shoulders, hips and knees.  Skin: Intact, no ulcerations or rash noted.  Psych: Good eye contact, normal affect. Memory intact not anxious or depressed appearing.  CNS: CN 2-12 intact, power,  normal throughout.no focal deficits noted.       Assessment &  Plan:  Migraine Four day h/o disabling headache, out of work on 10/17. Anti inflammatories in office and prescribed, also fioricet. Return to work as scheduled , currently on vacation until 11/03/2013  Nicotine dependence Deteriorated, up to 10 per day, unwilling to set quit date at thsi time, though wants to quit Patient counseled for approximately 5 minutes regarding the health risks of ongoing nicotine use, specifically all types of cancer, heart disease, stroke and respiratory failure. The options available for help with cessation ,the behavioral changes to assist the process, and the option to either gradully reduce usage  Or abruptly stop.is also discussed. Pt is also encouraged to set specific goals in number of cigarettes used daily, as well as to set a quit date.   Overweight (BMI 25.0-29.9) Improving , continue half phentermine daily and regular exercise and low carb diet Improved. Pt applauded on succesful weight loss through lifestyle change, and encouraged to continue same. Weight loss goal set for the next several months.   Allergic rhinitis No current flare, uses meds as needed only  Hyperlipemia Hyperlipidemia:Low fat diet discussed and encouraged.  Updated lab needed at/ before next visit.

## 2013-10-31 NOTE — Assessment & Plan Note (Signed)
Deteriorated, up to 10 per day, unwilling to set quit date at thsi time, though wants to quit Patient counseled for approximately 5 minutes regarding the health risks of ongoing nicotine use, specifically all types of cancer, heart disease, stroke and respiratory failure. The options available for help with cessation ,the behavioral changes to assist the process, and the option to either gradully reduce usage  Or abruptly stop.is also discussed. Pt is also encouraged to set specific goals in number of cigarettes used daily, as well as to set a quit date.

## 2013-10-31 NOTE — Assessment & Plan Note (Signed)
No current flare, uses meds as needed only

## 2013-11-14 ENCOUNTER — Encounter: Payer: Self-pay | Admitting: Family Medicine

## 2013-11-15 ENCOUNTER — Encounter: Payer: Self-pay | Admitting: Family Medicine

## 2013-11-21 ENCOUNTER — Ambulatory Visit: Payer: 59 | Admitting: Family Medicine

## 2013-12-26 ENCOUNTER — Other Ambulatory Visit: Payer: Self-pay | Admitting: Family Medicine

## 2013-12-26 DIAGNOSIS — Z1231 Encounter for screening mammogram for malignant neoplasm of breast: Secondary | ICD-10-CM

## 2013-12-30 ENCOUNTER — Other Ambulatory Visit: Payer: Self-pay | Admitting: Family Medicine

## 2013-12-30 ENCOUNTER — Ambulatory Visit (HOSPITAL_COMMUNITY)
Admission: RE | Admit: 2013-12-30 | Discharge: 2013-12-30 | Disposition: A | Discharge status: Home / Self Care | Payer: Self-pay | Source: Ambulatory Visit | Attending: Family Medicine | Admitting: Family Medicine

## 2013-12-30 DIAGNOSIS — N6320 Unspecified lump in the left breast, unspecified quadrant: Secondary | ICD-10-CM

## 2013-12-30 DIAGNOSIS — N644 Mastodynia: Secondary | ICD-10-CM

## 2013-12-30 DIAGNOSIS — Z1231 Encounter for screening mammogram for malignant neoplasm of breast: Secondary | ICD-10-CM

## 2014-01-03 ENCOUNTER — Other Ambulatory Visit: Payer: Self-pay | Admitting: Family Medicine

## 2014-01-03 ENCOUNTER — Ambulatory Visit (HOSPITAL_COMMUNITY)
Admission: RE | Admit: 2014-01-03 | Discharge: 2014-01-03 | Disposition: A | Discharge status: Home / Self Care | Payer: 59 | Source: Ambulatory Visit | Attending: Family Medicine | Admitting: Family Medicine

## 2014-01-03 DIAGNOSIS — N6320 Unspecified lump in the left breast, unspecified quadrant: Secondary | ICD-10-CM

## 2014-01-03 DIAGNOSIS — N644 Mastodynia: Secondary | ICD-10-CM

## 2014-01-03 DIAGNOSIS — I808 Phlebitis and thrombophlebitis of other sites: Secondary | ICD-10-CM | POA: Diagnosis not present

## 2014-01-03 DIAGNOSIS — N61 Inflammatory disorders of breast: Secondary | ICD-10-CM | POA: Diagnosis present

## 2014-04-05 ENCOUNTER — Ambulatory Visit (INDEPENDENT_AMBULATORY_CARE_PROVIDER_SITE_OTHER): Payer: 59 | Admitting: Family Medicine

## 2014-04-05 ENCOUNTER — Encounter: Payer: Self-pay | Admitting: Family Medicine

## 2014-04-05 VITALS — BP 120/82 | HR 82 | Resp 16 | Ht 65.0 in | Wt 170.0 lb

## 2014-04-05 DIAGNOSIS — E785 Hyperlipidemia, unspecified: Secondary | ICD-10-CM | POA: Diagnosis not present

## 2014-04-05 DIAGNOSIS — J3089 Other allergic rhinitis: Secondary | ICD-10-CM

## 2014-04-05 DIAGNOSIS — R7303 Prediabetes: Secondary | ICD-10-CM

## 2014-04-05 DIAGNOSIS — E559 Vitamin D deficiency, unspecified: Secondary | ICD-10-CM

## 2014-04-05 DIAGNOSIS — R7309 Other abnormal glucose: Secondary | ICD-10-CM | POA: Diagnosis not present

## 2014-04-05 DIAGNOSIS — G4700 Insomnia, unspecified: Secondary | ICD-10-CM | POA: Diagnosis not present

## 2014-04-05 DIAGNOSIS — E8881 Metabolic syndrome: Secondary | ICD-10-CM

## 2014-04-05 DIAGNOSIS — Z1159 Encounter for screening for other viral diseases: Secondary | ICD-10-CM

## 2014-04-05 DIAGNOSIS — G43009 Migraine without aura, not intractable, without status migrainosus: Secondary | ICD-10-CM

## 2014-04-05 DIAGNOSIS — F17219 Nicotine dependence, cigarettes, with unspecified nicotine-induced disorders: Secondary | ICD-10-CM

## 2014-04-05 LAB — CBC WITH DIFFERENTIAL/PLATELET
BASOS ABS: 0.1 10*3/uL (ref 0.0–0.1)
Basophils Relative: 1 % (ref 0–1)
EOS ABS: 0.5 10*3/uL (ref 0.0–0.7)
Eosinophils Relative: 6 % — ABNORMAL HIGH (ref 0–5)
HCT: 36.3 % (ref 36.0–46.0)
HEMOGLOBIN: 11.7 g/dL — AB (ref 12.0–15.0)
LYMPHS ABS: 3.3 10*3/uL (ref 0.7–4.0)
Lymphocytes Relative: 44 % (ref 12–46)
MCH: 25.7 pg — ABNORMAL LOW (ref 26.0–34.0)
MCHC: 32.2 g/dL (ref 30.0–36.0)
MCV: 79.6 fL (ref 78.0–100.0)
MPV: 9.2 fL (ref 8.6–12.4)
Monocytes Absolute: 0.5 10*3/uL (ref 0.1–1.0)
Monocytes Relative: 6 % (ref 3–12)
Neutro Abs: 3.2 10*3/uL (ref 1.7–7.7)
Neutrophils Relative %: 43 % (ref 43–77)
Platelets: 428 10*3/uL — ABNORMAL HIGH (ref 150–400)
RBC: 4.56 MIL/uL (ref 3.87–5.11)
RDW: 17.1 % — ABNORMAL HIGH (ref 11.5–15.5)
WBC: 7.5 10*3/uL (ref 4.0–10.5)

## 2014-04-05 LAB — BASIC METABOLIC PANEL
BUN: 14 mg/dL (ref 6–23)
CO2: 24 meq/L (ref 19–32)
Calcium: 10.4 mg/dL (ref 8.4–10.5)
Chloride: 105 mEq/L (ref 96–112)
Creat: 0.9 mg/dL (ref 0.50–1.10)
GLUCOSE: 71 mg/dL (ref 70–99)
Potassium: 4.2 mEq/L (ref 3.5–5.3)
SODIUM: 137 meq/L (ref 135–145)

## 2014-04-05 LAB — LIPID PANEL
CHOL/HDL RATIO: 3.5 ratio
CHOLESTEROL: 197 mg/dL (ref 0–200)
HDL: 56 mg/dL (ref >=46)
LDL CALC: 130 mg/dL — AB (ref 0–99)
Triglycerides: 55 mg/dL (ref <150)
VLDL: 11 mg/dL (ref 0–40)

## 2014-04-05 MED ORDER — TEMAZEPAM 15 MG PO CAPS: 15.0000 mg | ORAL_CAPSULE | Freq: Every evening | ORAL | Status: DC | PRN

## 2014-04-05 NOTE — Patient Instructions (Signed)
F/u in 12 weeks with rectal  Quit date for smoking is 10 weeks  From today  We will give you info to help with this  Stop the phentermine  It is important that you exercise regularly at least 30 to 60 minutes 5 times a week. If you develop chest pain, have severe difficulty breathing, or feel very tired, stop exercising immediately and seek medical attention   New for help with sleep is restoril , practice good sleep hygiene  Fasting lipid, chem7, HIV, Vit D, cBC today

## 2014-04-06 LAB — HIV ANTIBODY (ROUTINE TESTING W REFLEX): HIV 1&2 Ab, 4th Generation: NONREACTIVE

## 2014-04-06 LAB — VITAMIN D 25 HYDROXY (VIT D DEFICIENCY, FRACTURES): VIT D 25 HYDROXY: 17 ng/mL — AB (ref 30–100)

## 2014-04-12 ENCOUNTER — Other Ambulatory Visit: Payer: Self-pay

## 2014-04-12 DIAGNOSIS — E559 Vitamin D deficiency, unspecified: Secondary | ICD-10-CM

## 2014-04-12 MED ORDER — ERGOCALCIFEROL 1.25 MG (50000 UT) PO CAPS: 50000.0000 [IU] | ORAL_CAPSULE | ORAL | Status: DC

## 2014-06-22 NOTE — Assessment & Plan Note (Signed)
Deteriorated, inc in LDL Dietary change  discussed and encouraged

## 2014-06-22 NOTE — Progress Notes (Signed)
Chelsey Lewis     MRN: [id number]      DOB: [birth year]   HPI Chelsey Lewis is here for follow up and re-evaluation of chronic medical conditions, medication management and review of any available recent lab and radiology data.  Preventive health is updated, specifically  Cancer screening and Immunization.   Questions or concerns regarding consultations or procedures which the PT has had in the interim are  addressed. The PT denies any adverse reactions to current medications since the last visit.  Wants to d/c phentermine for weight loss, and wants to stop smoking. Needs help with sleep ROS Denies recent fever or chills. Denies excessive  sinus pressure, nasal congestion, ear pain or sore throat. Denies chest congestion, productive cough or wheezing. Denies chest pains, palpitations and leg swelling Denies abdominal pain, nausea, vomiting,diarrhea or constipation.   Denies dysuria, frequency, hesitancy or incontinence. Denies joint pain, swelling and limitation in mobility. Denies uncontrolled headaches, seizures, numbness, or tingling. Denies depression or  anxiety states that therapy helped a lot, c/o worsening  insomnia. Denies skin break down or rash.   PE  BP 120/82 mmHg  Pulse 82  Resp 16  Ht 5\' 5"  (1.651 m)  Wt 170 lb (77.111 kg)  BMI 28.29 kg/m2  SpO2 99%  Patient alert and oriented and in no cardiopulmonary distress.  HEENT: No facial asymmetry, EOMI,   oropharynx pink and moist.  Neck supple no JVD, no mass.  Chest: Clear to auscultation bilaterally.  CVS: S1, S2 no murmurs, no S3.Regular rate.  ABD: Soft non tender.   Ext: No edema  MS: Adequate ROM spine, shoulders, hips and knees.  Skin: Intact, no ulcerations or rash noted.  Psych: Good eye contact, normal affect. Memory intact not anxious or depressed appearing.  CNS: CN 2-12 intact, power,  normal throughout.no focal deficits noted.   Assessment & Plan   Nicotine dependence Deteriorated and wants  to quit Patient counseled for approximately 5 minutes regarding the health risks of ongoing nicotine use, specifically all types of cancer, heart disease, stroke and respiratory failure. The options available for help with cessation ,the behavioral changes to assist the process, and the option to either gradully reduce usage  Or abruptly stop.is also discussed. Pt is also encouraged to set specific goals in number of cigarettes used daily, as well as to set a quit date.  Number of cigarettes/cigars currently smoking daily: 10   Insomnia DETERIORATED Sleep hygiene reviewed and written information offered also. Prescription sent for  medication needed.   Prediabetes Improved to corrected value with weight loss, pt applauded on this Patient educated about the importance of limiting  Carbohydrate intake , the need to commit to daily physical activity for a minimum of 30 minutes , and to commit weight loss. The fact that changes in all these areas will reduce or eliminate all together the development of diabetes is stressed.   Diabetic Labs Latest Ref Rng 04/05/2014 04/18/2013 09/02/2012 05/05/2012 12/23/2011  HbA1c <5.7 % - 5.6 - - 5.7(H)  Chol 0 - 200 mg/dL 197 170 - 107 177  HDL >=46 mg/dL 56 39(L) - 33(L) 43  Calc LDL 0 - 99 mg/dL 130(H) 115(H) - 60 123(H)  Triglycerides <150 mg/dL 55 82 - 72 53  Creatinine 0.50 - 1.10 mg/dL 0.90 1.12(H) 0.97 1.04 1.06   BP/Weight 04/05/2014 10/31/2013 07/28/2013 03/28/2013 11/03/2012 7/82/9562 01/16/863  Systolic BP 784 696 295 284 132 440 102  Diastolic BP 82 86 74 78 84  81 80  Wt. (Lbs) 170 173.4 178 196.4 197.12 200 202.4  BMI 28.29 28.86 29.16 32.17 31.83 32.3 32.68   No flowsheet data found.     Allergic rhinitis Symptoms are mild and intermittent , claritin as needed only  Migraine Less frequent and severe, fioricet is effective, and use is limited to  Headache not responding to OTC pain med  Dyslipidemia, goal LDL below 100 Deteriorated,  inc in LDL Dietary change  discussed and encouraged  Vitamin D deficiency Improved, daily OTC vit D supplement appropriate  Metabolic syndrome X The increased risk of cardiovascular disease associated with this diagnosis, and the need to consistently work on lifestyle to change this is discussed. Following  a  heart healthy diet ,commitment to 30 minutes of exercise at least 5 days per week, as well as control of blood sugar and cholesterol , and achieving a healthy weight are all the areas to be addressed .

## 2014-06-22 NOTE — Assessment & Plan Note (Signed)
Symptoms are mild and intermittent , claritin as needed only

## 2014-06-22 NOTE — Assessment & Plan Note (Signed)
Improved, daily OTC vit D supplement appropriate

## 2014-06-22 NOTE — Assessment & Plan Note (Signed)
DETERIORATED Sleep hygiene reviewed and written information offered also. Prescription sent for  medication needed.

## 2014-06-22 NOTE — Assessment & Plan Note (Signed)
Less frequent and severe, fioricet is effective, and use is limited to  Headache not responding to OTC pain med

## 2014-06-22 NOTE — Progress Notes (Signed)
   Subjective:    Patient ID: Chelsey Lewis, female    DOB: 01-Oct-1970, 44 y.o.   MRN: [id number]  HPI    Review of Systems     Objective:   Physical Exam        Assessment & Plan:

## 2014-06-22 NOTE — Assessment & Plan Note (Signed)
The increased risk of cardiovascular disease associated with this diagnosis, and the need to consistently work on lifestyle to change this is discussed. Following  a  heart healthy diet ,commitment to 30 minutes of exercise at least 5 days per week, as well as control of blood sugar and cholesterol , and achieving a healthy weight are all the areas to be addressed .  

## 2014-06-22 NOTE — Assessment & Plan Note (Signed)
Improved to corrected value with weight loss, pt applauded on this Patient educated about the importance of limiting  Carbohydrate intake , the need to commit to daily physical activity for a minimum of 30 minutes , and to commit weight loss. The fact that changes in all these areas will reduce or eliminate all together the development of diabetes is stressed.   Diabetic Labs Latest Ref Rng 04/05/2014 04/18/2013 09/02/2012 05/05/2012 12/23/2011  HbA1c <5.7 % - 5.6 - - 5.7(H)  Chol 0 - 200 mg/dL 197 170 - 107 177  HDL >=46 mg/dL 56 39(L) - 33(L) 43  Calc LDL 0 - 99 mg/dL 130(H) 115(H) - 60 123(H)  Triglycerides <150 mg/dL 55 82 - 72 53  Creatinine 0.50 - 1.10 mg/dL 0.90 1.12(H) 0.97 1.04 1.06   BP/Weight 04/05/2014 10/31/2013 07/28/2013 03/28/2013 11/03/2012 0/35/4656 08/13/2749  Systolic BP 700 174 944 967 591 638 466  Diastolic BP 82 86 74 78 84 81 80  Wt. (Lbs) 170 173.4 178 196.4 197.12 200 202.4  BMI 28.29 28.86 29.16 32.17 31.83 32.3 32.68   No flowsheet data found.

## 2014-06-22 NOTE — Assessment & Plan Note (Signed)
Deteriorated and wants to quit Patient counseled for approximately 5 minutes regarding the health risks of ongoing nicotine use, specifically all types of cancer, heart disease, stroke and respiratory failure. The options available for help with cessation ,the behavioral changes to assist the process, and the option to either gradully reduce usage  Or abruptly stop.is also discussed. Pt is also encouraged to set specific goals in number of cigarettes used daily, as well as to set a quit date.  Number of cigarettes/cigars currently smoking daily: 10

## 2014-08-14 ENCOUNTER — Encounter: Payer: Self-pay | Admitting: *Deleted

## 2014-08-14 ENCOUNTER — Ambulatory Visit: Payer: 59 | Admitting: Family Medicine

## 2014-09-12 ENCOUNTER — Ambulatory Visit: Payer: 59 | Admitting: Family Medicine

## 2014-10-12 ENCOUNTER — Ambulatory Visit (INDEPENDENT_AMBULATORY_CARE_PROVIDER_SITE_OTHER): Payer: 59 | Admitting: Family Medicine

## 2014-10-12 ENCOUNTER — Encounter: Payer: Self-pay | Admitting: Family Medicine

## 2014-10-12 VITALS — BP 130/82 | HR 74 | Resp 16 | Ht 65.0 in | Wt 173.0 lb

## 2014-10-12 DIAGNOSIS — E785 Hyperlipidemia, unspecified: Secondary | ICD-10-CM | POA: Diagnosis not present

## 2014-10-12 DIAGNOSIS — Z23 Encounter for immunization: Secondary | ICD-10-CM | POA: Diagnosis not present

## 2014-10-12 DIAGNOSIS — Z1211 Encounter for screening for malignant neoplasm of colon: Secondary | ICD-10-CM

## 2014-10-12 DIAGNOSIS — R7309 Other abnormal glucose: Secondary | ICD-10-CM

## 2014-10-12 DIAGNOSIS — G43109 Migraine with aura, not intractable, without status migrainosus: Secondary | ICD-10-CM

## 2014-10-12 DIAGNOSIS — D649 Anemia, unspecified: Secondary | ICD-10-CM

## 2014-10-12 DIAGNOSIS — E559 Vitamin D deficiency, unspecified: Secondary | ICD-10-CM

## 2014-10-12 DIAGNOSIS — F17208 Nicotine dependence, unspecified, with other nicotine-induced disorders: Secondary | ICD-10-CM

## 2014-10-12 DIAGNOSIS — R7303 Prediabetes: Secondary | ICD-10-CM

## 2014-10-12 DIAGNOSIS — E8881 Metabolic syndrome: Secondary | ICD-10-CM

## 2014-10-12 DIAGNOSIS — G4700 Insomnia, unspecified: Secondary | ICD-10-CM

## 2014-10-12 DIAGNOSIS — E663 Overweight: Secondary | ICD-10-CM

## 2014-10-12 MED ORDER — BUTALBITAL-APAP-CAFFEINE 50-325-40 MG PO TABS: ORAL_TABLET | ORAL | Status: DC

## 2014-10-12 MED ORDER — IBUPROFEN 800 MG PO TABS: ORAL_TABLET | ORAL | Status: DC

## 2014-10-12 NOTE — Patient Instructions (Addendum)
CPE in 5 month, call if you need me before  Flu vaccine today  Fioricet and ibuprofen for migraine, call if not working for daily meds. Start a headache diary  Please work on good  health habits so that your health will improve. 1. Commitment to daily physical activity for 30 to 60  minutes, if you are able to do this.  2. Commitment to wise food choices. Aim for half of your  food intake to be vegetable and fruit, one quarter starchy foods, and one quarter protein. Try to eat on a regular schedule  3 meals per day, snacking between meals should be limited to vegetables or fruits or small portions of nuts. 64 ounces of water per day is generally recommended, unless you have specific health conditions, like heart failure or kidney failure where you will need to limit fluid intake.  3. Commitment to sufficient and a  good quality of physical and mental rest daily, generally between 6 to 8 hours per day.  WITH PERSISTANCE AND PERSEVERANCE, THE IMPOSSIBLE , BECOMES THE NORM!  Thanks for choosing [facility], we consider it a privelige to serve you.  Fasting CBC, lipid, chem 7, Vit D, CBc, iron and ferritin in 5 month  Weight goal of 165 lb  Need o work consistently on stopping smoking    Migraine Headache A migraine headache is very bad, throbbing pain on one or both sides of your head. Talk to your doctor about what things may bring on (trigger) your migraine headaches. HOME CARE  Only take medicines as told by your doctor.  Lie down in a dark, quiet room when you have a migraine.  Keep a journal to find out if certain things bring on migraine headaches. For example, write down:  What you eat and drink.  How much sleep you get.  Any change to your diet or medicines.  Lessen how much alcohol you drink.  Quit smoking if you smoke.  Get enough sleep.  Lessen any stress in your life.  Keep lights dim if bright lights bother you or make your migraines  worse. GET HELP RIGHT AWAY IF:   Your migraine becomes really bad.  You have a fever.  You have a stiff neck.  You have trouble seeing.  Your muscles are weak, or you lose muscle control.  You lose your balance or have trouble walking.  You feel like you will pass out (faint), or you pass out.  You have really bad symptoms that are different than your first symptoms. MAKE SURE YOU:   Understand these instructions.  Will watch your condition.  Will get help right away if you are not doing well or get worse. Document Released: 10/09/2007 Document Revised: 03/24/2011 Document Reviewed: 09/06/2012 [facility] Patient Information 2015 [place], [place]. This information is not intended to replace advice given to you by your health care Zyonna Vardaman. Make sure you discuss any questions you have with your health care Elizardo Chilson.

## 2014-10-13 DIAGNOSIS — Z1211 Encounter for screening for malignant neoplasm of colon: Secondary | ICD-10-CM | POA: Insufficient documentation

## 2014-10-13 LAB — POC HEMOCCULT BLD/STL (OFFICE/1-CARD/DIAGNOSTIC): Fecal Occult Blood, POC: NEGATIVE

## 2014-10-13 NOTE — Progress Notes (Signed)
Subjective:    Patient ID: Chelsey Lewis, female    DOB: 1970-10-26, 44 y.o.   MRN: [id number]  HPI   Chelsey Lewis     MRN: [id number]      DOB: 10-Sep-1970   HPI [name] is here for follow up and re-evaluation of chronic medical conditions, medication management and review of any available recent lab and radiology data.  Preventive health is updated, specifically  Cancer screening and Immunization.   Questions or concerns regarding consultations or procedures which the PT has had in the interim are  addressed. The PT denies any adverse reactions to current medications since the last visit.  There are no new concerns.  There are no specific complaints   ROS Denies recent fever or chills. Denies sinus pressure, nasal congestion, ear pain or sore throat. Denies chest congestion, productive cough or wheezing. Denies chest pains, palpitations and leg swelling Denies abdominal pain, nausea, vomiting,diarrhea or constipation.   Denies dysuria, frequency, hesitancy or incontinence. Denies joint pain, swelling and limitation in mobility. Denies headaches, seizures, numbness, or tingling. Denies depression, anxiety or insomnia. Denies skin break down or rash.   PE  BP 130/82 mmHg  Pulse 74  Resp 16  Ht 5\' 5"  (1.651 m)  Wt 173 lb (78.472 kg)  BMI 28.79 kg/m2  SpO2 100%  Patient alert and oriented and in no cardiopulmonary distress.  HEENT: No facial asymmetry, EOMI,   oropharynx pink and moist.  Neck supple no JVD, no mass.  Chest: Clear to auscultation bilaterally.  CVS: S1, S2 no murmurs, no S3.Regular rate.  ABD: Soft non tender. No organomegaly or mass, normal BS Rectal: no mass, heme negative stool  Ext: No edema  MS: Adequate ROM spine, shoulders, hips and knees.  Skin: Intact, no ulcerations or rash noted.  Psych: Good eye contact, normal affect. Memory intact not anxious or depressed appearing.  CNS: CN 2-12 intact, power,  normal throughout.no focal deficits  noted.   Assessment & Plan   Migraine Increased headache frequency and severity in past 2 weeks, reports increased work pressure in terms of hours with less sleep. Pt to work on lifestyle changes to improve both, start headache diary, fioricet for use in PM when migraine is severe, may need to add daily prophylactic medication, she will call back if no improvement in frequency in next 4 to 6 weeks   Prediabetes Patient educated about the importance of limiting  Carbohydrate intake , the need to commit to daily physical activity for a minimum of 30 minutes , and to commit weight loss. The fact that changes in all these areas will reduce or eliminate all together the development of diabetes is stressed.   Diabetic Labs Latest Ref Rng 04/05/2014 04/18/2013 09/02/2012 05/05/2012 12/23/2011  HbA1c <5.7 % - 5.6 - - 5.7(H)  Chol 0 - 200 mg/dL 197 170 - 107 177  HDL >=46 mg/dL 56 39(L) - 33(L) 43  Calc LDL 0 - 99 mg/dL 130(H) 115(H) - 60 123(H)  Triglycerides <150 mg/dL 55 82 - 72 53  Creatinine 0.50 - 1.10 mg/dL 0.90 1.12(H) 0.97 1.04 1.06   BP/Weight 10/12/2014 04/05/2014 10/31/2013 07/28/2013 03/28/2013 11/03/2012 2/44/0102  Systolic BP 725 366 440 347 425 956 387  Diastolic BP 82 82 86 74 78 84 81  Wt. (Lbs) 173 170 173.4 178 196.4 197.12 200  BMI 28.79 28.29 28.86 29.16 32.17 31.83 32.3   No flowsheet data found. Updated lab needed at/ before next visit.  Dyslipidemia, goal LDL below 100 Hyperlipidemia:Low fat diet discussed and encouraged.   Lipid Panel  Lab Results  Component Value Date   CHOL 197 04/05/2014   HDL 56 04/05/2014   LDLCALC 130* 04/05/2014   TRIG 55 04/05/2014   CHOLHDL 3.5 04/05/2014   Reduced fat intake discussed Updated lab needed at/ before next visit.      Metabolic syndrome X The increased risk of cardiovascular disease associated with this diagnosis, and the need to consistently work on lifestyle to change this is discussed. Following  a  heart  healthy diet ,commitment to 30 minutes of exercise at least 5 days per week, as well as control of blood sugar and cholesterol , and achieving a healthy weight are all the areas to be addressed .   Overweight (BMI 25.0-29.9) Deteriorated. Patient re-educated about  the importance of commitment to a  minimum of 150 minutes of exercise per week.  The importance of healthy food choices with portion control discussed. Encouraged to start a food diary, count calories and to consider  joining a support group. Sample diet sheets offered. Goals set by the patient for the next several months.   Weight /BMI 10/12/2014 04/05/2014 10/31/2013  WEIGHT 173 lb 170 lb 173 lb 6.4 oz  HEIGHT 5\' 5"  5\' 5"  5\' 5"   BMI 28.79 kg/m2 28.29 kg/m2 28.86 kg/m2    Current exercise per week 120 minutes.   Nicotine dependence Patient counseled for approximately 5 minutes regarding the health risks of ongoing nicotine use, specifically all types of cancer, heart disease, stroke and respiratory failure. The options available for help with cessation ,the behavioral changes to assist the process, and the option to either gradully reduce usage  Or abruptly stop.is also discussed. Pt is also encouraged to set specific goals in number of cigarettes used daily, as well as to set a quit date.  Number of cigarettes/cigars currently smoking daily: 7    Insomnia Improved, uses sleep aid infrequently on as needed basis  Special screening for malignant neoplasms, colon Rectal exam: no mass or ulcer, hem negative stool       Review of Systems     Objective:   Physical Exam        Assessment & Plan:

## 2014-10-13 NOTE — Assessment & Plan Note (Signed)
Deteriorated. Patient re-educated about  the importance of commitment to a  minimum of 150 minutes of exercise per week.  The importance of healthy food choices with portion control discussed. Encouraged to start a food diary, count calories and to consider  joining a support group. Sample diet sheets offered. Goals set by the patient for the next several months.   Weight /BMI 10/12/2014 04/05/2014 10/31/2013  WEIGHT 173 lb 170 lb 173 lb 6.4 oz  HEIGHT 5\' 5"  5\' 5"  5\' 5"   BMI 28.79 kg/m2 28.29 kg/m2 28.86 kg/m2    Current exercise per week 120 minutes.

## 2014-10-13 NOTE — Assessment & Plan Note (Signed)
The increased risk of cardiovascular disease associated with this diagnosis, and the need to consistently work on lifestyle to change this is discussed. Following  a  heart healthy diet ,commitment to 30 minutes of exercise at least 5 days per week, as well as control of blood sugar and cholesterol , and achieving a healthy weight are all the areas to be addressed .  

## 2014-10-13 NOTE — Addendum Note (Signed)
Addended by: Eual Fines on: 10/13/2014 10:32 AM   Modules accepted: Orders

## 2014-10-13 NOTE — Assessment & Plan Note (Signed)
Improved, uses sleep aid infrequently on as needed basis

## 2014-10-13 NOTE — Assessment & Plan Note (Signed)

## 2014-10-13 NOTE — Assessment & Plan Note (Signed)
Hyperlipidemia:Low fat diet discussed and encouraged.   Lipid Panel  Lab Results  Component Value Date   CHOL 197 04/05/2014   HDL 56 04/05/2014   LDLCALC 130* 04/05/2014   TRIG 55 04/05/2014   CHOLHDL 3.5 04/05/2014   Reduced fat intake discussed Updated lab needed at/ before next visit.

## 2014-10-13 NOTE — Assessment & Plan Note (Signed)
Increased headache frequency and severity in past 2 weeks, reports increased work pressure in terms of hours with less sleep. Pt to work on lifestyle changes to improve both, start headache diary, fioricet for use in PM when migraine is severe, may need to add daily prophylactic medication, she will call back if no improvement in frequency in next 4 to 6 weeks

## 2014-10-13 NOTE — Assessment & Plan Note (Signed)
Rectal exam: no mass or ulcer, hem negative stool

## 2014-10-13 NOTE — Assessment & Plan Note (Signed)
Patient educated about the importance of limiting  Carbohydrate intake , the need to commit to daily physical activity for a minimum of 30 minutes , and to commit weight loss. The fact that changes in all these areas will reduce or eliminate all together the development of diabetes is stressed.   Diabetic Labs Latest Ref Rng 04/05/2014 04/18/2013 09/02/2012 05/05/2012 12/23/2011  HbA1c <5.7 % - 5.6 - - 5.7(H)  Chol 0 - 200 mg/dL 197 170 - 107 177  HDL >=46 mg/dL 56 39(L) - 33(L) 43  Calc LDL 0 - 99 mg/dL 130(H) 115(H) - 60 123(H)  Triglycerides <150 mg/dL 55 82 - 72 53  Creatinine 0.50 - 1.10 mg/dL 0.90 1.12(H) 0.97 1.04 1.06   BP/Weight 10/12/2014 04/05/2014 10/31/2013 07/28/2013 03/28/2013 11/03/2012 0/10/710  Systolic BP 197 588 325 498 264 158 309  Diastolic BP 82 82 86 74 78 84 81  Wt. (Lbs) 173 170 173.4 178 196.4 197.12 200  BMI 28.79 28.29 28.86 29.16 32.17 31.83 32.3   No flowsheet data found. Updated lab needed at/ before next visit.

## 2015-02-06 ENCOUNTER — Other Ambulatory Visit: Payer: Self-pay | Admitting: Family Medicine

## 2015-02-06 DIAGNOSIS — Z1231 Encounter for screening mammogram for malignant neoplasm of breast: Secondary | ICD-10-CM

## 2015-02-09 ENCOUNTER — Ambulatory Visit (HOSPITAL_COMMUNITY)
Admission: RE | Admit: 2015-02-09 | Discharge: 2015-02-09 | Disposition: A | Discharge status: Home / Self Care | Payer: BLUE CROSS/BLUE SHIELD | Source: Ambulatory Visit | Attending: Family Medicine | Admitting: Family Medicine

## 2015-02-09 DIAGNOSIS — Z1231 Encounter for screening mammogram for malignant neoplasm of breast: Secondary | ICD-10-CM | POA: Insufficient documentation

## 2015-02-23 ENCOUNTER — Encounter: Payer: Self-pay | Admitting: Adult Health

## 2015-02-23 ENCOUNTER — Other Ambulatory Visit (HOSPITAL_COMMUNITY)
Admission: RE | Admit: 2015-02-23 | Discharge: 2015-02-23 | Disposition: A | Discharge status: Home / Self Care | Payer: BLUE CROSS/BLUE SHIELD | Source: Ambulatory Visit | Attending: Adult Health | Admitting: Adult Health

## 2015-02-23 ENCOUNTER — Ambulatory Visit (INDEPENDENT_AMBULATORY_CARE_PROVIDER_SITE_OTHER): Payer: BLUE CROSS/BLUE SHIELD | Admitting: Adult Health

## 2015-02-23 VITALS — BP 130/90 | HR 70 | Ht 66.0 in | Wt 184.0 lb

## 2015-02-23 DIAGNOSIS — N926 Irregular menstruation, unspecified: Secondary | ICD-10-CM

## 2015-02-23 DIAGNOSIS — Z113 Encounter for screening for infections with a predominantly sexual mode of transmission: Secondary | ICD-10-CM | POA: Insufficient documentation

## 2015-02-23 DIAGNOSIS — Z1151 Encounter for screening for human papillomavirus (HPV): Secondary | ICD-10-CM | POA: Insufficient documentation

## 2015-02-23 DIAGNOSIS — Z1211 Encounter for screening for malignant neoplasm of colon: Secondary | ICD-10-CM

## 2015-02-23 DIAGNOSIS — Z01419 Encounter for gynecological examination (general) (routine) without abnormal findings: Secondary | ICD-10-CM

## 2015-02-23 DIAGNOSIS — Z01411 Encounter for gynecological examination (general) (routine) with abnormal findings: Secondary | ICD-10-CM

## 2015-02-23 DIAGNOSIS — N951 Menopausal and female climacteric states: Secondary | ICD-10-CM | POA: Diagnosis not present

## 2015-02-23 DIAGNOSIS — R232 Flushing: Secondary | ICD-10-CM | POA: Insufficient documentation

## 2015-02-23 HISTORY — DX: Menopausal and female climacteric states: N95.1

## 2015-02-23 HISTORY — DX: Flushing: R23.2

## 2015-02-23 HISTORY — DX: Irregular menstruation, unspecified: N92.6

## 2015-02-23 LAB — HEMOCCULT GUIAC POC 1CARD (OFFICE): FECAL OCCULT BLD: NEGATIVE

## 2015-02-23 NOTE — Progress Notes (Signed)
Patient ID: Chelsey Lewis, female   DOB: 1970-05-18, 45 y.o.   MRN: [id number] History of Present Illness: Chelsey Lewis is a 45 year old black female, G2P2, in for a well woman gyn exam and pap and is complaining of hot flashes and missed period in December and January. PCP is Dr Moshe Cipro.  Current Medications, Allergies, Past Medical History, Past Surgical History, Family History and Social History were reviewed in [employer] record.     Review of Systems: Patient denies any headaches, hearing loss, fatigue, blurred vision, shortness of breath, chest pain, abdominal pain, problems with bowel movements, urination, or intercourse. No joint pain or mood swings.See HPI for positives. Has only 1 ovary she says.    Physical Exam:BP 130/90 mmHg  Pulse 70  Ht 5\' 6"  (1.676 m)  Wt 184 lb (83.462 kg)  BMI 29.71 kg/m2  LMP 12/11/2014 (Approximate) General:  Well developed, well nourished, no acute distress Skin:  Warm and dry Neck:  Midline trachea, normal thyroid, good ROM, no lymphadenopathy Lungs; Clear to auscultation bilaterally Breast:  No dominant palpable mass, retraction, or nipple discharge Cardiovascular: Regular rate and rhythm Abdomen:  Soft, non tender, no hepatosplenomegaly Pelvic:  External genitalia is normal in appearance, no lesions.  The vagina is normal in appearance. Urethra has no lesions or masses. The cervix is bulbous, pap with HPV and GC/CHL performed.  Uterus is felt to be normal size, shape, and contour.  No adnexal masses or tenderness noted.Bladder is non tender, no masses felt. Rectal: Good sphincter tone, no polyps, or hemorrhoids felt.  Hemoccult negative. Extremities/musculoskeletal:  No swelling or varicosities noted, no clubbing or cyanosis Psych:  No mood changes, alert and cooperative,seems happy Discussed peri menopause symptoms.  Impression: Well woman gyn exam and pap Hot flashes Irregular periods Peri menopausal     Plan: Check  CBC, TSH and FSH Physical in 1 year, pap in 3 if normal Mammogram yearly Follow up in 1 week to discuss labs and treatment options

## 2015-02-23 NOTE — Patient Instructions (Addendum)
Perimenopause Perimenopause is the time when your body begins to move into the menopause (no menstrual period for 12 straight months). It is a natural process. Perimenopause can begin 2-8 years before the menopause and usually lasts for 1 year after the menopause. During this time, your ovaries may or may not produce an egg. The ovaries vary in their production of estrogen and progesterone hormones each month. This can cause irregular menstrual periods, difficulty getting pregnant, vaginal bleeding between periods, and uncomfortable symptoms. CAUSES  Irregular production of the ovarian hormones, estrogen and progesterone, and not ovulating every month.  Other causes include:  Tumor of the pituitary gland in the brain.  Medical disease that affects the ovaries.  Radiation treatment.  Chemotherapy.  Unknown causes.  Heavy smoking and excessive alcohol intake can bring on perimenopause sooner. SIGNS AND SYMPTOMS   Hot flashes.  Night sweats.  Irregular menstrual periods.  Decreased sex drive.  Vaginal dryness.  Headaches.  Mood swings.  Depression.  Memory problems.  Irritability.  Tiredness.  Weight gain.  Trouble getting pregnant.  The beginning of losing bone cells (osteoporosis).  The beginning of hardening of the arteries (atherosclerosis). DIAGNOSIS  Your health care provider will make a diagnosis by analyzing your age, menstrual history, and symptoms. He or she will do a physical exam and note any changes in your body, especially your female organs. Female hormone tests may or may not be helpful depending on the amount of female hormones you produce and when you produce them. However, other hormone tests may be helpful to rule out other problems. TREATMENT  In some cases, no treatment is needed. The decision on whether treatment is necessary during the perimenopause should be made by you and your health care provider based on how the symptoms are affecting you  and your lifestyle. Various treatments are available, such as:  Treating individual symptoms with a specific medicine for that symptom.  Herbal medicines that can help specific symptoms.  Counseling.  Group therapy. HOME CARE INSTRUCTIONS   Keep track of your menstrual periods (when they occur, how heavy they are, how long between periods, and how long they last) as well as your symptoms and when they started.  Only take over-the-counter or prescription medicines as directed by your health care provider.  Sleep and rest.  Exercise.  Eat a diet that contains calcium (good for your bones) and soy (acts like the estrogen hormone).  Do not smoke.  Avoid alcoholic beverages.  Take vitamin supplements as recommended by your health care provider. Taking vitamin E may help in certain cases.  Take calcium and vitamin D supplements to help prevent bone loss.  Group therapy is sometimes helpful.  Acupuncture may help in some cases. SEEK MEDICAL CARE IF:   You have questions about any symptoms you are having.  You need a referral to a specialist (gynecologist, psychiatrist, or psychologist). SEEK IMMEDIATE MEDICAL CARE IF:   You have vaginal bleeding.  Your period lasts longer than 8 days.  Your periods are recurring sooner than 21 days.  You have bleeding after intercourse.  You have severe depression.  You have pain when you urinate.  You have severe headaches.  You have vision problems.   This information is not intended to replace advice given to you by your health care provider. Make sure you discuss any questions you have with your health care provider.   Document Released: 02/07/2004 Document Revised: 01/20/2014 Document Reviewed: 07/29/2012 Elsevier Interactive Patient Education [employer].  Return in 1 week for labs review and treatment options  Hormone Therapy At menopause, your body begins making less estrogen and progesterone hormones. This causes  the body to stop having menstrual periods. This is because estrogen and progesterone hormones control your periods and menstrual cycle. A lack of estrogen may cause symptoms such as:  Hot flushes (or hot flashes).  Vaginal dryness.  Dry skin.  Loss of sex drive.  Risk of bone loss (osteoporosis). When this happens, you may choose to take hormone therapy to get back the estrogen lost during menopause. When the hormone estrogen is given alone, it is usually referred to as ET (Estrogen Therapy). When the hormone progestin is combined with estrogen, it is generally called HT (Hormone Therapy). This was formerly known as hormone replacement therapy (HRT). Your caregiver can help you make a decision on what will be best for you. The decision to use HT seems to change often as new studies are done. Many studies do not agree on the benefits of hormone replacement therapy. LIKELY BENEFITS OF HT INCLUDE PROTECTION FROM:  Hot Flushes (also called hot flashes) - A hot flush is a sudden feeling of heat that spreads over the face and body. The skin may redden like a blush. It is connected with sweats and sleep disturbance. Women going through menopause may have hot flushes a few times a month or several times per day depending on the woman.  Osteoporosis (bone loss) - Estrogen helps guard against bone loss. After menopause, a woman's bones slowly lose calcium and become weak and brittle. As a result, bones are more likely to break. The hip, wrist, and spine are affected most often. Hormone therapy can help slow bone loss after menopause. Weight bearing exercise and taking calcium with vitamin D also can help prevent bone loss. There are also medications that your caregiver can prescribe that can help prevent osteoporosis.  Vaginal dryness - Loss of estrogen causes changes in the vagina. Its lining may become thin and dry. These changes can cause pain and bleeding during sexual intercourse. Dryness can also lead  to infections. This can cause burning and itching. (Vaginal estrogen treatment can help relieve pain, itching, and dryness.)  Urinary tract infections are more common after menopause because of lack of estrogen. Some women also develop urinary incontinence because of low estrogen levels in the vagina and bladder.  Possible other benefits of estrogen include a positive effect on mood and short-term memory in women. RISKS AND COMPLICATIONS  Using estrogen alone without progesterone causes the lining of the uterus to grow. This increases the risk of lining of the uterus (endometrial) cancer. Your caregiver should give another hormone called progestin if you have a uterus.  Women who take combined (estrogen and progestin) HT appear to have an increased risk of breast cancer. The risk appears to be small, but increases throughout the time that HT is taken.  Combined therapy also makes the breast tissue slightly denser which makes it harder to read mammograms (breast X-rays).  Combined, estrogen and progesterone therapy can be taken together every day, in which case there may be spotting of blood. HT therapy can be taken cyclically in which case you will have menstrual periods. Cyclically means HT is taken for a set amount of days, then not taken, then this process is repeated.  HT may increase the risk of stroke, heart attack, breast cancer and forming blood clots in your leg.  Transdermal estrogen (estrogen that is absorbed through the  skin with a patch or a cream) may have better results with:  Cholesterol.  Blood pressure.  Blood clots. Having the following conditions may indicate you should not have HT:  Endometrial cancer.  Liver disease.  Breast cancer.  Heart disease.  History of blood clots.  Stroke. TREATMENT   If you choose to take HT and have a uterus, usually estrogen and progestin are prescribed.  Your caregiver will help you decide the best way to take the  medications.  Possible ways to take estrogen include:  Pills.  Patches.  Gels.  Sprays.  Vaginal estrogen cream, rings and tablets.  It is best to take the lowest dose possible that will help your symptoms and take them for the shortest period of time that you can.  Hormone therapy can help relieve some of the problems (symptoms) that affect women at menopause. Before making a decision about HT, talk to your caregiver about what is best for you. Be well informed and comfortable with your decisions. HOME CARE INSTRUCTIONS   Follow your caregivers advice when taking the medications.  A Pap test is done to screen for cervical cancer.  The first Pap test should be done at age 85.  Between ages 57 and 72, Pap tests are repeated every 2 years.  Beginning at age 61, you are advised to have a Pap test every 3 years as long as the past 3 Pap tests have been normal.  Some women have medical problems that increase the chance of getting cervical cancer. Talk to your caregiver about these problems. It is especially important to talk to your caregiver if a new problem develops soon after your last Pap test. In these cases, your caregiver may recommend more frequent screening and Pap tests.  The above recommendations are the same for women who have or have not gotten the vaccine for HPV (human papillomavirus).  If you had a hysterectomy for a problem that was not a cancer or a condition that could lead to cancer, then you no longer need Pap tests. However, even if you no longer need a Pap test, a regular exam is a good idea to make sure no other problems are starting.  If you are between ages 46 and 53, and you have had normal Pap tests going back 10 years, you no longer need Pap tests. However, even if you no longer need a Pap test, a regular exam is a good idea to make sure no other problems are starting.  If you have had past treatment for cervical cancer or a condition that could lead to  cancer, you need Pap tests and screening for cancer for at least 20 years after your treatment.  If Pap tests have been discontinued, risk factors (such as a new sexual partner)need to be re-assessed to determine if screening should be resumed.  Some women may need screenings more often if they are at high risk for cervical cancer.  Get mammograms done as per the advice of your caregiver. SEEK IMMEDIATE MEDICAL CARE IF:  You develop abnormal vaginal bleeding.  You have pain or swelling in your legs, shortness of breath, or chest pain.  You develop dizziness or headaches.  You have lumps or changes in your breasts or armpits.  You have slurred speech.  You develop weakness or numbness of your arms or legs.  You have pain, burning, or bleeding when urinating.  You develop abdominal pain.   This information is not intended to replace advice given  to you by your health care provider. Make sure you discuss any questions you have with your health care provider.   Document Released: 09/28/2002 Document Revised: 05/16/2014 Document Reviewed: 07/03/2014 Elsevier Interactive Patient Education [employer].

## 2015-02-24 LAB — FOLLICLE STIMULATING HORMONE: FSH: 96 m[IU]/mL

## 2015-02-24 LAB — CBC
HEMOGLOBIN: 11.6 g/dL (ref 11.1–15.9)
Hematocrit: 36.2 % (ref 34.0–46.6)
MCH: 25.6 pg — AB (ref 26.6–33.0)
MCHC: 32 g/dL (ref 31.5–35.7)
MCV: 80 fL (ref 79–97)
Platelets: 366 10*3/uL (ref 150–379)
RBC: 4.54 x10E6/uL (ref 3.77–5.28)
RDW: 18.3 % — ABNORMAL HIGH (ref 12.3–15.4)
WBC: 7 10*3/uL (ref 3.4–10.8)

## 2015-02-24 LAB — TSH: TSH: 2.81 u[IU]/mL (ref 0.450–4.500)

## 2015-02-28 LAB — CYTOLOGY - PAP

## 2015-03-02 ENCOUNTER — Encounter: Payer: Self-pay | Admitting: Adult Health

## 2015-03-02 ENCOUNTER — Ambulatory Visit (INDEPENDENT_AMBULATORY_CARE_PROVIDER_SITE_OTHER): Payer: BLUE CROSS/BLUE SHIELD | Admitting: Adult Health

## 2015-03-02 VITALS — BP 140/92 | HR 66 | Ht 66.0 in | Wt 186.0 lb

## 2015-03-02 DIAGNOSIS — R232 Flushing: Secondary | ICD-10-CM

## 2015-03-02 DIAGNOSIS — Z780 Asymptomatic menopausal state: Secondary | ICD-10-CM | POA: Diagnosis not present

## 2015-03-02 DIAGNOSIS — N951 Menopausal and female climacteric states: Secondary | ICD-10-CM

## 2015-03-02 HISTORY — DX: Asymptomatic menopausal state: Z78.0

## 2015-03-02 NOTE — Progress Notes (Signed)
Subjective:     Patient ID: Chelsey Lewis, female   DOB: 1970-12-06, 45 y.o.   MRN: [id number]  HPI Chelsey Lewis is a 45 year old black female, married, who had pap and physical and labs last week in to discuss labs and treatment for hot flashes.   Review of Systems Patient denies any headaches, hearing loss, fatigue, blurred vision, shortness of breath, chest pain, abdominal pain, problems with bowel movements, urination, or intercourse. No joint pain or mood swings.+ HOT flashes and irregular periods Reviewed past medical,surgical, social and family history. Reviewed medications and allergies.     Objective:   Physical Exam BP 140/92 mmHg  Pulse 66  Ht 5\' 6"  (1.676 m)  Wt 186 lb (84.369 kg)  BMI 30.04 kg/m2  LMP 12/11/2014 (Approximate)Discussed labs, TSH 2.810, CBC is WNL and FSH is 96, pap was negative, with negative HPV and GC/CHL.Discussed risk and benefits of HRT or SSRI for hot flashes or can just follow for now and she will just watch for now.Did tell her could still have random period, but if has been a year of no bleeding then bleeds to call me will get [place] to assess. Face time 10 minutes.     Assessment:     Postmenopausal  Hot flashes     Plan:     Increase exercise Follow up prn Review handout on menopause

## 2015-03-02 NOTE — Patient Instructions (Signed)
Menopause Menopause is the normal time of life when menstrual periods stop completely. Menopause is complete when you have missed 12 consecutive menstrual periods. It usually occurs between the ages of 48 years and 55 years. Very rarely does a woman develop menopause before the age of 40 years. At menopause, your ovaries stop producing the female hormones estrogen and progesterone. This can cause undesirable symptoms and also affect your health. Sometimes the symptoms may occur 4-5 years before the menopause begins. There is no relationship between menopause and:  Oral contraceptives.  Number of children you had.  Race.  The age your menstrual periods started (menarche). Heavy smokers and very thin women may develop menopause earlier in life. CAUSES  The ovaries stop producing the female hormones estrogen and progesterone.  Other causes include:  Surgery to remove both ovaries.  The ovaries stop functioning for no known reason.  Tumors of the pituitary gland in the brain.  Medical disease that affects the ovaries and hormone production.  Radiation treatment to the abdomen or pelvis.  Chemotherapy that affects the ovaries. SYMPTOMS   Hot flashes.  Night sweats.  Decrease in sex drive.  Vaginal dryness and thinning of the vagina causing painful intercourse.  Dryness of the skin and developing wrinkles.  Headaches.  Tiredness.  Irritability.  Memory problems.  Weight gain.  Bladder infections.  Hair growth of the face and chest.  Infertility. More serious symptoms include:  Loss of bone (osteoporosis) causing breaks (fractures).  Depression.  Hardening and narrowing of the arteries (atherosclerosis) causing heart attacks and strokes. DIAGNOSIS   When the menstrual periods have stopped for 12 straight months.  Physical exam.  Hormone studies of the blood. TREATMENT  There are many treatment choices and nearly as many questions about them. The  decisions to treat or not to treat menopausal changes is an individual choice made with your health care provider. Your health care provider can discuss the treatments with you. Together, you can decide which treatment will work best for you. Your treatment choices may include:   Hormone therapy (estrogen and progesterone).  Non-hormonal medicines.  Treating the individual symptoms with medicine (for example antidepressants for depression).  Herbal medicines that may help specific symptoms.  Counseling by a psychiatrist or psychologist.  Group therapy.  Lifestyle changes including:  Eating healthy.  Regular exercise.  Limiting caffeine and alcohol.  Stress management and meditation.  No treatment. HOME CARE INSTRUCTIONS   Take the medicine your health care provider gives you as directed.  Get plenty of sleep and rest.  Exercise regularly.  Eat a diet that contains calcium (good for the bones) and soy products (acts like estrogen hormone).  Avoid alcoholic beverages.  Do not smoke.  If you have hot flashes, dress in layers.  Take supplements, calcium, and vitamin D to strengthen bones.  You can use over-the-counter lubricants or moisturizers for vaginal dryness.  Group therapy is sometimes very helpful.  Acupuncture may be helpful in some cases. SEEK MEDICAL CARE IF:   You are not sure you are in menopause.  You are having menopausal symptoms and need advice and treatment.  You are still having menstrual periods after age 55 years.  You have pain with intercourse.  Menopause is complete (no menstrual period for 12 months) and you develop vaginal bleeding.  You need a referral to a specialist (gynecologist, psychiatrist, or psychologist) for treatment. SEEK IMMEDIATE MEDICAL CARE IF:   You have severe depression.  You have excessive vaginal bleeding.    You fell and think you have a broken bone.  You have pain when you urinate.  You develop leg or  chest pain.  You have a fast pounding heart beat (palpitations).  You have severe headaches.  You develop vision problems.  You feel a lump in your breast.  You have abdominal pain or severe indigestion.   This information is not intended to replace advice given to you by your health care provider. Make sure you discuss any questions you have with your health care provider.   Document Released: 03/22/2003 Document Revised: 09/01/2012 Document Reviewed: 07/29/2012 Elsevier Interactive Patient Education 2016 [employer]. Increase exercise

## 2015-03-13 ENCOUNTER — Encounter: Payer: Self-pay | Admitting: Family Medicine

## 2015-05-14 ENCOUNTER — Telehealth: Payer: Self-pay | Admitting: Family Medicine

## 2015-05-14 NOTE — Telephone Encounter (Signed)
Went to Urgent Care on 04/09/15 for Lft Ankle Pain, Lft Ankle keeps swelling up and causing her to limp after going back on Monday 05/07/15. She is asking to be seen so her Return to work form given by her Employer can be addressed, please advise?

## 2015-05-14 NOTE — Telephone Encounter (Signed)
Spoke with patient and she is to see dr. Luna Glasgow on Wednesday. She will see if that office will fill out form.  She states that she was out of work for 5 weeks due to ankle.

## 2015-05-16 ENCOUNTER — Ambulatory Visit (INDEPENDENT_AMBULATORY_CARE_PROVIDER_SITE_OTHER): Payer: BLUE CROSS/BLUE SHIELD | Admitting: Orthopaedic Surgery

## 2015-05-16 ENCOUNTER — Encounter: Payer: Self-pay | Admitting: Orthopaedic Surgery

## 2015-05-16 VITALS — BP 137/84 | HR 79 | Temp 97.7°F | Resp 16 | Ht 66.0 in | Wt 192.0 lb

## 2015-05-16 DIAGNOSIS — J3089 Other allergic rhinitis: Secondary | ICD-10-CM | POA: Diagnosis not present

## 2015-05-16 DIAGNOSIS — M25572 Pain in left ankle and joints of left foot: Secondary | ICD-10-CM

## 2015-05-16 MED ORDER — NAPROXEN 500 MG PO TABS
500.0000 mg | ORAL_TABLET | Freq: Two times a day (BID) | ORAL | Status: DC
Start: 2015-05-16 — End: 2015-07-19

## 2015-05-16 NOTE — Progress Notes (Signed)
Subjective: My left ankle hurts    Patient ID: Chelsey Lewis, female    DOB: 1970/11/19, 45 y.o.   MRN: [id number]  Ankle Pain  The incident occurred more than 1 week ago. The incident occurred at home. The injury mechanism was a twisting injury. The pain is present in the left ankle. The quality of the pain is described as aching. The pain is at a severity of 3/10. The pain is mild. The pain has been fluctuating since onset. Pertinent negatives include no inability to bear weight, loss of motion, loss of sensation, muscle weakness, numbness or tingling. The symptoms are aggravated by weight bearing. She has tried ice, heat and immobilization for the symptoms. The treatment provided moderate relief.   She hurt her left ankle on 04/08/15.  She was seen by Dr. Jomarie Longs on 04/09/15.  X-rays were negative.  She was placed on Relafen 750.  I have reviewed his notes, the x-rays and the x-ray report.  She has continued swelling of the left ankle.  She has an ankle brace she uses at work.  By the end of the eight hour shift, her ankle is hurting and swelling.  She has no new injury,no redness, no new trauma.  She is concerned it is still hurting and swelling.   Review of Systems  HENT: Negative for congestion.   Respiratory: Negative for cough and shortness of breath.   Cardiovascular: Negative for chest pain and leg swelling.  Endocrine: Positive for cold intolerance.  Musculoskeletal: Positive for joint swelling, arthralgias and gait problem.  Allergic/Immunologic: Positive for environmental allergies.  Neurological: Negative for tingling and numbness.   Past Medical History  Diagnosis Date  . Vaginal discharge   . Hyperlipidemia   . Overweight(278.02)   . Well adult exam   . Tobacco abuse   . Migraine headache   . Constipation   . Tubal pregnancy   . Anemia   . DUB (dysfunctional uterine bleeding) 05/05/2012  . Peri-menopausal 05/12/2012  . Hot flashes 02/23/2015  . Irregular  periods/menstrual cycles 02/23/2015  . Peri-menopause 02/23/2015  . Postmenopausal 03/02/2015    Past Surgical History  Procedure Laterality Date  . Oopherectomy and tubal ligation    . Ectopic pregnancy surgery    . 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 Tubal ligation      No current outpatient prescriptions on file prior to visit.   No current facility-administered medications on file prior to visit.    Social History   Social History  . Marital Status: Married    Spouse Name: N/A  . Number of Children: 2  . Years of Education: N/A   Occupational History  . Risk analyst     Social History Main Topics  . Smoking status: Current Every Day Smoker -- 0.50 packs/day    Types: Cigarettes  . Smokeless tobacco: Never Used  . Alcohol Use: No  . Drug Use: No  . Sexual Activity: Yes    Birth Control/ Protection: Surgical     Comment: tubal   Other Topics Concern  . Not on file   Social History Narrative    BP 137/84 mmHg  Pulse 79  Temp(Src) 97.7 F (36.5 C)  Resp 16  Ht 5\' 6"  (1.676 m)  Wt 192 lb (87.091 kg)  BMI 31.00 kg/m2     Objective:   Physical Exam  Constitutional: She is oriented to person, place, and  time. She appears well-developed and well-nourished.  HENT:  Head: Normocephalic and atraumatic.  Eyes: Conjunctivae and EOM are normal. Pupils are equal, round, and reactive to light.  Neck: Normal range of motion. Neck supple.  Cardiovascular: Normal rate, regular rhythm and intact distal pulses.   Pulmonary/Chest: Effort normal.  Abdominal: Soft.  Musculoskeletal: She exhibits tenderness (The left ankle is diffusely swollen, more tender on the medial side, no redness, no ecchymosis, full ROM, NV intact.).       Left ankle: She exhibits swelling. She exhibits normal range of motion. Tenderness. Medial malleolus tenderness found.       Feet:  Neurological: She is alert and oriented to person, place,  and time. She displays normal reflexes. No cranial nerve deficit. She exhibits normal muscle tone. Coordination normal.  Skin: Skin is warm and dry.  Psychiatric: She has a normal mood and affect. Her behavior is normal. Judgment and thought content normal.        Assessment & Plan:   Encounter Diagnoses  Name Primary?  . Left ankle pain Yes  . Other allergic rhinitis     I have told her about contrast baths and given a sheet of instruction.  I will begin PT for her.  I have filled out form for her work.  Use Aspercreme or the like. Begin Naprosyn.  Return in two weeks.  Call if any problem.  Precautions discussed.

## 2015-05-16 NOTE — Patient Instructions (Addendum)
Call hospital to arrange PT   Do contrast baths Use Aspercreme after you do the soak and rub in very well

## 2015-05-23 ENCOUNTER — Ambulatory Visit (HOSPITAL_COMMUNITY): Payer: BLUE CROSS/BLUE SHIELD | Admitting: Physical Therapy

## 2015-05-29 ENCOUNTER — Ambulatory Visit (HOSPITAL_COMMUNITY): Payer: BLUE CROSS/BLUE SHIELD | Attending: Orthopaedic Surgery | Admitting: Physical Therapy

## 2015-05-29 ENCOUNTER — Telehealth: Payer: Self-pay | Admitting: Family Medicine

## 2015-05-29 DIAGNOSIS — M6281 Muscle weakness (generalized): Secondary | ICD-10-CM | POA: Diagnosis present

## 2015-05-29 DIAGNOSIS — R262 Difficulty in walking, not elsewhere classified: Secondary | ICD-10-CM

## 2015-05-29 DIAGNOSIS — R7303 Prediabetes: Secondary | ICD-10-CM

## 2015-05-29 DIAGNOSIS — D649 Anemia, unspecified: Secondary | ICD-10-CM

## 2015-05-29 DIAGNOSIS — M25672 Stiffness of left ankle, not elsewhere classified: Secondary | ICD-10-CM | POA: Diagnosis present

## 2015-05-29 DIAGNOSIS — E785 Hyperlipidemia, unspecified: Secondary | ICD-10-CM

## 2015-05-29 DIAGNOSIS — M25572 Pain in left ankle and joints of left foot: Secondary | ICD-10-CM

## 2015-05-29 DIAGNOSIS — R600 Localized edema: Secondary | ICD-10-CM | POA: Diagnosis present

## 2015-05-29 NOTE — Telephone Encounter (Signed)
Patient is asking for her lab orders to be ready to be picked up in the morning, Thanks!!

## 2015-05-29 NOTE — Telephone Encounter (Signed)
Labs ordered and put up front

## 2015-05-29 NOTE — Patient Instructions (Signed)
   ANKLE CIRCLES  Move your ankle in a circular pattern clockwise 10-15 times, then move ankle counter-clockwise 10-15 times.  Repeat 2-3 times per day.     ANKLE ABC's   While in a seated position, write out the alphabet in the air with your big toe.  Your ankle should be moving as you perform this.  Write out the letters A-Z 1-2 times, twice per day.      Ankle Pump  Sit at edge of chair, bed or plynth table. Pull foot back toward you, as you feel calf muscle stretch out.  Repeat 10-15 times, 2-3 times per day.    Ice  Ice for 15-20 minutes every 2-3 hours while awake.  Remove ice if the area becomes numb. Place a pillow case or towel between you and the ice pack.   Also make sure you are regularly elevating the ankle as well in order to assist in helping with managing swelling and pain.

## 2015-05-29 NOTE — Therapy (Signed)
[facility] [address] [place], [place], [address] Phone: [phone]   Fax:  [phone]  Physical Therapy Evaluation  Patient Details  Name: Chelsey Lewis MRN: [id number] Date of Birth: 1970-10-06 Referring Provider: Sanjuana Kava   Encounter Date: 05/29/2015      PT End of Session - 05/29/15 1553    Visit Number 1   Number of Visits 12   Date for PT Re-Evaluation 06/19/15   Authorization Type BCBS (30 visit limit)   Authorization Time Period 05/29/2015 thru 07/10/2015   PT Start Time 1348   PT Stop Time 1427   PT Time Calculation (min) 39 min   Activity Tolerance Patient tolerated treatment well   Behavior During Therapy [facility] for tasks assessed/performed      Past Medical History  Diagnosis Date  . Vaginal discharge   . Hyperlipidemia   . Overweight(278.02)   . Well adult exam   . Tobacco abuse   . Migraine headache   . Constipation   . Tubal pregnancy   . Anemia   . DUB (dysfunctional uterine bleeding) 05/05/2012  . Peri-menopausal 05/12/2012  . Hot flashes 02/23/2015  . Irregular periods/menstrual cycles 02/23/2015  . Peri-menopause 02/23/2015  . Postmenopausal 03/02/2015    Past Surgical History  Procedure Laterality Date  . Oopherectomy and tubal ligation    . Ectopic pregnancy surgery    . 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 Tubal ligation      There were no vitals filed for this visit.       Subjective Assessment - 05/29/15 1350    Subjective Patient reports that she sprained her L ankle on the 27th; she was taken out of work and was spent to specialist, who referred her here since her ankle has not healed completely by now. It is not typically sore when she is walking, but it will swell and start to hurt after awhile; she cannot run right now. No falls or close calls recently. She has been wearing a brace and has been using contrast baths as well.     Pertinent History pre-diabetic, nicotine dependence   How long can you stand comfortably? 3 hours    How long can you walk comfortably? 3 hours    Patient Stated Goals get ankle back to normal    Currently in Pain? No/denies  at worst, pain gets to 4-5/10            [facility] PT Assessment - 05/29/15 0001    Assessment   Medical Diagnosis L ankle pain    Referring Provider Sanjuana Kava    Onset Date/Surgical Date 05/10/15   Next MD Visit Dr. Luna Glasgow soon (maybe sometime this week)   Balance Screen   Has the patient fallen in the past 6 months No   Has the patient had a decrease in activity level because of a fear of falling?  No   Is the patient reluctant to leave their home because of a fear of falling?  No   Prior Function   Level of Independence Independent;Independent with basic ADLs;Independent with gait;Independent with transfers   Vocation Full time employment   Vocation Requirements on feet and walking 12 hour shifts    Leisure woodworking, planting flowers   Observation/Other Assessments   Observations edema noted lateral L ankle with tenderness along lateral ankle musculature L; increased laxity L ankle with anterior drawer test  as compared to R    Focus on Therapeutic Outcomes (FOTO)  44% limited    Sensation   Additional Comments edema 40% impaired, but reports that her entire calf can swell with extended standing.    AROM   Right Ankle Dorsiflexion 6   Right Ankle Plantar Flexion --  wfl    Right Ankle Inversion 30   Right Ankle Eversion 9   Left Ankle Dorsiflexion 9   Left Ankle Plantar Flexion --  wfl    Left Ankle Inversion 12  some hip/LE compenstaion for ROM    Left Ankle Eversion 4  mild hip/LE compensation for ROM    Strength   Right Hip Flexion 4/5   Right Hip Extension 3/5   Right Hip ABduction 4+/5   Left Hip Flexion 4/5   Left Hip Extension 3+/5   Left Hip ABduction 3+/5   Right Knee Flexion 3+/5   Right Knee Extension 4/5   Left Knee Flexion  4+/5   Left Knee Extension 4/5   Right Ankle Dorsiflexion 4+/5   Right Ankle Inversion 4+/5   Right Ankle Eversion 4+/5   Left Ankle Dorsiflexion 4/5   Left Ankle Inversion 4/5   Left Ankle Eversion 4/5   Palpation   Palpation comment R ankle WLF supination/pronation; stiff calcaneal eversion and talar DF; L ankle supination/pronation stiff, also L calcaneous stiff for inversion and eversion, talar DF quite stiff as well    Ambulation/Gait   Gait Comments hip ER, reduced ankle motion (especially dorisflexion on L), slight difference in stance times between LEs with L being less    6 minute walk test results    Aerobic Endurance Distance Walked 658   Endurance additional comments 3MWT                            PT Education - 05/29/15 1558    Education provided Yes   Education Details prognosis, plan of care, use ice rather than contrast bath to control swelling, HEP    Person(s) Educated Patient   Methods Explanation;Handout;Demonstration   Comprehension Verbalized understanding;Need further instruction;Returned demonstration          PT Short Term Goals - 05/29/15 1605    PT SHORT TERM GOAL #1   Title Patient will have B ankle range WFL in order to reduce pain and improve mechanics.    Time 3   Period Weeks   Status New   PT SHORT TERM GOAL #2   Title Pt will experience no more than 2/10 pain at worse with a full shift at work in order to improve QOL.    Time 3   Period Weeks   Status New   PT SHORT TERM GOAL #3   Title Pt will demonstrate no edema throughout L ankle in order to demonstrate overal improvement of condition, and will also demonstrate only minimal limittation throughout L ankle joint in order to demonstrate improve mechanics.     Time 3   Period Weeks   Status New   PT SHORT TERM GOAL #4   Title Pt will be independent with HEP, to be updated PRN   Time 1   Period Weeks   Status New           PT Long Term Goals - 05/29/15 1608     PT LONG TERM GOAL #1   Title Pt will demonstrate at least 5/5 in all tested muscle groups to improve  stability and dynamic functional performance.    Time 6   Period Weeks   Status New   PT LONG TERM GOAL #2   Title Pt will be able to tolerate a full shift at work with no edema in L ankle, and pain in L ankle no more than 1/10 in order to improve QOL   Time 6   Period Weeks   Status New   PT LONG TERM GOAL #3   Title Pt will be able to perform moderate level activities for at least 20 min 3x's/wk in order to improve overall health and maintain functional gains.    Time 6   Period Weeks   Status New               Plan - 05/29/15 1555    Clinical Impression Statement Patient arrives reporting pain and edema from an inversion ankle sprain that occured around 05/10/15; she has been able to return to full shifts at work but continues to have pain and her edema can get to the point that it is engulfing her calf after a full shift on her feet. Upon exmaination patient does reveal consdirable ankle stiffness, significant localized edema, functional LE muscle weakness, stiffness of bilatearl calcaneious and talus as well as signifcant stiffness of L foot supination/pronation, milld impairemnt with gait mechanics, and ankle instability on L with anterior drawer ankle test. Patient also displays signs and  symptoms of chornic L ankle sprain, included edema, tenerness to palpation, and difficulty with inversion/eversion. At this piont recommend skilled PT services in order to address functional limtiations and assist in reaching optimal level of function.    Rehab Potential Excellent   PT Frequency 2x / week   PT Duration 6 weeks   PT Treatment/Interventions ADLs/Self Care Home Management;Cryotherapy;Gait training;Stair training;Therapeutic activities;Therapeutic exercise;Balance training;Neuromuscular re-education;Patient/family education;Manual techniques;Passive range of motion;Energy  conservation;Taping   PT Next Visit Plan Review initial eval and goals; focus on ankle mobility and edema, progress to stregthening and closed chain as edema and range improve, balance, gait, and manual PRN.    PT Home Exercise Plan given   Consulted and Agree with Plan of Care Patient      Patient will benefit from skilled therapeutic intervention in order to improve the following deficits and impairments:  Abnormal gait, Hypomobility, Increased edema, Decreased activity tolerance, Decreased strength, Pain, Difficulty walking, Impaired flexibility, Decreased coordination  Visit Diagnosis: Pain in left ankle and joints of left foot - Plan: PT plan of care cert/re-cert  Localized edema - Plan: PT plan of care cert/re-cert  Stiffness of left ankle, not elsewhere classified - Plan: PT plan of care cert/re-cert  Difficulty in walking, not elsewhere classified - Plan: PT plan of care cert/re-cert  Muscle weakness (generalized) - Plan: PT plan of care cert/re-cert     Problem List Patient Active Problem List   Diagnosis Date Noted  . Postmenopausal 03/02/2015  . Hot flashes 02/23/2015  . Irregular periods/menstrual cycles 02/23/2015  . Peri-menopause 02/23/2015  . Special screening for malignant neoplasms, colon 10/13/2014  . Allergic rhinitis 06/30/2013  . Metabolic syndrome X [id number]  . Nicotine dependence 01/09/2013  . Vitamin D deficiency 11/03/2012  . Peri-menopausal 05/12/2012  . Anemia 05/05/2012  . Insomnia 12/24/2011  . Prediabetes 09/28/2009  . GOITER 04/09/2009  . Dyslipidemia, goal LDL below 100 04/13/2007  . Overweight (BMI 25.0-29.9) 04/13/2007  . ANEMIA-NOS 12/28/2005  . Migraine 12/28/2005    Deniece Ree PT, DPT [phone]  [facility]  [facility] [place], [place], [address] Phone: [phone]   Fax:  [phone]  Name: Chelsey Lewis MRN: [id number] Date of Birth: [birth year]

## 2015-05-31 ENCOUNTER — Ambulatory Visit: Payer: BLUE CROSS/BLUE SHIELD | Admitting: Orthopaedic Surgery

## 2015-06-04 ENCOUNTER — Telehealth (HOSPITAL_COMMUNITY): Payer: Self-pay | Admitting: Physical Therapy

## 2015-06-04 ENCOUNTER — Ambulatory Visit (HOSPITAL_COMMUNITY): Payer: BLUE CROSS/BLUE SHIELD | Admitting: Physical Therapy

## 2015-06-04 NOTE — Telephone Encounter (Signed)
Pt did not show for appt.  Called and reminded of next scheduled appt.  Pt unable to give reason for missing appointment.  Teena Irani, PTA/CLT [phone]

## 2015-06-06 ENCOUNTER — Ambulatory Visit (HOSPITAL_COMMUNITY): Payer: BLUE CROSS/BLUE SHIELD

## 2015-06-06 DIAGNOSIS — R262 Difficulty in walking, not elsewhere classified: Secondary | ICD-10-CM

## 2015-06-06 DIAGNOSIS — M25672 Stiffness of left ankle, not elsewhere classified: Secondary | ICD-10-CM

## 2015-06-06 DIAGNOSIS — M25572 Pain in left ankle and joints of left foot: Secondary | ICD-10-CM | POA: Diagnosis not present

## 2015-06-06 DIAGNOSIS — M6281 Muscle weakness (generalized): Secondary | ICD-10-CM

## 2015-06-06 DIAGNOSIS — R600 Localized edema: Secondary | ICD-10-CM

## 2015-06-06 NOTE — Therapy (Signed)
[place], [place], [address] Phone: [phone]   Fax:  [phone]  Physical Therapy Treatment  Patient Details  Name: Chelsey Lewis MRN: [id number] Date of Birth: 1970/12/22 Referring Provider: Sanjuana Kava   Encounter Date: 06/06/2015      PT End of Session - 06/06/15 0904    Visit Number 2   Number of Visits 12   Date for PT Re-Evaluation 06/19/15   Authorization Type BCBS (30 visit limit)   Authorization Time Period 05/29/2015 thru 07/10/2015   PT Start Time 0900   PT Stop Time 0945   PT Time Calculation (min) 45 min   Activity Tolerance Patient tolerated treatment well   Behavior During Therapy [facility] for tasks assessed/performed      Past Medical History  Diagnosis Date  . Vaginal discharge   . Hyperlipidemia   . Overweight(278.02)   . Well adult exam   . Tobacco abuse   . Migraine headache   . Constipation   . Tubal pregnancy   . Anemia   . DUB (dysfunctional uterine bleeding) 05/05/2012  . Peri-menopausal 05/12/2012  . Hot flashes 02/23/2015  . Irregular periods/menstrual cycles 02/23/2015  . Peri-menopause 02/23/2015  . Postmenopausal 03/02/2015    Past Surgical History  Procedure Laterality Date  . Oopherectomy and tubal ligation    . Ectopic pregnancy surgery    . 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 Tubal ligation      There were no vitals filed for this visit.      Subjective Assessment - 06/06/15 0855    Subjective Pt stated ankle is feeling good, has been standing for 12 hours with work with minimal reports of increased swelling.  Reports compliance with HEP with no questions.   Pertinent History pre-diabetic, nicotine dependence   Patient Stated Goals get ankle back to normal    Currently in Pain? No/denies                         [facility] Adult PT Treatment/Exercise - 06/06/15 0001    Exercises   Exercises  Ankle   Ankle Exercises: Stretches   Gastroc Stretch 3 reps;30 seconds   Gastroc Stretch Limitations Long sitting with towel   Ankle Exercises: Standing   Other Standing Ankle Exercises Gait training with cueing for heel to toe and equal stance phase x 226   Ankle Exercises: Seated   ABC's 1 rep   Ankle Circles/Pumps 15 reps;Left  CW and CCW   Towel Inversion/Eversion 4 reps   Towel Inversion/Eversion Weights (lbs) 2   Heel Raises 15 reps   Toe Raise 15 reps   BAPS Level 2;Sitting;10 reps   Other Seated Ankle Exercises Ankle pumps open chain f/b feet on floor heel and toe raises   Ankle Exercises: Supine   Other Supine Ankle Exercises Bridges and SLR all directions 15x BLE   Ankle Exercises: Sidelying   Other Sidelying Ankle Exercises Abduction 15x BLE                  PT Short Term Goals - 05/29/15 1605    PT SHORT TERM GOAL #1   Title Patient will have B ankle range WFL in order to reduce pain and improve mechanics.    Time 3   Period Weeks   Status New   PT SHORT TERM GOAL #2  Title Pt will experience no more than 2/10 pain at worse with a full shift at work in order to improve QOL.    Time 3   Period Weeks   Status New   PT SHORT TERM GOAL #3   Title Pt will demonstrate no edema throughout L ankle in order to demonstrate overal improvement of condition, and will also demonstrate only minimal limittation throughout L ankle joint in order to demonstrate improve mechanics.     Time 3   Period Weeks   Status New   PT SHORT TERM GOAL #4   Title Pt will be independent with HEP, to be updated PRN   Time 1   Period Weeks   Status New           PT Long Term Goals - 05/29/15 1608    PT LONG TERM GOAL #1   Title Pt will demonstrate at least 5/5 in all tested muscle groups to improve stability and dynamic functional performance.    Time 6   Period Weeks   Status New   PT LONG TERM GOAL #2   Title Pt will be able to tolerate a full shift at work with no  edema in L ankle, and pain in L ankle no more than 1/10 in order to improve QOL   Time 6   Period Weeks   Status New   PT LONG TERM GOAL #3   Title Pt will be able to perform moderate level activities for at least 20 min 3x's/wk in order to improve overall health and maintain functional gains.    Time 6   Period Weeks   Status New               Plan - 06/06/15 0914    Clinical Impression Statement Reviewed goals, complaince and assured correct form and technique with HEP and copy of eval given to pt.  Session focus on improving ankle mobiltiy and open chain kinetic exercises for strengthening to improve gait mechanics.  Pt able to demonstrate appropriate technqiues with exercises with min cueing to reduce compensation with knee with BAPS board.  Minimal swelling noted on lateral malleoli region, reviewed RICE techniques to reduce swelling, able to verbalize appropraite time and application for ice application.  No reports of pain with noted improved dorsiflexion range of motion and improved gait mechanics at end of session.     Rehab Potential Excellent   PT Frequency 2x / week   PT Duration 6 weeks   PT Treatment/Interventions ADLs/Self Care Home Management;Cryotherapy;Gait training;Stair training;Therapeutic activities;Therapeutic exercise;Balance training;Neuromuscular re-education;Patient/family education;Manual techniques;Passive range of motion;Energy conservation;Taping   PT Next Visit Plan Focus on ankle mobility and edema, progress to stregthening and closed chain as edema and range improve, balance, gait, and manual PRN.  Next session begin standing heel/toe raises, slant board stretch, rockerboard, progress hip strengthening.        Patient will benefit from skilled therapeutic intervention in order to improve the following deficits and impairments:  Abnormal gait, Hypomobility, Increased edema, Decreased activity tolerance, Decreased strength, Pain, Difficulty walking,  Impaired flexibility, Decreased coordination  Visit Diagnosis: Pain in left ankle and joints of left foot  Localized edema  Stiffness of left ankle, not elsewhere classified  Difficulty in walking, not elsewhere classified  Muscle weakness (generalized)     Problem List Patient Active Problem List   Diagnosis Date Noted  . Postmenopausal 03/02/2015  . Hot flashes 02/23/2015  . Irregular periods/menstrual cycles 02/23/2015  . Peri-menopause  02/23/2015  . Special screening for malignant neoplasms, colon 10/13/2014  . Allergic rhinitis 06/30/2013  . Metabolic syndrome X [id number]  . Nicotine dependence 01/09/2013  . Vitamin D deficiency 11/03/2012  . Peri-menopausal 05/12/2012  . Anemia 05/05/2012  . Insomnia 12/24/2011  . Prediabetes 09/28/2009  . GOITER 04/09/2009  . Dyslipidemia, goal LDL below 100 04/13/2007  . Overweight (BMI 25.0-29.9) 04/13/2007  . ANEMIA-NOS 12/28/2005  . Migraine 12/28/2005   Ihor Austin, [place]; [place]  Aldona Lento 06/06/2015, 9:50 AM  [place], [place], [address] Phone: [phone]   Fax:  [phone]  Name: Chelsey Lewis MRN: [id number] Date of Birth: [birth year]

## 2015-06-07 ENCOUNTER — Ambulatory Visit: Payer: BLUE CROSS/BLUE SHIELD | Admitting: Orthopaedic Surgery

## 2015-06-07 DIAGNOSIS — Z0289 Encounter for other administrative examinations: Secondary | ICD-10-CM

## 2015-06-13 ENCOUNTER — Encounter: Payer: Self-pay | Admitting: Orthopaedic Surgery

## 2015-06-14 ENCOUNTER — Encounter (HOSPITAL_COMMUNITY): Payer: Self-pay | Admitting: Physical Therapy

## 2015-06-14 ENCOUNTER — Telehealth (HOSPITAL_COMMUNITY): Payer: Self-pay | Admitting: Physical Therapy

## 2015-06-14 NOTE — Telephone Encounter (Signed)
Patient a no-show for today's session; called and spoke to patient who apologized and stated that she had forgotten. Reminded patient of time/date of next scheduled session, and she stated she plans to be here.  Deniece Ree PT, DPT [phone]

## 2015-06-15 ENCOUNTER — Ambulatory Visit (HOSPITAL_COMMUNITY): Payer: BLUE CROSS/BLUE SHIELD | Attending: Orthopaedic Surgery

## 2015-06-15 DIAGNOSIS — M25672 Stiffness of left ankle, not elsewhere classified: Secondary | ICD-10-CM | POA: Insufficient documentation

## 2015-06-15 DIAGNOSIS — M6281 Muscle weakness (generalized): Secondary | ICD-10-CM | POA: Diagnosis present

## 2015-06-15 DIAGNOSIS — R262 Difficulty in walking, not elsewhere classified: Secondary | ICD-10-CM | POA: Diagnosis present

## 2015-06-15 DIAGNOSIS — M25572 Pain in left ankle and joints of left foot: Secondary | ICD-10-CM | POA: Insufficient documentation

## 2015-06-15 DIAGNOSIS — R600 Localized edema: Secondary | ICD-10-CM | POA: Insufficient documentation

## 2015-06-15 NOTE — Therapy (Signed)
[place], [place], [address] Phone: [phone]   Fax:  [phone]  Physical Therapy Treatment  Patient Details  Name: Chelsey Lewis MRN: [id number] Date of Birth: [birth year] Referring Provider: Sanjuana Kava   Encounter Date: 06/15/2015      PT End of Session - 06/15/15 1003    Visit Number 3   Number of Visits 12   Date for PT Re-Evaluation 06/19/15   Authorization Type BCBS (30 visit limit)   Authorization Time Period 05/29/2015 thru 07/10/2015   Authorization - Visit Number 3   Authorization - Number of Visits 12   PT Start Time 0950   PT Stop Time 1028   PT Time Calculation (min) 38 min   Activity Tolerance Patient tolerated treatment well   Behavior During Therapy [facility] for tasks assessed/performed      Past Medical History  Diagnosis Date  . Vaginal discharge   . Hyperlipidemia   . Overweight(278.02)   . Well adult exam   . Tobacco abuse   . Migraine headache   . Constipation   . Tubal pregnancy   . Anemia   . DUB (dysfunctional uterine bleeding) 05/05/2012  . Peri-menopausal 05/12/2012  . Hot flashes 02/23/2015  . Irregular periods/menstrual cycles 02/23/2015  . Peri-menopause 02/23/2015  . Postmenopausal 03/02/2015    Past Surgical History  Procedure Laterality Date  . Oopherectomy and tubal ligation    . Ectopic pregnancy surgery    . 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 Tubal ligation      There were no vitals filed for this visit.      Subjective Assessment - 06/15/15 0953    Subjective Pt reoports shes doing ok today. She reports the ankle feels 'tired' due to standing on it for 12 hours at hours. She takes breaks every 2 hours, which helps.    Pertinent History pre-diabetic, nicotine dependence   Currently in Pain? No/denies                         [facility] Adult PT Treatment/Exercise - 06/15/15 0001    Manual  Therapy   Manual Traction left ankle distraction x3 minutes   Ankle Exercises: Stretches   Gastroc Stretch 3 reps;30 seconds  standing, c toein, edge or // bars   Ankle Exercises: Seated   Ankle Circles/Pumps 20 reps  20x CW, 20x CCW   Ankle Exercises: Standing   SLS 6x20s: alternating bilat, in // bars for self-correction.   3x20 in 15* inversion, 3x20 in 15* eversion   Heel Raises 20 reps  2x20, narrow stance, startng in 15* dorsiflexion   Other Standing Ankle Exercises standing toe extension; 15x3sec bilat, narrow stance   Other Standing Ankle Exercises LLE stance hip hiking on step  2x10                  PT Short Term Goals - 05/29/15 1605    PT SHORT TERM GOAL #1   Title Patient will have B ankle range WFL in order to reduce pain and improve mechanics.    Time 3   Period Weeks   Status New   PT SHORT TERM GOAL #2   Title Pt will experience no more than 2/10 pain at worse with a full shift at work in order to improve QOL.    Time 3   Period  Weeks   Status New   PT SHORT TERM GOAL #3   Title Pt will demonstrate no edema throughout L ankle in order to demonstrate overal improvement of condition, and will also demonstrate only minimal limittation throughout L ankle joint in order to demonstrate improve mechanics.     Time 3   Period Weeks   Status New   PT SHORT TERM GOAL #4   Title Pt will be independent with HEP, to be updated PRN   Time 1   Period Weeks   Status New           PT Long Term Goals - 05/29/15 1608    PT LONG TERM GOAL #1   Title Pt will demonstrate at least 5/5 in all tested muscle groups to improve stability and dynamic functional performance.    Time 6   Period Weeks   Status New   PT LONG TERM GOAL #2   Title Pt will be able to tolerate a full shift at work with no edema in L ankle, and pain in L ankle no more than 1/10 in order to improve QOL   Time 6   Period Weeks   Status New   PT LONG TERM GOAL #3   Title Pt will be able to  perform moderate level activities for at least 20 min 3x's/wk in order to improve overall health and maintain functional gains.    Time 6   Period Weeks   Status New               Plan - 06/15/15 1004    Clinical Impression Statement Pt toelrating session well today, as evidenced by progresion of exercises and balance program without and exacerbation of pain. Will continue with POC as outlined in EVAL.    Rehab Potential Excellent   PT Frequency 2x / week   PT Duration 6 weeks   PT Treatment/Interventions ADLs/Self Care Home Management;Cryotherapy;Gait training;Stair training;Therapeutic activities;Therapeutic exercise;Balance training;Neuromuscular re-education;Patient/family education;Manual techniques;Passive range of motion;Energy conservation;Taping   PT Next Visit Plan continue with single leg stance progression, move to unstable surfaces as ready; add in some balance activity to HEP.    PT Home Exercise Plan No updates   Consulted and Agree with Plan of Care Patient      Patient will benefit from skilled therapeutic intervention in order to improve the following deficits and impairments:  Abnormal gait, Hypomobility, Increased edema, Decreased activity tolerance, Decreased strength, Pain, Difficulty walking, Impaired flexibility, Decreased coordination  Visit Diagnosis: Pain in left ankle and joints of left foot  Localized edema  Stiffness of left ankle, not elsewhere classified  Difficulty in walking, not elsewhere classified  Muscle weakness (generalized)     Problem List Patient Active Problem List   Diagnosis Date Noted  . Postmenopausal 03/02/2015  . Hot flashes 02/23/2015  . Irregular periods/menstrual cycles 02/23/2015  . Peri-menopause 02/23/2015  . Special screening for malignant neoplasms, colon 10/13/2014  . Allergic rhinitis 06/30/2013  . Metabolic syndrome X [id number]  . Nicotine dependence 01/09/2013  . Vitamin D deficiency 11/03/2012  .  Peri-menopausal 05/12/2012  . Anemia 05/05/2012  . Insomnia 12/24/2011  . Prediabetes 09/28/2009  . GOITER 04/09/2009  . Dyslipidemia, goal LDL below 100 04/13/2007  . Overweight (BMI 25.0-29.9) 04/13/2007  . ANEMIA-NOS 12/28/2005  . Migraine 12/28/2005    10:32 AM, 06/15/2015 Etta Grandchild, PT, DPT PRN Physical Therapist at [place] # [id number] [phone] (office)    [facility]  [place] [address] [place], [place], [address] Phone: [phone]   Fax:  [phone]  Name: Chelsey Lewis MRN: [id number] Date of Birth: 10-31-1970

## 2015-06-19 ENCOUNTER — Ambulatory Visit (HOSPITAL_COMMUNITY): Payer: BLUE CROSS/BLUE SHIELD | Admitting: Physical Therapy

## 2015-06-19 DIAGNOSIS — M6281 Muscle weakness (generalized): Secondary | ICD-10-CM

## 2015-06-19 DIAGNOSIS — R262 Difficulty in walking, not elsewhere classified: Secondary | ICD-10-CM

## 2015-06-19 DIAGNOSIS — M25572 Pain in left ankle and joints of left foot: Secondary | ICD-10-CM

## 2015-06-19 DIAGNOSIS — M25672 Stiffness of left ankle, not elsewhere classified: Secondary | ICD-10-CM

## 2015-06-19 DIAGNOSIS — R600 Localized edema: Secondary | ICD-10-CM

## 2015-06-19 NOTE — Patient Instructions (Signed)
  ELASTIC BAND INVERSION - SELF FIX  While seated, cross your legs and using an elastic band attached to your foot, hook it under your opposite foot and up to your hand.   Next, draw your foot inward.   Be sure to keep your heel in contact with the floor the entire time.   2x 10 reps 2 x's/day   ELASTIC BAND INVERSION  While seated, use an elastic band attached to your foot and draw your foot inward.   Be sure to keep your heel in contact with the floor the entire time.   2x 10 reps 2 x's/day

## 2015-06-19 NOTE — Therapy (Addendum)
[facility] [address] [place], [place], [address] Phone: [phone]   Fax:  [phone]  Physical Therapy Treatment (Re-assessment)  Patient Details  Name: Chelsey Lewis MRN: [id number] Date of Birth: [birth year] Referring Provider: Sanjuana Kava   Encounter Date: 06/19/2015      PT End of Session - 06/19/15 1050    Visit Number 4   Number of Visits 12   Date for PT Re-Evaluation 07/10/15   Authorization Type BC[employer] (30 visit limit)   Authorization Time Period 05/29/2015 thru 07/10/2015   Authorization - Visit Number 4   Authorization - Number of Visits 12   PT Start Time 0946   PT Stop Time 1032   PT Time Calculation (min) 46 min   Equipment Utilized During Treatment Other (comment)  Green theraband   Activity Tolerance Patient tolerated treatment well   Behavior During Therapy [facility] for tasks assessed/performed      Past Medical History  Diagnosis Date  . Vaginal discharge   . Hyperlipidemia   . Overweight(278.02)   . Well adult exam   . Tobacco abuse   . Migraine headache   . Constipation   . Tubal pregnancy   . Anemia   . DUB (dysfunctional uterine bleeding) 05/05/2012  . Peri-menopausal 05/12/2012  . Hot flashes 02/23/2015  . Irregular periods/menstrual cycles 02/23/2015  . Peri-menopause 02/23/2015  . Postmenopausal 03/02/2015    Past Surgical History  Procedure Laterality Date  . Oopherectomy and tubal ligation    . Ectopic pregnancy surgery    . 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 Tubal ligation      There were no vitals filed for this visit.      Subjective Assessment - 06/19/15 0949    Subjective Pt states that she is still having a little bit of swelling.     How long can you sit comfortably? all day   How long can you stand comfortably? 8 hours   How long can you walk comfortably? 8 hours   Currently in Pain? Yes   Pain Score 4   on average   Pain  Location Ankle   Pain Orientation Left   Pain Descriptors / Indicators Throbbing   Aggravating Factors  balancing on toes   Pain Relieving Factors Ankle circles    Pt expresses that she is limited with her standing tolerance, and walking tolerance, however this is improving.          [facility] PT Assessment - 06/19/15 0001    Observation/Other Assessments   Observations Edema noted along soleus, as well as achilles tendon insertion on the calcaneus.  Continued L lateral edema with mild tenderness.    AROM   Right Ankle Dorsiflexion 10   Right Ankle Inversion 25   Right Ankle Eversion 14   Left Ankle Dorsiflexion 5   Left Ankle Inversion 28   Left Ankle Eversion 11   Strength   Right Hip Flexion 4/5   Right Hip Extension 4+/5   Right Hip ABduction 4/5   Left Hip Flexion 4-/5   Left Hip Extension 4+/5   Left Hip ABduction 4/5   Right Knee Flexion 4+/5   Right Knee Extension 5/5   Left Knee Flexion 4+/5   Left Knee Extension 5/5   Right Ankle Dorsiflexion 5/5   Right Ankle Inversion 4+/5   Right Ankle Eversion 4+/5   Left Ankle Dorsiflexion 5/5  Left Ankle Inversion 4+/5   Left Ankle Eversion 4+/5                     [facility] Adult PT Treatment/Exercise - 06/19/15 0001    Ankle Exercises: Stretches   Soleus Stretch 2 reps;30 seconds  bilaterally   Gastroc Stretch 2 reps;30 seconds  bilaterally - tc's for foot placement & to point toes fwd   Ankle Exercises: Seated   Other Seated Ankle Exercises 2x10 reps inversion/eversion on the left with G TB seated.             Balance Exercises - 06/19/15 1037    Balance Exercises: Standing   SLS Eyes open;2 reps;Time  40 sec on the right, and 1 min on the left           PT Education - 06/19/15 1048    Education provided Yes   Education Details Pt educated on prognosis with AROM.   Person(s) Educated Patient   Methods Explanation;Demonstration;Tactile cues;Handout   Comprehension Verbalized  understanding;Need further instruction;Returned demonstration          PT Short Term Goals - 06/19/15 1057    PT SHORT TERM GOAL #1   Title Patient will have B ankle range WFL in order to reduce pain and improve mechanics.    Time 3   Period Weeks   Status On-going   PT SHORT TERM GOAL #2   Title Pt will experience no more than 2/10 pain at worse with a full shift at work in order to improve QOL.    Baseline 6/6 - Average is 4/10   Time 3   Period Weeks   Status On-going   PT SHORT TERM GOAL #3   Title Pt will demonstrate no edema throughout L ankle in order to demonstrate overal improvement of condition, and will also demonstrate only minimal limittation throughout L ankle joint in order to demonstrate improve mechanics.     Baseline 6/6 - Continues to have edema noted over L lateral malleolus, as well as posterior soleus/achilles tendon   Period Weeks   Status On-going   PT SHORT TERM GOAL #4   Title Pt will be independent with HEP, to be updated PRN   Time 1   Period Weeks   Status Achieved           PT Long Term Goals - 06/19/15 1100    PT LONG TERM GOAL #1   Title Pt will demonstrate at least 5/5 in all tested muscle groups to improve stability and dynamic functional performance.    Time 6   Period Weeks   Status Partially Met   PT LONG TERM GOAL #2   Title Pt will be able to tolerate a full shift at work with no edema in L ankle, and pain in L ankle no more than 1/10 in order to improve QOL   Baseline 6/6 - pt states she works 8 hr shifts, but still with edema noted   Time 6   Period Weeks   Status On-going   PT LONG TERM GOAL #3   Title Pt will be able to perform moderate level activities for at least 20 min 3x's/wk in order to improve overall health and maintain functional gains.    Baseline 6/6 - pt stated she had to chase after a buggy at work, however this caused increased pain   Time 6   Period Weeks   Status On-going  Plan -  06/19/15 1051    Clinical Impression Statement Pt states that she has been doing well with her HEP.  She demonstrated improvement in LE strength, and expressed increased standing and walking tolerance.  Pt continues to demonstrate deficits with AROM, edema, as well as pain, therefore, she would benefit from continued skilled PT to optimize her strength, balance, and decrease pain to improve QOL and return to leisure activities.     Rehab Potential Excellent   PT Frequency 2x / week   PT Duration 6 weeks   PT Treatment/Interventions ADLs/Self Care Home Management;Cryotherapy;Gait training;Stair training;Therapeutic activities;Therapeutic exercise;Balance training;Neuromuscular re-education;Patient/family education;Manual techniques;Passive range of motion;Energy conservation;Taping   PT Next Visit Plan Continue to progress with SLS on unstable surfaces, continue to progress joint AROM with some ankle joint mobilization to increase L dorsiflexion.  Continue with edema reducing techniques including taping or manual.    PT Home Exercise Plan Added seated inversion and eversion with green TB.  Pt given green TB.    Consulted and Agree with Plan of Care Patient      Patient will benefit from skilled therapeutic intervention in order to improve the following deficits and impairments:  Abnormal gait, Hypomobility, Increased edema, Decreased activity tolerance, Decreased strength, Pain, Difficulty walking, Impaired flexibility, Decreased coordination  Visit Diagnosis: Pain in left ankle and joints of left foot  Localized edema  Stiffness of left ankle, not elsewhere classified  Difficulty in walking, not elsewhere classified  Muscle weakness (generalized)     Problem List Patient Active Problem List   Diagnosis Date Noted  . Postmenopausal 03/02/2015  . Hot flashes 02/23/2015  . Irregular periods/menstrual cycles 02/23/2015  . Peri-menopause 02/23/2015  . Special screening for malignant  neoplasms, colon 10/13/2014  . Allergic rhinitis 06/30/2013  . Metabolic syndrome X 03/40/3524  . Nicotine dependence 01/09/2013  . Vitamin D deficiency 11/03/2012  . Peri-menopausal 05/12/2012  . Anemia 05/05/2012  . Insomnia 12/24/2011  . Prediabetes 09/28/2009  . GOITER 04/09/2009  . Dyslipidemia, goal LDL below 100 04/13/2007  . Overweight (BMI 25.0-29.9) 04/13/2007  . ANEMIA-NOS 12/28/2005  . Migraine 12/28/2005    Beth Neldon Shepard, PT, DPT X: [phone]   [facility] [address] [place], [place], [address] Phone: [phone]   Fax:  [phone]  Name: Chelsey Lewis MRN: [id number] Date of Birth: [birth year]

## 2015-06-21 ENCOUNTER — Ambulatory Visit (HOSPITAL_COMMUNITY): Payer: BLUE CROSS/BLUE SHIELD | Admitting: Physical Therapy

## 2015-06-21 DIAGNOSIS — M6281 Muscle weakness (generalized): Secondary | ICD-10-CM

## 2015-06-21 DIAGNOSIS — R262 Difficulty in walking, not elsewhere classified: Secondary | ICD-10-CM

## 2015-06-21 DIAGNOSIS — M25672 Stiffness of left ankle, not elsewhere classified: Secondary | ICD-10-CM

## 2015-06-21 DIAGNOSIS — R600 Localized edema: Secondary | ICD-10-CM

## 2015-06-21 DIAGNOSIS — M25572 Pain in left ankle and joints of left foot: Secondary | ICD-10-CM | POA: Diagnosis not present

## 2015-06-21 NOTE — Therapy (Signed)
[facility] [address] [place], [place], [address] Phone: [phone]   Fax:  [phone]  Physical Therapy Treatment  Patient Details  Name: Chelsey Lewis MRN: [id number] Date of Birth: 03-31-1970 Referring Provider: Sanjuana Kava   Encounter Date: 06/21/2015      PT End of Session - 06/21/15 1223    Visit Number 5   Number of Visits 12   Date for PT Re-Evaluation 06/19/15   Authorization Type BC[employer] (30 visit limit)   Authorization Time Period 05/29/2015 thru 07/10/2015   Authorization - Visit Number 5   Authorization - Number of Visits 12   PT Start Time [phone]   PT Stop Time 1032   PT Time Calculation (min) 40 min   Equipment Utilized During Treatment Other (comment)  Green theraband   Activity Tolerance Patient tolerated treatment well   Behavior During Therapy [facility] for tasks assessed/performed      Past Medical History  Diagnosis Date  . Vaginal discharge   . Hyperlipidemia   . Overweight(278.02)   . Well adult exam   . Tobacco abuse   . Migraine headache   . Constipation   . Tubal pregnancy   . Anemia   . DUB (dysfunctional uterine bleeding) 05/05/2012  . Peri-menopausal 05/12/2012  . Hot flashes 02/23/2015  . Irregular periods/menstrual cycles 02/23/2015  . Peri-menopause 02/23/2015  . Postmenopausal 03/02/2015    Past Surgical History  Procedure Laterality Date  . Oopherectomy and tubal ligation    . Ectopic pregnancy surgery    . 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 Tubal ligation      There were no vitals filed for this visit.      Subjective Assessment - 06/21/15 1015    Subjective Pt states she is doing well overall, just a little swelling and soreness occastionally   Currently in Pain? No/denies                         [facility] Adult PT Treatment/Exercise - 06/21/15 0001    Ankle Exercises: Stretches   Plantar Fascia Stretch 2 reps;30  seconds   Soleus Stretch 2 reps;30 seconds   Gastroc Stretch 2 reps;30 seconds   Ankle Exercises: Standing   BAPS Standing;Level 2;10 reps   Rocker Board 1 minute   Heel Raises 20 reps   Toe Raise 20 reps             Balance Exercises - 06/21/15 1006    Balance Exercises: Standing   Tandem Stance Eyes open;1 rep;30 secs;Foam/compliant surface   SLS Eyes open;2 reps;Time  Rt:22". Lt: 29"   SLS with Vectors Upper extremity assist 1;Solid surface  10X3" each   Rockerboard Anterior/posterior;Lateral;10 reps;Intermittent UE support  and holds in the middle 1' each             PT Short Term Goals - 06/19/15 1057    PT SHORT TERM GOAL #1   Title Patient will have B ankle range WFL in order to reduce pain and improve mechanics.    Time 3   Period Weeks   Status On-going   PT SHORT TERM GOAL #2   Title Pt will experience no more than 2/10 pain at worse with a full shift at work in order to improve QOL.    Baseline 6/6 - Average is 4/10   Time 3   Period  Weeks   Status On-going   PT SHORT TERM GOAL #3   Title Pt will demonstrate no edema throughout L ankle in order to demonstrate overal improvement of condition, and will also demonstrate only minimal limittation throughout L ankle joint in order to demonstrate improve mechanics.     Baseline 6/6 - Continues to have edema noted over L lateral malleolus, as well as posterior soleus/achilles tendon   Period Weeks   Status On-going   PT SHORT TERM GOAL #4   Title Pt will be independent with HEP, to be updated PRN   Time 1   Period Weeks   Status Achieved           PT Long Term Goals - 06/19/15 1100    PT LONG TERM GOAL #1   Title Pt will demonstrate at least 5/5 in all tested muscle groups to improve stability and dynamic functional performance.    Time 6   Period Weeks   Status Partially Met   PT LONG TERM GOAL #2   Title Pt will be able to tolerate a full shift at work with no edema in L ankle, and pain in L  ankle no more than 1/10 in order to improve QOL   Baseline 6/6 - pt states she works 8 hr shifts, but still with edema noted   Time 6   Period Weeks   Status On-going   PT LONG TERM GOAL #3   Title Pt will be able to perform moderate level activities for at least 20 min 3x's/wk in order to improve overall health and maintain functional gains.    Baseline 6/6 - pt stated she had to chase after a buggy at work, however this caused increased pain   Time 6   Period Weeks   Status On-going               Plan - 06/21/15 1224    Clinical Impression Statement Progressed this session with standing stability and coordination exercises for Lt ankle.  pt noted to have instability in bilateral ankles as noted with longer SLS on Lt as compared to Rt LE.  Most difficulty completing eversion tasks with BAPS.  Instructed with various gastroc/soleus stretches as also with tightness there.  Recommended patient come to thereapy wearing tennis shoes to be able to progress to higher level balance tasks safely.     Rehab Potential Excellent   PT Frequency 2x / week   PT Duration 6 weeks   PT Treatment/Interventions ADLs/Self Care Home Management;Cryotherapy;Gait training;Stair training;Therapeutic activities;Therapeutic exercise;Balance training;Neuromuscular re-education;Patient/family education;Manual techniques;Passive range of motion;Energy conservation;Taping   PT Next Visit Plan Continue to progress wtih increaseing dynamic balance actvities.  Manual as needed.    PT Home Exercise Plan Added seated inversion and eversion with green TB.  Pt given green TB.    Consulted and Agree with Plan of Care Patient      Patient will benefit from skilled therapeutic intervention in order to improve the following deficits and impairments:  Abnormal gait, Hypomobility, Increased edema, Decreased activity tolerance, Decreased strength, Pain, Difficulty walking, Impaired flexibility, Decreased coordination  Visit  Diagnosis: Pain in left ankle and joints of left foot  Localized edema  Stiffness of left ankle, not elsewhere classified  Difficulty in walking, not elsewhere classified  Muscle weakness (generalized)     Problem List Patient Active Problem List   Diagnosis Date Noted  . Postmenopausal 03/02/2015  . Hot flashes 02/23/2015  . Irregular periods/menstrual cycles 02/23/2015  .  Peri-menopause 02/23/2015  . Special screening for malignant neoplasms, colon 10/13/2014  . Allergic rhinitis 06/30/2013  . Metabolic syndrome X 29/79/8921  . Nicotine dependence 01/09/2013  . Vitamin D deficiency 11/03/2012  . Peri-menopausal 05/12/2012  . Anemia 05/05/2012  . Insomnia 12/24/2011  . Prediabetes 09/28/2009  . GOITER 04/09/2009  . Dyslipidemia, goal LDL below 100 04/13/2007  . Overweight (BMI 25.0-29.9) 04/13/2007  . ANEMIA-NOS 12/28/2005  . Migraine 12/28/2005    Teena Irani, PTA/CLT [phone]  06/21/2015, 12:31 PM  [facility] [address] [place], [place], [address] Phone: [phone]   Fax:  [phone]  Name: Chelsey Lewis MRN: [id number] Date of Birth: 1970/02/25

## 2015-06-21 NOTE — Patient Instructions (Addendum)
Achilles / Soleus, Standing    Stand, right foot behind, heel on floor and turned slightly out. Lower hips and bend knees. Hold _30__ seconds. Do __2_ sessions per day.  Plantar Fascia Stretch    Standing with only ball of left foot on stair, push heel down until stretch is felt through arch of foot. Hold _30___ seconds. Do __2__ sessions per day.

## 2015-06-26 ENCOUNTER — Ambulatory Visit (HOSPITAL_COMMUNITY): Payer: BLUE CROSS/BLUE SHIELD | Admitting: Physical Therapy

## 2015-06-26 DIAGNOSIS — M6281 Muscle weakness (generalized): Secondary | ICD-10-CM

## 2015-06-26 DIAGNOSIS — M25572 Pain in left ankle and joints of left foot: Secondary | ICD-10-CM

## 2015-06-26 DIAGNOSIS — R600 Localized edema: Secondary | ICD-10-CM

## 2015-06-26 DIAGNOSIS — R262 Difficulty in walking, not elsewhere classified: Secondary | ICD-10-CM

## 2015-06-26 DIAGNOSIS — M25672 Stiffness of left ankle, not elsewhere classified: Secondary | ICD-10-CM

## 2015-06-26 NOTE — Therapy (Signed)
[facility] [address] [place], [place], [address] Phone: [phone]   Fax:  [phone]  Physical Therapy Treatment  Patient Details  Name: Chelsey Lewis MRN: [id number] Date of Birth: [birth year] Referring Provider: Sanjuana Kava   Encounter Date: 06/26/2015      PT End of Session - 06/26/15 1030    Visit Number 6   Number of Visits 12   Date for PT Re-Evaluation 07/10/15   Authorization Time Period 05/29/2015 thru 07/10/2015   PT Start Time 0947   PT Stop Time 1027   PT Time Calculation (min) 40 min   Activity Tolerance Patient tolerated treatment well   Behavior During Therapy [facility] for tasks assessed/performed      Past Medical History  Diagnosis Date  . Vaginal discharge   . Hyperlipidemia   . Overweight(278.02)   . Well adult exam   . Tobacco abuse   . Migraine headache   . Constipation   . Tubal pregnancy   . Anemia   . DUB (dysfunctional uterine bleeding) 05/05/2012  . Peri-menopausal 05/12/2012  . Hot flashes 02/23/2015  . Irregular periods/menstrual cycles 02/23/2015  . Peri-menopause 02/23/2015  . Postmenopausal 03/02/2015    Past Surgical History  Procedure Laterality Date  . Oopherectomy and tubal ligation    . Ectopic pregnancy surgery    . 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 Tubal ligation      There were no vitals filed for this visit.      Subjective Assessment - 06/26/15 0949    Subjective Patient arrives today stating she is doing well overall; no pain, just a bit stiff this morning. Nothing major going on otherwise.    Pertinent History pre-diabetic, nicotine dependence   Currently in Pain? No/denies                         [facility] Adult PT Treatment/Exercise - 06/26/15 0001    Ankle Exercises: Stretches   Plantar Fascia Stretch 3 reps;30 seconds   Gastroc Stretch 3 reps;30 seconds   Other Stretch hamstring stretch 2x30 seconds  12 inch box    Ankle Exercises: Seated   ABC's 2 reps   Ankle Circles/Pumps 20 reps clockwise and counter clockwise    Ankle Exercises: Standing   BAPS Standing;Level 2;15 reps   SLS 3x each side 55 second best, about 15-20 second average    Rocker Board 2 minutes  AP and lateral no HHA min guard    Heel Raises 20 reps  toe raises x20   Toe Raise 20 reps   Heel Walk (Round Trip) 6f x2 round trip    Toe Walk (Round Trip) 121fx2 round trip    Other Standing Ankle Exercises lateral weight shifts onto air pads with intermittent UE assist x2 minutes                 PT Education - 06/26/15 1030    Education provided Yes   Education Details ice after session today due to new activities    Person(s) Educated Patient   Methods Explanation   Comprehension Verbalized understanding          PT Short Term Goals - 06/19/15 1057    PT SHORT TERM GOAL #1   Title Patient will have B ankle range WFL in order to reduce pain and improve mechanics.    Time  3   Period Weeks   Status On-going   PT SHORT TERM GOAL #2   Title Pt will experience no more than 2/10 pain at worse with a full shift at work in order to improve QOL.    Baseline 6/6 - Average is 4/10   Time 3   Period Weeks   Status On-going   PT SHORT TERM GOAL #3   Title Pt will demonstrate no edema throughout L ankle in order to demonstrate overal improvement of condition, and will also demonstrate only minimal limittation throughout L ankle joint in order to demonstrate improve mechanics.     Baseline 6/6 - Continues to have edema noted over L lateral malleolus, as well as posterior soleus/achilles tendon   Period Weeks   Status On-going   PT SHORT TERM GOAL #4   Title Pt will be independent with HEP, to be updated PRN   Time 1   Period Weeks   Status Achieved           PT Long Term Goals - 06/19/15 1100    PT LONG TERM GOAL #1   Title Pt will demonstrate at least 5/5 in all tested muscle groups to improve  stability and dynamic functional performance.    Time 6   Period Weeks   Status Partially Met   PT LONG TERM GOAL #2   Title Pt will be able to tolerate a full shift at work with no edema in L ankle, and pain in L ankle no more than 1/10 in order to improve QOL   Baseline 6/6 - pt states she works 8 hr shifts, but still with edema noted   Time 6   Period Weeks   Status On-going   PT LONG TERM GOAL #3   Title Pt will be able to perform moderate level activities for at least 20 min 3x's/wk in order to improve overall health and maintain functional gains.    Baseline 6/6 - pt stated she had to chase after a buggy at work, however this caused increased pain   Time 6   Period Weeks   Status On-going               Plan - 06/26/15 1031    Clinical Impression Statement Continued focus on general ankle ROM as well as exercises to strengthen and stabilize ankle today; introduced heel/toe walking, more extensive work on [employer] operational [employer], and lateral weight shifts onto air pads today with good tolerance by patient however she did require occasoinal cues for form. Patient continues to experience ankle stiffness and does continue to have difficulty with BAPS board due to said stiffness at this time. Expressed and demonstrate muscle fatigue at end of session, and was advised to ice as a precaution after new activities.    Rehab Potential Excellent   PT Frequency 2x / week   PT Duration 6 weeks   PT Treatment/Interventions ADLs/Self Care Home Management;Cryotherapy;Gait training;Stair training;Therapeutic activities;Therapeutic exercise;Balance training;Neuromuscular re-education;Patient/family education;Manual techniques;Passive range of motion;Energy conservation;Taping   PT Next Visit Plan Continue to progress wtih increaseing dynamic balance actvities.  Manual as needed.    Consulted and Agree with Plan of Care Patient      Patient will benefit from skilled therapeutic intervention in order to  improve the following deficits and impairments:  Abnormal gait, Hypomobility, Increased edema, Decreased activity tolerance, Decreased strength, Pain, Difficulty walking, Impaired flexibility, Decreased coordination  Visit Diagnosis: Pain in left ankle and joints of left foot  Localized  edema  Stiffness of left ankle, not elsewhere classified  Difficulty in walking, not elsewhere classified  Muscle weakness (generalized)     Problem List Patient Active Problem List   Diagnosis Date Noted  . Postmenopausal 03/02/2015  . Hot flashes 02/23/2015  . Irregular periods/menstrual cycles 02/23/2015  . Peri-menopause 02/23/2015  . Special screening for malignant neoplasms, colon 10/13/2014  . Allergic rhinitis 06/30/2013  . Metabolic syndrome X 85/88/5027  . Nicotine dependence 01/09/2013  . Vitamin D deficiency 11/03/2012  . Peri-menopausal 05/12/2012  . Anemia 05/05/2012  . Insomnia 12/24/2011  . Prediabetes 09/28/2009  . GOITER 04/09/2009  . Dyslipidemia, goal LDL below 100 04/13/2007  . Overweight (BMI 25.0-29.9) 04/13/2007  . ANEMIA-NOS 12/28/2005  . Migraine 12/28/2005    Deniece Ree PT, DPT [facility] [address] [place], [place], [address] Phone: [phone]   Fax:  [phone]  Name: Chelsey Lewis MRN: [id number] Date of Birth: 1971-01-04

## 2015-06-28 ENCOUNTER — Ambulatory Visit (HOSPITAL_COMMUNITY): Payer: BLUE CROSS/BLUE SHIELD | Admitting: Physical Therapy

## 2015-06-28 ENCOUNTER — Telehealth (HOSPITAL_COMMUNITY): Payer: Self-pay | Admitting: Physical Therapy

## 2015-06-28 NOTE — Telephone Encounter (Signed)
She has a headache and can not come in

## 2015-07-03 ENCOUNTER — Telehealth (HOSPITAL_COMMUNITY): Payer: Self-pay

## 2015-07-03 ENCOUNTER — Encounter (HOSPITAL_COMMUNITY): Payer: Self-pay

## 2015-07-03 NOTE — Telephone Encounter (Signed)
No show, called and left message regarding missed apt today.  reminded next apt date and time with contact info given.  [address] [place]; [facility] [phone]

## 2015-07-05 ENCOUNTER — Ambulatory Visit (HOSPITAL_COMMUNITY): Payer: BLUE CROSS/BLUE SHIELD

## 2015-07-10 ENCOUNTER — Ambulatory Visit (HOSPITAL_COMMUNITY): Payer: BLUE CROSS/BLUE SHIELD | Admitting: Physical Therapy

## 2015-07-10 ENCOUNTER — Telehealth (HOSPITAL_COMMUNITY): Payer: Self-pay | Admitting: Physical Therapy

## 2015-07-10 ENCOUNTER — Encounter (HOSPITAL_COMMUNITY): Payer: Self-pay | Admitting: Physical Therapy

## 2015-07-10 NOTE — Telephone Encounter (Signed)
Patient a no-show for her appt today, making it her third consecutive no-show. Called and left message that she is being DC-ed per clinic policy and will need new MD order to return to [place], DPT [phone]

## 2015-07-10 NOTE — Therapy (Signed)
[place], [place], [address] Phone: [phone]   Fax:  [phone]  Patient Details  Name: Chelsey Lewis MRN: [id number] Date of Birth: 07-Jul-1970 Referring Provider:  No ref. provider found  Encounter Date: 07/10/2015  PHYSICAL THERAPY DISCHARGE SUMMARY  Visits from Start of Care: 6  Current functional level related to goals / functional outcomes: Patient has had 3 consecutive no-shows and is being DC-ed per clinic policy. She will need new MD order to return to skilled PT services.    Remaining deficits: Unable to assess    Education / Equipment: Called and left message regarding DC today due to clinic policy.  Plan: Patient agrees to discharge.  Patient goals were partially met. Patient is being discharged due to not returning since the last visit.  ?????       Deniece Ree PT, DPT [place] [address] [place], [place], [address] Phone: [phone]   Fax:  [phone]

## 2015-07-12 ENCOUNTER — Ambulatory Visit: Payer: Self-pay | Admitting: Family Medicine

## 2015-07-12 ENCOUNTER — Encounter (HOSPITAL_COMMUNITY): Payer: Self-pay | Admitting: Physical Therapy

## 2015-07-18 ENCOUNTER — Ambulatory Visit: Payer: Self-pay | Admitting: Family Medicine

## 2015-07-18 ENCOUNTER — Encounter: Payer: Self-pay | Admitting: Family Medicine

## 2015-07-19 ENCOUNTER — Encounter: Payer: Self-pay | Admitting: Family Medicine

## 2015-07-19 ENCOUNTER — Ambulatory Visit (INDEPENDENT_AMBULATORY_CARE_PROVIDER_SITE_OTHER): Payer: BLUE CROSS/BLUE SHIELD | Admitting: Family Medicine

## 2015-07-19 VITALS — BP 126/74 | HR 82 | Resp 18 | Ht 65.0 in | Wt 190.1 lb

## 2015-07-19 DIAGNOSIS — J3089 Other allergic rhinitis: Secondary | ICD-10-CM

## 2015-07-19 DIAGNOSIS — R7303 Prediabetes: Secondary | ICD-10-CM

## 2015-07-19 DIAGNOSIS — T753XXA Motion sickness, initial encounter: Secondary | ICD-10-CM

## 2015-07-19 DIAGNOSIS — F40243 Fear of flying: Secondary | ICD-10-CM | POA: Diagnosis not present

## 2015-07-19 DIAGNOSIS — G43809 Other migraine, not intractable, without status migrainosus: Secondary | ICD-10-CM | POA: Diagnosis not present

## 2015-07-19 DIAGNOSIS — E669 Obesity, unspecified: Secondary | ICD-10-CM

## 2015-07-19 MED ORDER — BUTALBITAL-APAP-CAFFEINE 50-325-40 MG PO TABS: ORAL_TABLET | ORAL | Status: AC

## 2015-07-19 MED ORDER — SCOPOLAMINE 1 MG/3DAYS TD PT72: 1.0000 | MEDICATED_PATCH | TRANSDERMAL | Status: DC

## 2015-07-19 MED ORDER — ALPRAZOLAM 0.25 MG PO TABS: ORAL_TABLET | ORAL | Status: DC

## 2015-07-19 NOTE — Assessment & Plan Note (Addendum)
On avg 2 per month, light aggravates the headache , good response to medication, continue same

## 2015-07-19 NOTE — Patient Instructions (Signed)
F/u in 5 month, call if you need me sooner  Medication sent for headaches, motion sickness and anxiety  Fasting labs as soon as possible  CONGRATS ON STOPPING SMOKING Please work on good  health habits so that your health will improve. 1. Commitment to daily physical activity for 30 to 60  minutes, if you are able to do this.  2. Commitment to wise food choices. Aim for half of your  food intake to be vegetable and fruit, one quarter starchy foods, and one quarter protein. Try to eat on a regular schedule  3 meals per day, snacking between meals should be limited to vegetables or fruits or small portions of nuts. 64 ounces of water per day is generally recommended, unless you have specific health conditions, like heart failure or kidney failure where you will need to limit fluid intake.  3. Commitment to sufficient and a  good quality of physical and mental rest daily, generally between 6 to 8 hours per day.  WITH PERSISTANCE AND PERSEVERANCE, THE IMPOSSIBLE , BECOMES THE NORM! Thank you  for choosing [facility]. We consider it a privelige to serve you.  Delivering excellent health care in a caring and  compassionate way is our goal.  Partnering with you,  so that together we can achieve this goal is our strategy.

## 2015-07-21 DIAGNOSIS — T753XXA Motion sickness, initial encounter: Secondary | ICD-10-CM | POA: Insufficient documentation

## 2015-07-21 NOTE — Assessment & Plan Note (Signed)
Deteriorated. Patient re-educated about  the importance of commitment to a  minimum of 150 minutes of exercise per week.  The importance of healthy food choices with portion control discussed. Encouraged to start a food diary, count calories and to consider  joining a support group. Sample diet sheets offered. Goals set by the patient for the next several months.   Weight /BMI 07/19/2015 05/16/2015 03/02/2015  WEIGHT 190 lb 1.9 oz 192 lb 186 lb  HEIGHT 5\' 5"  5\' 6"  5\' 6"   BMI 31.64 kg/m2 31 kg/m2 30.04 kg/m2

## 2015-07-21 NOTE — Assessment & Plan Note (Signed)
Patient educated about the importance of limiting  Carbohydrate intake , the need to commit to daily physical activity for a minimum of 30 minutes , and to commit weight loss. The fact that changes in all these areas will reduce or eliminate all together the development of diabetes is stressed.   Updated lab needed at/ before next visit.  Diabetic Labs Latest Ref Rng 04/05/2014 04/18/2013 09/02/2012 05/05/2012 12/23/2011  HbA1c <5.7 % - 5.6 - - 5.7(H)  Chol 0 - 200 mg/dL 197 170 - 107 177  HDL >=46 mg/dL 56 39(L) - 33(L) 43  Calc LDL 0 - 99 mg/dL 130(H) 115(H) - 60 123(H)  Triglycerides <150 mg/dL 55 82 - 72 53  Creatinine 0.50 - 1.10 mg/dL 0.90 1.12(H) 0.97 1.04 1.06   BP/Weight 07/19/2015 05/16/2015 03/02/2015 02/23/2015 10/12/2014 04/05/2014 [id number]  Systolic BP 123XX123 0000000 [id number]  Diastolic BP 74 84 92 90 82 82 86  Wt. (Lbs) 190.12 192 186 184 173 170 173.4  BMI 31.64 31 30.04 29.71 28.79 28.29 28.86   No flowsheet data found.

## 2015-07-21 NOTE — Assessment & Plan Note (Signed)
No current flare, no medication being taken at this time

## 2015-07-21 NOTE — Assessment & Plan Note (Signed)
Motion sickness anticipated during upcoming cruise, medication prescribed for prophylactic use

## 2015-07-21 NOTE — Assessment & Plan Note (Signed)
Behavior modification and relative safety of flying versus driving in a motor vehicle discussed. Limited supply of xanax prescribed also, in the even they are needed

## 2015-07-21 NOTE — Progress Notes (Signed)
Chelsey Lewis     MRN: [id number]      DOB: 05-07-1970   HPI Chelsey Lewis is here for follow up and re-evaluation of chronic medical conditions, medication management and review of any available recent lab and radiology data.  Preventive health is updated, specifically  Cancer screening and Immunization.   Questions or concerns regarding consultations or procedures which the PT has had in the interim are  addressed. Has upcoming travel on a [plane as well as a ship for the first time, she is anxious, and wants medication to take  Recently had injury of her leg and had to be out of work for several weeks, has gained weight due to lack of exercise and poor eating,plans to reverse this  ROS Denies recent fever or chills. Denies sinus pressure, nasal congestion, ear pain or sore throat. Denies chest congestion, productive cough or wheezing. Denies chest pains, palpitations and leg swelling Denies abdominal pain, nausea, vomiting,diarrhea or constipation.   Denies dysuria, frequency, hesitancy or incontinence. Denies joint pain, swelling and limitation in mobility. Denies headaches, seizures, numbness, or tingling. Denies depression, anxiety or insomnia. Denies skin break down or rash.   PE  BP 126/74 mmHg  Pulse 82  Resp 18  Ht 5\' 5"  (1.651 m)  Wt 190 lb 1.9 oz (86.238 kg)  BMI 31.64 kg/m2  SpO2 99%  Patient alert and oriented and in no cardiopulmonary distress.  HEENT: No facial asymmetry, EOMI,   oropharynx pink and moist.  Neck supple no JVD, no mass.  Chest: Clear to auscultation bilaterally.  CVS: S1, S2 no murmurs, no S3.Regular rate.  ABD: Soft non tender.   Ext: No edema  MS: Adequate ROM spine, shoulders, hips and knees.  Skin: Intact, no ulcerations or rash noted.  Psych: Good eye contact, normal affect. Memory intact not anxious or depressed appearing.  CNS: CN 2-12 intact, power,  normal throughout.no focal deficits noted.   Assessment & Plan  Migraine On  avg 2 per month, light aggravates the headache , good response to medication, continue same  Fear of flying Behavior modification and relative safety of flying versus driving in a motor vehicle discussed. Limited supply of xanax prescribed also, in the even they are needed  Allergic rhinitis No current flare, no medication being taken at this time  Prediabetes Patient educated about the importance of limiting  Carbohydrate intake , the need to commit to daily physical activity for a minimum of 30 minutes , and to commit weight loss. The fact that changes in all these areas will reduce or eliminate all together the development of diabetes is stressed.   Updated lab needed at/ before next visit.  Diabetic Labs Latest Ref Rng 04/05/2014 04/18/2013 09/02/2012 05/05/2012 12/23/2011  HbA1c <5.7 % - 5.6 - - 5.7(H)  Chol 0 - 200 mg/dL 197 170 - 107 177  HDL >=46 mg/dL 56 39(L) - 33(L) 43  Calc LDL 0 - 99 mg/dL 130(H) 115(H) - 60 123(H)  Triglycerides <150 mg/dL 55 82 - 72 53  Creatinine 0.50 - 1.10 mg/dL 0.90 1.12(H) 0.97 1.04 1.06   BP/Weight 07/19/2015 05/16/2015 03/02/2015 02/23/2015 10/12/2014 04/05/2014 [id number]  Systolic BP 123XX123 0000000 [id number]  Diastolic BP 74 84 92 90 82 82 86  Wt. (Lbs) 190.12 192 186 184 173 170 173.4  BMI 31.64 31 30.04 29.71 28.79 28.29 28.86   No flowsheet data found.     Obesity (BMI 30.0-34.9) Deteriorated. Patient re-educated about  the importance of commitment to a  minimum of 150 minutes of exercise per week.  The importance of healthy food choices with portion control discussed. Encouraged to start a food diary, count calories and to consider  joining a support group. Sample diet sheets offered. Goals set by the patient for the next several months.   Weight /BMI 07/19/2015 05/16/2015 03/02/2015  WEIGHT 190 lb 1.9 oz 192 lb 186 lb  HEIGHT 5\' 5"  5\' 6"  5\' 6"   BMI 31.64 kg/m2 31 kg/m2 30.04 kg/m2       Motion sickness Motion sickness anticipated  during upcoming cruise, medication prescribed for prophylactic use

## 2015-07-23 ENCOUNTER — Ambulatory Visit: Payer: BLUE CROSS/BLUE SHIELD | Admitting: Adult Health

## 2015-08-06 ENCOUNTER — Ambulatory Visit: Payer: BLUE CROSS/BLUE SHIELD | Admitting: Adult Health

## 2015-08-14 ENCOUNTER — Ambulatory Visit: Payer: BLUE CROSS/BLUE SHIELD | Admitting: Adult Health

## 2015-12-19 ENCOUNTER — Ambulatory Visit: Payer: Self-pay | Admitting: Family Medicine

## 2016-01-23 ENCOUNTER — Ambulatory Visit (INDEPENDENT_AMBULATORY_CARE_PROVIDER_SITE_OTHER): Payer: BLUE CROSS/BLUE SHIELD | Admitting: Family Medicine

## 2016-01-23 ENCOUNTER — Encounter: Payer: Self-pay | Admitting: Family Medicine

## 2016-01-23 VITALS — BP 128/80 | HR 64 | Temp 99.0°F | Resp 16 | Ht 65.0 in | Wt 179.0 lb

## 2016-01-23 DIAGNOSIS — D649 Anemia, unspecified: Secondary | ICD-10-CM

## 2016-01-23 DIAGNOSIS — F5101 Primary insomnia: Secondary | ICD-10-CM | POA: Diagnosis not present

## 2016-01-23 DIAGNOSIS — E559 Vitamin D deficiency, unspecified: Secondary | ICD-10-CM

## 2016-01-23 DIAGNOSIS — F17210 Nicotine dependence, cigarettes, uncomplicated: Secondary | ICD-10-CM

## 2016-01-23 DIAGNOSIS — E663 Overweight: Secondary | ICD-10-CM

## 2016-01-23 DIAGNOSIS — E785 Hyperlipidemia, unspecified: Secondary | ICD-10-CM

## 2016-01-23 DIAGNOSIS — R7303 Prediabetes: Secondary | ICD-10-CM

## 2016-01-23 MED ORDER — ERGOCALCIFEROL 1.25 MG (50000 UT) PO CAPS: 50000.0000 [IU] | ORAL_CAPSULE | ORAL | 1 refills | Status: DC

## 2016-01-23 MED ORDER — TEMAZEPAM 30 MG PO CAPS: 30.0000 mg | ORAL_CAPSULE | Freq: Every evening | ORAL | 3 refills | Status: DC | PRN

## 2016-01-23 NOTE — Patient Instructions (Addendum)
Anual exam in 5.5 month, call if you need me before  Fasting CBC, lipid, cmp , TSH, Vit D in 5 month  Pls sched mammogram due end of this month  STOP ALL sMOKING    It is important that you exercise regularly at least 30 minutes 5 times a week. If you develop chest pain, have severe difficulty breathing, or feel very tired, stop exercising immediately and seek medical attention   Please work on good  health habits so that your health will improve. 1. Commitment to daily physical activity for 30 to 60  minutes, if you are able to do this.  2. Commitment to wise food choices. Aim for half of your  food intake to be vegetable and fruit, one quarter starchy foods, and one quarter protein. Try to eat on a regular schedule  3 meals per day, snacking between meals should be limited to vegetables or fruits or small portions of nuts. 64 ounces of water per day is generally recommended, unless you have specific health conditions, like heart failure or kidney failure where you will need to limit fluid intake.  3. Commitment to sufficient and a  good quality of physical and mental rest daily, generally between 6 to 8 hours per day.  WITH PERSISTANCE AND PERSEVERANCE, THE IMPOSSIBLE , BECOMES THE NORM!  Thank you  for choosing [facility]. We consider it a privelige to serve you.  Delivering excellent health care in a caring and  compassionate way is our goal.  Partnering with you,  so that together we can achieve this goal is our strategy.   Insomnia Insomnia is a sleep disorder that makes it difficult to fall asleep or to stay asleep. Insomnia can cause tiredness (fatigue), low energy, difficulty concentrating, mood swings, and poor performance at work or school. There are three different ways to classify insomnia:  Difficulty falling asleep.  Difficulty staying asleep.  Waking up too early in the morning. Any type of insomnia can be long-term (chronic) or short-term (acute).  Both are common. Short-term insomnia usually lasts for three months or less. Chronic insomnia occurs at least three times a week for longer than three months. What are the causes? Insomnia may be caused by another condition, situation, or substance, such as:  Anxiety.  Certain medicines.  Gastroesophageal reflux disease (GERD) or other gastrointestinal conditions.  Asthma or other breathing conditions.  Restless legs syndrome, sleep apnea, or other sleep disorders.  Chronic pain.  Menopause. This may include hot flashes.  Stroke.  Abuse of alcohol, tobacco, or illegal drugs.  Depression.  Caffeine.  Neurological disorders, such as Alzheimer disease.  An overactive thyroid (hyperthyroidism). The cause of insomnia may not be known. What increases the risk? Risk factors for insomnia include:  Gender. Women are more commonly affected than men.  Age. Insomnia is more common as you get older.  Stress. This may involve your professional or personal life.  Income. Insomnia is more common in people with lower income.  Lack of exercise.  Irregular work schedule or night shifts.  Traveling between different time zones. What are the signs or symptoms? If you have insomnia, trouble falling asleep or trouble staying asleep is the main symptom. This may lead to other symptoms, such as:  Feeling fatigued.  Feeling nervous about going to sleep.  Not feeling rested in the morning.  Having trouble concentrating.  Feeling irritable, anxious, or depressed. How is this treated? Treatment for insomnia depends on the cause. If your insomnia  is caused by an underlying condition, treatment will focus on addressing the condition. Treatment may also include:  Medicines to help you sleep.  Counseling or therapy.  Lifestyle adjustments. Follow these instructions at home:  Take medicines only as directed by your health care provider.  Keep regular sleeping and waking hours.  Avoid naps.  Keep a sleep diary to help you and your health care provider figure out what could be causing your insomnia. Include:  When you sleep.  When you wake up during the night.  How well you sleep.  How rested you feel the next day.  Any side effects of medicines you are taking.  What you eat and drink.  Make your bedroom a comfortable place where it is easy to fall asleep:  Put up shades or special blackout curtains to block light from outside.  Use a white noise machine to block noise.  Keep the temperature cool.  Exercise regularly as directed by your health care provider. Avoid exercising right before bedtime.  Use relaxation techniques to manage stress. Ask your health care provider to suggest some techniques that may work well for you. These may include:  Breathing exercises.  Routines to release muscle tension.  Visualizing peaceful scenes.  Cut back on alcohol, caffeinated beverages, and cigarettes, especially close to bedtime. These can disrupt your sleep.  Do not overeat or eat spicy foods right before bedtime. This can lead to digestive discomfort that can make it hard for you to sleep.  Limit screen use before bedtime. This includes:  Watching TV.  Using your smartphone, tablet, and computer.  Stick to a routine. This can help you fall asleep faster. Try to do a quiet activity, brush your teeth, and go to bed at the same time each night.  Get out of bed if you are still awake after 15 minutes of trying to sleep. Keep the lights down, but try reading or doing a quiet activity. When you feel sleepy, go back to bed.  Make sure that you drive carefully. Avoid driving if you feel very sleepy.  Keep all follow-up appointments as directed by your health care provider. This is important. Contact a health care provider if:  You are tired throughout the day or have trouble in your daily routine due to sleepiness.  You continue to have sleep problems or  your sleep problems get worse. Get help right away if:  You have serious thoughts about hurting yourself or someone else. This information is not intended to replace advice given to you by your health care provider. Make sure you discuss any questions you have with your health care provider. Document Released: 12/28/1999 Document Revised: 06/01/2015 Document Reviewed: 09/30/2013 Elsevier Interactive Patient Education  2017 [employer].

## 2016-01-24 ENCOUNTER — Encounter: Payer: Self-pay | Admitting: Family Medicine

## 2016-01-24 NOTE — Assessment & Plan Note (Signed)
Updated lab needed at/ before next visit.   

## 2016-01-24 NOTE — Assessment & Plan Note (Signed)
Sleep hygiene reviewed and written information offered also. Prescription sent for  medication needed.  

## 2016-01-24 NOTE — Assessment & Plan Note (Signed)
Hyperlipidemia:Low fat diet discussed and encouraged.   Lipid Panel  Lab Results  Component Value Date   CHOL 197 04/05/2014   HDL 56 04/05/2014   LDLCALC 130 (H) 04/05/2014   TRIG 55 04/05/2014   CHOLHDL 3.5 04/05/2014    Updated lab needed at/ before next visit.

## 2016-01-24 NOTE — Progress Notes (Signed)
Chelsey Lewis     MRN: [id number]      DOB: 1970/12/13   HPI Chelsey Lewis is here for follow up and re-evaluation of chronic medical conditions, medication management and review of any available recent lab and radiology data.  Preventive health is updated, specifically  Cancer screening and Immunization.    Has modified lifestyle with excellent weight loss success and is applauded on this C/o poor sleep, difficulty both falling and staying asleep   ROS Denies recent fever or chills. Denies sinus pressure, nasal congestion, ear pain or sore throat. Denies chest congestion, productive cough or wheezing. Denies chest pains, palpitations and leg swelling Denies abdominal pain, nausea, vomiting,diarrhea or constipation.   Denies dysuria, frequency, hesitancy or incontinence. Denies joint pain, swelling and limitation in mobility. Denies headaches, seizures, numbness, or tingling. Denies depression, anxiety , brain can't "slow down" Denies skin break down or rash.   PE  BP 128/80 (BP Location: Left Arm, Patient Position: Sitting, Cuff Size: Normal)   Pulse 64   Temp 99 F (37.2 C) (Oral)   Resp 16   Ht 5\' 5"  (1.651 m)   Wt 179 lb (81.2 kg)   SpO2 99%   BMI 29.79 kg/m   Patient alert and oriented and in no cardiopulmonary distress.  HEENT: No facial asymmetry, EOMI,   oropharynx pink and moist.  Neck supple no JVD, no mass.  Chest: Clear to auscultation bilaterally.  CVS: S1, S2 no murmurs, no S3.Regular rate.  ABD: Soft non tender.   Ext: No edema  MS: Adequate ROM spine, shoulders, hips and knees.  Skin: Intact, no ulcerations or rash noted.  Psych: Good eye contact, normal affect. Memory intact not anxious or depressed appearing.  CNS: CN 2-12 intact, power,  normal throughout.no focal deficits noted.   Assessment & Plan  Insomnia Sleep hygiene reviewed and written information offered also. Prescription sent for  medication needed.   Dyslipidemia, goal LDL  below 100 Hyperlipidemia:Low fat diet discussed and encouraged.   Lipid Panel  Lab Results  Component Value Date   CHOL 197 04/05/2014   HDL 56 04/05/2014   LDLCALC 130 (H) 04/05/2014   TRIG 55 04/05/2014   CHOLHDL 3.5 04/05/2014    Updated lab needed at/ before next visit.    Anemia Updated lab needed at/ before next visit.   Vitamin D deficiency Needs supplement  Prediabetes Patient educated about the importance of limiting  Carbohydrate intake , the need to commit to daily physical activity for a minimum of 30 minutes , and to commit weight loss. The fact that changes in all these areas will reduce or eliminate all together the development of diabetes is stressed.   Diabetic Labs Latest Ref Rng & Units 04/05/2014 04/18/2013 09/02/2012 05/05/2012 12/23/2011  HbA1c <5.7 % - 5.6 - - 5.7(H)  Chol 0 - 200 mg/dL 197 170 - 107 177  HDL >=46 mg/dL 56 39(L) - 33(L) 43  Calc LDL 0 - 99 mg/dL 130(H) 115(H) - 60 123(H)  Triglycerides <150 mg/dL 55 82 - 72 53  Creatinine 0.50 - 1.10 mg/dL 0.90 1.12(H) 0.97 1.04 1.06   BP/Weight 01/23/2016 07/19/2015 05/16/2015 03/02/2015 02/23/2015 10/12/2014 [id number]  Systolic BP 0000000 123XX123 0000000 [id number]  Diastolic BP 80 74 84 92 90 82 82  Wt. (Lbs) 179 190.12 192 186 184 173 170  BMI 29.79 31.64 31 30.04 29.71 28.79 28.29   No flowsheet data found.    Overweight (BMI 25.0-29.9) Improved. Pt  applauded on succesful weight loss through lifestyle change, and encouraged to continue same. Weight loss goal set for the next several months.   Nicotine dependence Patient counseled for approximately 5 minutes regarding the health risks of ongoing nicotine use, specifically all types of cancer, heart disease, stroke and respiratory failure. The options available for help with cessation ,the behavioral changes to assist the process, and the option to either gradully reduce usage  Or abruptly stop.is also discussed. Pt is also encouraged to set specific  goals in number of cigarettes used daily, as well as to set a quit date.

## 2016-01-27 ENCOUNTER — Encounter: Payer: Self-pay | Admitting: Family Medicine

## 2016-01-27 NOTE — Assessment & Plan Note (Signed)
Improved. Pt applauded on succesful weight loss through lifestyle change, and encouraged to continue same. Weight loss goal set for the next several months.  

## 2016-01-27 NOTE — Assessment & Plan Note (Signed)
Patient educated about the importance of limiting  Carbohydrate intake , the need to commit to daily physical activity for a minimum of 30 minutes , and to commit weight loss. The fact that changes in all these areas will reduce or eliminate all together the development of diabetes is stressed.   Diabetic Labs Latest Ref Rng & Units 04/05/2014 04/18/2013 09/02/2012 05/05/2012 12/23/2011  HbA1c <5.7 % - 5.6 - - 5.7(H)  Chol 0 - 200 mg/dL 197 170 - 107 177  HDL >=46 mg/dL 56 39(L) - 33(L) 43  Calc LDL 0 - 99 mg/dL 130(H) 115(H) - 60 123(H)  Triglycerides <150 mg/dL 55 82 - 72 53  Creatinine 0.50 - 1.10 mg/dL 0.90 1.12(H) 0.97 1.04 1.06   BP/Weight 01/23/2016 07/19/2015 05/16/2015 03/02/2015 02/23/2015 10/12/2014 [id number]  Systolic BP 0000000 123XX123 0000000 [id number]  Diastolic BP 80 74 84 92 90 82 82  Wt. (Lbs) 179 190.12 192 186 184 173 170  BMI 29.79 31.64 31 30.04 29.71 28.79 28.29   No flowsheet data found.

## 2016-01-27 NOTE — Assessment & Plan Note (Signed)

## 2016-01-27 NOTE — Assessment & Plan Note (Signed)
Needs supplement

## 2016-01-29 ENCOUNTER — Other Ambulatory Visit: Payer: Self-pay | Admitting: Family Medicine

## 2016-01-29 DIAGNOSIS — Z1231 Encounter for screening mammogram for malignant neoplasm of breast: Secondary | ICD-10-CM

## 2016-02-11 ENCOUNTER — Ambulatory Visit (HOSPITAL_COMMUNITY)
Admission: RE | Admit: 2016-02-11 | Discharge: 2016-02-11 | Disposition: A | Discharge status: Home / Self Care | Payer: BLUE CROSS/BLUE SHIELD | Source: Ambulatory Visit | Attending: Family Medicine | Admitting: Family Medicine

## 2016-02-11 ENCOUNTER — Encounter (HOSPITAL_BASED_OUTPATIENT_CLINIC_OR_DEPARTMENT_OTHER): Payer: Self-pay

## 2016-02-11 ENCOUNTER — Ambulatory Visit (HOSPITAL_BASED_OUTPATIENT_CLINIC_OR_DEPARTMENT_OTHER)
Admission: RE | Admit: 2016-02-11 | Discharge: 2016-02-11 | Disposition: A | Discharge status: Home / Self Care | Payer: BLUE CROSS/BLUE SHIELD | Source: Ambulatory Visit | Attending: Family Medicine | Admitting: Family Medicine

## 2016-02-11 DIAGNOSIS — Z1231 Encounter for screening mammogram for malignant neoplasm of breast: Secondary | ICD-10-CM

## 2016-02-13 ENCOUNTER — Ambulatory Visit: Payer: Self-pay

## 2016-02-26 ENCOUNTER — Other Ambulatory Visit: Payer: BLUE CROSS/BLUE SHIELD | Admitting: Adult Health

## 2016-02-27 ENCOUNTER — Other Ambulatory Visit: Payer: BLUE CROSS/BLUE SHIELD | Admitting: Adult Health

## 2016-03-13 ENCOUNTER — Ambulatory Visit (INDEPENDENT_AMBULATORY_CARE_PROVIDER_SITE_OTHER): Payer: BLUE CROSS/BLUE SHIELD | Admitting: Adult Health

## 2016-03-13 ENCOUNTER — Encounter: Payer: Self-pay | Admitting: Adult Health

## 2016-03-13 VITALS — BP 150/78 | HR 70 | Ht 66.0 in | Wt 182.0 lb

## 2016-03-13 DIAGNOSIS — Z1211 Encounter for screening for malignant neoplasm of colon: Secondary | ICD-10-CM | POA: Diagnosis not present

## 2016-03-13 DIAGNOSIS — Z780 Asymptomatic menopausal state: Secondary | ICD-10-CM | POA: Diagnosis not present

## 2016-03-13 DIAGNOSIS — Z01411 Encounter for gynecological examination (general) (routine) with abnormal findings: Secondary | ICD-10-CM | POA: Diagnosis not present

## 2016-03-13 DIAGNOSIS — Z01419 Encounter for gynecological examination (general) (routine) without abnormal findings: Secondary | ICD-10-CM

## 2016-03-13 DIAGNOSIS — R232 Flushing: Secondary | ICD-10-CM | POA: Diagnosis not present

## 2016-03-13 DIAGNOSIS — Z1212 Encounter for screening for malignant neoplasm of rectum: Secondary | ICD-10-CM

## 2016-03-13 LAB — HEMOCCULT GUIAC POC 1CARD (OFFICE): Fecal Occult Blood, POC: NEGATIVE

## 2016-03-13 NOTE — Patient Instructions (Signed)
Physical in 1 year Pap in 2020 Mammogram yearly Colonoscopy at 57

## 2016-03-13 NOTE — Progress Notes (Signed)
Patient ID: Chelsey Lewis, female   DOB: 14-Nov-1970, 46 y.o.   MRN: [id number] History of Present Illness:  Chelsey Lewis is a 46 year old black female in for well woman gyn exam, she had normal pap with negative HPV, 02/23/15.No period in about 5 months, she is aware if it has been a year and bleeds to let me know, having hot flashes and decreased sex drive. PCP is Dr Moshe Cipro.  Current Medications, Allergies, Past Medical History, Past Surgical History, Family History and Social History were reviewed in [employer] record.     Review of Systems: Patient denies any headaches, hearing loss, fatigue, blurred vision, shortness of breath, chest pain, abdominal pain, problems with bowel movements, urination, or intercourse. No joint pain or mood swings.See HPI for positives.    Physical Exam:BP (!) 150/78 (BP Location: Left Arm, Patient Position: Sitting, Cuff Size: Normal)   Pulse 70   Ht 5\' 6"  (1.676 m)   Wt 182 lb (82.6 kg)   LMP 10/11/2015 (Approximate)   BMI 29.38 kg/m  General:  Well developed, well nourished, no acute distress Skin:  Warm and dry Neck:  Midline trachea, normal thyroid, good ROM, no lymphadenopathy Lungs; Clear to auscultation bilaterally Breast:  No dominant palpable mass, retraction, or nipple discharge Cardiovascular: Regular rate and rhythm Abdomen:  Soft, non tender, no hepatosplenomegaly Pelvic:  External genitalia is normal in appearance, no lesions.  The vagina is normal in appearance. Urethra has no lesions or masses. The cervix is smooth.  Uterus is felt to be normal size, shape, and contour.  No adnexal masses or tenderness noted.Bladder is non tender, no masses felt. Rectal: Good sphincter tone, no polyps, or hemorrhoids felt.  Hemoccult negative. Extremities/musculoskeletal:  No swelling or varicosities noted, no clubbing or cyanosis Psych:  No mood changes, alert and cooperative,seems happy PHQ 2 score 0.  Impression: 1. Well woman exam  with routine gynecological exam   2. Screening for colorectal cancer   3. Postmenopausal   4. Hot flashes       Plan: Try Evening Primrose Oil Physical in 1 year Pap in 2020 Mammogram yearly Colonoscopy at 21

## 2016-03-17 ENCOUNTER — Other Ambulatory Visit: Payer: Self-pay | Admitting: Family Medicine

## 2016-03-17 ENCOUNTER — Ambulatory Visit (HOSPITAL_COMMUNITY)
Admission: RE | Admit: 2016-03-17 | Discharge: 2016-03-17 | Disposition: A | Discharge status: Home / Self Care | Payer: BLUE CROSS/BLUE SHIELD | Source: Ambulatory Visit | Attending: Family Medicine | Admitting: Family Medicine

## 2016-03-17 DIAGNOSIS — Z1231 Encounter for screening mammogram for malignant neoplasm of breast: Secondary | ICD-10-CM

## 2016-03-19 ENCOUNTER — Ambulatory Visit (INDEPENDENT_AMBULATORY_CARE_PROVIDER_SITE_OTHER): Payer: BLUE CROSS/BLUE SHIELD

## 2016-03-19 VITALS — BP 160/94 | HR 76

## 2016-03-19 DIAGNOSIS — I10 Essential (primary) hypertension: Secondary | ICD-10-CM

## 2016-03-25 ENCOUNTER — Encounter: Payer: Self-pay | Admitting: Family Medicine

## 2016-03-25 ENCOUNTER — Ambulatory Visit (INDEPENDENT_AMBULATORY_CARE_PROVIDER_SITE_OTHER): Payer: BLUE CROSS/BLUE SHIELD | Admitting: Family Medicine

## 2016-03-25 ENCOUNTER — Other Ambulatory Visit: Payer: Self-pay

## 2016-03-25 VITALS — BP 160/92 | HR 89 | Resp 15 | Ht 66.0 in | Wt 181.0 lb

## 2016-03-25 DIAGNOSIS — E559 Vitamin D deficiency, unspecified: Secondary | ICD-10-CM | POA: Diagnosis not present

## 2016-03-25 DIAGNOSIS — I10 Essential (primary) hypertension: Secondary | ICD-10-CM | POA: Diagnosis not present

## 2016-03-25 DIAGNOSIS — E663 Overweight: Secondary | ICD-10-CM | POA: Diagnosis not present

## 2016-03-25 DIAGNOSIS — R079 Chest pain, unspecified: Secondary | ICD-10-CM | POA: Insufficient documentation

## 2016-03-25 DIAGNOSIS — R7303 Prediabetes: Secondary | ICD-10-CM | POA: Diagnosis not present

## 2016-03-25 DIAGNOSIS — D649 Anemia, unspecified: Secondary | ICD-10-CM

## 2016-03-25 DIAGNOSIS — F17218 Nicotine dependence, cigarettes, with other nicotine-induced disorders: Secondary | ICD-10-CM | POA: Diagnosis not present

## 2016-03-25 DIAGNOSIS — E785 Hyperlipidemia, unspecified: Secondary | ICD-10-CM

## 2016-03-25 MED ORDER — AMLODIPINE BESYLATE 5 MG PO TABS: ORAL_TABLET | ORAL | 2 refills | Status: DC

## 2016-03-25 NOTE — Assessment & Plan Note (Addendum)
Start amlodipine at bedtime DASH diet and commitment to daily physical activity for a minimum of 30 minutes discussed and encouraged, as a part of hypertension management. The importance of attaining a healthy weight is also discussed.  BP/Weight 03/25/2016 03/19/2016 03/13/2016 01/23/2016 07/19/2015 05/16/2015 03/07/3610  Systolic BP 244 975 300 511 021 117 356  Diastolic BP 92 94 78 80 74 84 92  Wt. (Lbs) 181 - 182 179 190.12 192 186  BMI 29.21 - 29.38 29.79 31.64 31 30.04     EKG; no lVH , no ischemia, NSR

## 2016-03-25 NOTE — Assessment & Plan Note (Signed)
Patient educated about the importance of limiting  Carbohydrate intake , the need to commit to daily physical activity for a minimum of 30 minutes , and to commit weight loss. The fact that changes in all these areas will reduce or eliminate all together the development of diabetes is stressed.   Diabetic Labs Latest Ref Rng & Units 04/05/2014 04/18/2013 09/02/2012 05/05/2012 12/23/2011  HbA1c <5.7 % - 5.6 - - 5.7(H)  Chol 0 - 200 mg/dL 197 170 - 107 177  HDL >=46 mg/dL 56 39(L) - 33(L) 43  Calc LDL 0 - 99 mg/dL 130(H) 115(H) - 60 123(H)  Triglycerides <150 mg/dL 55 82 - 72 53  Creatinine 0.50 - 1.10 mg/dL 0.90 1.12(H) 0.97 1.04 1.06   BP/Weight 03/25/2016 03/19/2016 03/13/2016 01/23/2016 07/19/2015 05/16/2015 07/28/9676  Systolic BP 938 101 751 025 852 778 242  Diastolic BP 92 94 78 80 74 84 92  Wt. (Lbs) 181 - 182 179 190.12 192 186  BMI 29.21 - 29.38 29.79 31.64 31 30.04   No flowsheet data found.

## 2016-03-25 NOTE — Progress Notes (Signed)
Chelsey Lewis     MRN: [id number]      DOB: 1970-01-27   HPI Chelsey Lewis is here with new c/o ndewly diagnosed hypertension, 2 elevated at two separate venues in past 2 weeks. Record review shows sporadic elevated values in the past. Concerned, started experiencing chest pain, associated wit anxiety, she stopped exercise and denies radiation, associated nausea or lightheadedness. States everyone in her family has HTN ROS Denies recent fever or chills. Denies sinus pressure, nasal congestion, ear pain or sore throat. Denies chest congestion, productive cough or wheezing. Denies PND, orthopnea, palpitations and leg swelling Denies abdominal pain, nausea, vomiting,diarrhea or constipation.   Denies dysuria, frequency, hesitancy or incontinence. Denies joint pain, swelling and limitation in mobility. Denies headaches, seizures, numbness, or tingling. Denies depression, anxiety or insomnia. Denies skin break down or rash.   PE  BP (!) 160/92   Pulse 89   Resp 15   Ht 5\' 6"  (1.676 m)   Wt 181 lb (82.1 kg)   LMP 10/11/2015 (Approximate)   SpO2 96%   BMI 29.21 kg/m   Patient alert and oriented and in no cardiopulmonary distress.  HEENT: No facial asymmetry, EOMI,   oropharynx pink and moist.  Neck supple no JVD, no mass.  Chest: Clear to auscultation bilaterally.NO reproducible chest pain  CVS: S1, S2 no murmurs, no S3.Regular rate. EKG : NSR no ischemia or LVH  ABD: Soft non tender.   Ext: No edema  MS: Adequate ROM spine, shoulders, hips and knees.  Skin: Intact, no ulcerations or rash noted.  Psych: Good eye contact, normal affect. Memory intact not anxious or depressed appearing.  CNS: CN 2-12 intact, power,  normal throughout.no focal deficits noted.   Assessment & Plan  Chest pain in adult Reports chest discomfort since hearing bp is high, stopped exercise, no associated light headedness, nausea or diaphoresis, associated with anxiety re new dx. Office EKG: NSR,  no ischemia, no LVH  Essential hypertension Start amlodipine at bedtime DASH diet and commitment to daily physical activity for a minimum of 30 minutes discussed and encouraged, as a part of hypertension management. The importance of attaining a healthy weight is also discussed.  BP/Weight 03/25/2016 03/19/2016 03/13/2016 01/23/2016 07/19/2015 05/16/2015 9/47/0962  Systolic BP 836 629 476 546 503 546 568  Diastolic BP 92 94 78 80 74 84 92  Wt. (Lbs) 181 - 182 179 190.12 192 186  BMI 29.21 - 29.38 29.79 31.64 31 30.04     EKG; no lVH , no ischemia, NSR  Nicotine dependence Plan is to quit today, smoked up to one day ago Patient counseled for approximately 5 minutes regarding the health risks of ongoing nicotine use, specifically all types of cancer, heart disease, stroke and respiratory failure. The options available for help with cessation ,the behavioral changes to assist the process, and the option to either gradully reduce usage  Or abruptly stop.is also discussed. Pt is also encouraged to set specific goals in number of cigarettes used daily, as well as to set a quit date.     Overweight (BMI 25.0-29.9) Unchanged Patient re-educated about  the importance of commitment to a  minimum of 150 minutes of exercise per week.  The importance of healthy food choices with portion control discussed. Encouraged to start a food diary, count calories and to consider  joining a support group. Sample diet sheets offered. Goals set by the patient for the next several months.   Weight /BMI 03/25/2016 03/13/2016 01/23/2016  WEIGHT 181 lb 182 lb 179 lb  HEIGHT 5\' 6"  5\' 6"  5\' 5"   BMI 29.21 kg/m2 29.38 kg/m2 29.79 kg/m2      Prediabetes Patient educated about the importance of limiting  Carbohydrate intake , the need to commit to daily physical activity for a minimum of 30 minutes , and to commit weight loss. The fact that changes in all these areas will reduce or eliminate all together the development  of diabetes is stressed.   Diabetic Labs Latest Ref Rng & Units 04/05/2014 04/18/2013 09/02/2012 05/05/2012 12/23/2011  HbA1c <5.7 % - 5.6 - - 5.7(H)  Chol 0 - 200 mg/dL 197 170 - 107 177  HDL >=46 mg/dL 56 39(L) - 33(L) 43  Calc LDL 0 - 99 mg/dL 130(H) 115(H) - 60 123(H)  Triglycerides <150 mg/dL 55 82 - 72 53  Creatinine 0.50 - 1.10 mg/dL 0.90 1.12(H) 0.97 1.04 1.06   BP/Weight 03/25/2016 03/19/2016 03/13/2016 01/23/2016 07/19/2015 05/16/2015 9/32/6712  Systolic BP 458 099 833 825 053 976 734  Diastolic BP 92 94 78 80 74 84 92  Wt. (Lbs) 181 - 182 179 190.12 192 186  BMI 29.21 - 29.38 29.79 31.64 31 30.04   No flowsheet data found.

## 2016-03-25 NOTE — Patient Instructions (Addendum)
F/u with MD week of April 23, call if you need me sooner  [place] for cigarettes, you can do this  EKG shows no heart damage  Fasting labs for this week please  You do have high blood pressure, if you control it , then it will not damag your organs  Please work on good  health habits so that your health will improve. 1. Commitment to daily physical activity for 30 to 60  minutes, if you are able to do this.  2. Commitment to wise food choices. Aim for half of your  food intake to be vegetable and fruit, one quarter starchy foods, and one quarter protein. Try to eat on a regular schedule  3 meals per day, snacking between meals should be limited to vegetables or fruits or small portions of nuts. 64 ounces of water per day is generally recommended, unless you have specific health conditions, like heart failure or kidney failure where you will need to limit fluid intake.  3. Commitment to sufficient and a  good quality of physical and mental rest daily, generally between 6 to 8 hours per day.  WITH PERSISTANCE AND PERSEVERANCE, THE IMPOSSIBLE , BECOMES THE NORM!

## 2016-03-25 NOTE — Assessment & Plan Note (Signed)
Unchanged Patient re-educated about  the importance of commitment to a  minimum of 150 minutes of exercise per week.  The importance of healthy food choices with portion control discussed. Encouraged to start a food diary, count calories and to consider  joining a support group. Sample diet sheets offered. Goals set by the patient for the next several months.   Weight /BMI 03/25/2016 03/13/2016 01/23/2016  WEIGHT 181 lb 182 lb 179 lb  HEIGHT 5\' 6"  5\' 6"  5\' 5"   BMI 29.21 kg/m2 29.38 kg/m2 29.79 kg/m2

## 2016-03-25 NOTE — Assessment & Plan Note (Addendum)
Reports chest discomfort since hearing bp is high, stopped exercise, no associated light headedness, nausea or diaphoresis, associated with anxiety re new dx. Office EKG: NSR, no ischemia, no LVH

## 2016-03-25 NOTE — Assessment & Plan Note (Signed)
Plan is to quit today, smoked up to one day ago Patient counseled for approximately 5 minutes regarding the health risks of ongoing nicotine use, specifically all types of cancer, heart disease, stroke and respiratory failure. The options available for help with cessation ,the behavioral changes to assist the process, and the option to either gradully reduce usage  Or abruptly stop.is also discussed. Pt is also encouraged to set specific goals in number of cigarettes used daily, as well as to set a quit date.

## 2016-03-26 ENCOUNTER — Encounter: Payer: Self-pay | Admitting: Family Medicine

## 2016-03-27 LAB — CBC
HCT: 39.2 % (ref 35.0–45.0)
Hemoglobin: 12.6 g/dL (ref 11.7–15.5)
MCH: 26 pg — ABNORMAL LOW (ref 27.0–33.0)
MCHC: 32.1 g/dL (ref 32.0–36.0)
MCV: 80.8 fL (ref 80.0–100.0)
MPV: 9.1 fL (ref 7.5–12.5)
PLATELETS: 386 10*3/uL (ref 140–400)
RBC: 4.85 MIL/uL (ref 3.80–5.10)
RDW: 20.1 % — AB (ref 11.0–15.0)
WBC: 7.6 10*3/uL (ref 3.8–10.8)

## 2016-03-28 LAB — COMPREHENSIVE METABOLIC PANEL
ALBUMIN: 4.1 g/dL (ref 3.6–5.1)
ALT: 7 U/L (ref 6–29)
AST: 18 U/L (ref 10–35)
Alkaline Phosphatase: 72 U/L (ref 33–115)
BILIRUBIN TOTAL: 0.3 mg/dL (ref 0.2–1.2)
BUN: 9 mg/dL (ref 7–25)
CHLORIDE: 105 mmol/L (ref 98–110)
CO2: 23 mmol/L (ref 20–31)
CREATININE: 0.95 mg/dL (ref 0.50–1.10)
Calcium: 10.9 mg/dL — ABNORMAL HIGH (ref 8.6–10.2)
GLUCOSE: 76 mg/dL (ref 65–99)
Potassium: 4.8 mmol/L (ref 3.5–5.3)
SODIUM: 140 mmol/L (ref 135–146)
Total Protein: 7.6 g/dL (ref 6.1–8.1)

## 2016-03-28 LAB — LIPID PANEL
Cholesterol: 206 mg/dL — ABNORMAL HIGH (ref <200)
HDL: 55 mg/dL (ref >50)
LDL CALC: 140 mg/dL — AB (ref <100)
TRIGLYCERIDES: 56 mg/dL (ref <150)
Total CHOL/HDL Ratio: 3.7 Ratio (ref <5.0)
VLDL: 11 mg/dL (ref <30)

## 2016-03-28 LAB — HEMOGLOBIN A1C
Hgb A1c MFr Bld: 5.3 % (ref <5.7)
MEAN PLASMA GLUCOSE: 105 mg/dL

## 2016-03-28 LAB — VITAMIN D 25 HYDROXY (VIT D DEFICIENCY, FRACTURES): Vit D, 25-Hydroxy: 39 ng/mL (ref 30–100)

## 2016-04-10 ENCOUNTER — Ambulatory Visit (INDEPENDENT_AMBULATORY_CARE_PROVIDER_SITE_OTHER): Payer: BLUE CROSS/BLUE SHIELD | Admitting: Family Medicine

## 2016-04-10 ENCOUNTER — Encounter: Payer: Self-pay | Admitting: Family Medicine

## 2016-04-10 VITALS — BP 114/80 | HR 85 | Temp 98.5°F | Resp 16 | Ht 66.0 in | Wt 180.0 lb

## 2016-04-10 DIAGNOSIS — E663 Overweight: Secondary | ICD-10-CM | POA: Diagnosis not present

## 2016-04-10 DIAGNOSIS — F5101 Primary insomnia: Secondary | ICD-10-CM | POA: Diagnosis not present

## 2016-04-10 DIAGNOSIS — G4489 Other headache syndrome: Secondary | ICD-10-CM

## 2016-04-10 DIAGNOSIS — I10 Essential (primary) hypertension: Secondary | ICD-10-CM | POA: Diagnosis not present

## 2016-04-10 DIAGNOSIS — R7303 Prediabetes: Secondary | ICD-10-CM | POA: Diagnosis not present

## 2016-04-10 DIAGNOSIS — J302 Other seasonal allergic rhinitis: Secondary | ICD-10-CM

## 2016-04-10 DIAGNOSIS — E785 Hyperlipidemia, unspecified: Secondary | ICD-10-CM

## 2016-04-10 MED ORDER — PREDNISONE 5 MG PO TABS: 5.0000 mg | ORAL_TABLET | Freq: Two times a day (BID) | ORAL | 0 refills | Status: AC

## 2016-04-10 MED ORDER — LORATADINE 10 MG PO TABS: 10.0000 mg | ORAL_TABLET | Freq: Every day | ORAL | 11 refills | Status: DC

## 2016-04-10 MED ORDER — AZELASTINE HCL 0.1 % NA SOLN: 2.0000 | Freq: Two times a day (BID) | NASAL | 12 refills | Status: DC

## 2016-04-10 NOTE — Patient Instructions (Addendum)
f/u in  6 months, cancel the 2 earlier appointments  Nasal spray for allergies is prescribed and also 5 day course of prednisone , and daily claritin/ loratdine  Excellent blood pressure, continue medication  Fasting lipid and chem 7 in 5 month 3 weeks  Thanks for choosing [facility], we consider it a privelige to serve you.    Nasal Allergies Nasal allergies are a reaction to allergens in the air. Allergens are tiny specks (particles) in the air that cause your body to have an allergic reaction. Nasal allergies are not passed from person to person (contagious). They cannot be cured, but they can be controlled. Common causes of nasal allergies include:  Pollen from grasses, trees, and weeds.  House dust mites.  Pet dander.  Mold. Follow these instructions at home:  Avoid the allergen that is causing your symptoms, if you can.  Keep windows closed. If possible, use air conditioning when there is a lot of pollen in the air.  Do not use fans in your home.  Do not hang clothes outside to dry.  Wear sunglasses to keep pollen out of your eyes.  Wash your hands right away after you touch household pets.  Take over-the-counter and prescription medicines only as told by your doctor.  Keep all follow-up visits as told by your doctor. This is important. Contact a doctor if:  You have a fever.  You have a cough that does not go away (is persistent).  You start to make whistling sounds when you breathe (wheeze).  Your symptoms do not get better with treatment.  You have thick fluid coming from your nose.  You start to have nosebleeds. Get help right away if:  Your tongue or your lips are swollen.  You have trouble breathing.  You feel light-headed or you feel like you are going to pass out (faint).  You have cold sweats. This information is not intended to replace advice given to you by your health care provider. Make sure you discuss any questions you  have with your health care provider. Document Released: 05/01/2010 Document Revised: 06/07/2015 Document Reviewed: 07/12/2014 Elsevier Interactive Patient Education  2017 [employer].

## 2016-04-12 DIAGNOSIS — R519 Headache, unspecified: Secondary | ICD-10-CM | POA: Insufficient documentation

## 2016-04-12 DIAGNOSIS — R51 Headache: Secondary | ICD-10-CM

## 2016-04-12 NOTE — Assessment & Plan Note (Signed)
Deteriorated. Patient re-educated about  the importance of commitment to a  minimum of 150 minutes of exercise per week.  The importance of healthy food choices with portion control discussed. Encouraged to start a food diary, count calories and to consider  joining a support group. Sample diet sheets offered. Goals set by the patient for the next several months.   Weight /BMI 04/10/2016 03/25/2016 03/13/2016  WEIGHT 180 lb 181 lb 182 lb  HEIGHT 5\' 6"  5\' 6"  5\' 6"   BMI 29.05 kg/m2 29.21 kg/m2 29.38 kg/m2

## 2016-04-12 NOTE — Assessment & Plan Note (Signed)
Controlled, no change in medication DASH diet and commitment to daily physical activity for a minimum of 30 minutes discussed and encouraged, as a part of hypertension management. The importance of attaining a healthy weight is also discussed.  BP/Weight 04/10/2016 03/25/2016 03/19/2016 03/13/2016 01/23/2016 03/13/4968 02/19/3783  Systolic BP 885 027 741 287 867 672 094  Diastolic BP 80 92 94 78 80 74 84  Wt. (Lbs) - 181 - 182 179 190.12 192  BMI - 29.21 - 29.38 29.79 31.64 31

## 2016-04-12 NOTE — Assessment & Plan Note (Signed)
Uncontrolled, resulting in headache, fatigue and excessive nasal congestion. Missed work for prior 2 days and requests excuse . Letter starting from 2 days prior to return in am written Start daily loratadine and Astelin and short course prednisone prescribed, also saline nasal flushes advised

## 2016-04-12 NOTE — Assessment & Plan Note (Signed)
Frontal and maxillary headache from uncontrolled allergic sinusitis, pt to use tylenol for relief

## 2016-04-12 NOTE — Progress Notes (Signed)
Chelsey Lewis     MRN: [id number]      DOB: 1970/07/22   HPI Chelsey Lewis is here  With a 3 day h/o excessive frontal and maxillary pressure causing severe headache and preventing her ability to function. Also has excess nasal congestion with difficulty breathing. She denies fever , chills or sore throat, she does cough but no sputum production, has ear pressure but no pain. States she has been unable to work  for the past 2 days because of feeling ill.c/o excess sneezing and watery eyes Denies adverse s/e from  Antihypertensive, medication started at her last visit .   Preventive health is updated, specifically  Cancer screening and Immunization.   Questions or concerns regarding consultations or procedures which the PT has had in the interim are  addressed. The PT denies any adverse reactions to current medications since the last visit.  ROS See HPI  Denies chest pains, palpitations and leg swelling Denies abdominal pain, nausea, vomiting,diarrhea or constipation.   Denies dysuria, frequency, hesitancy or incontinence. Denies joint pain, swelling and limitation in mobility. Denies , seizures, numbness, or tingling. Denies depression, anxiety increased  insomnia.due to cough Denies skin break down or rash.   PE  BP 114/80   Pulse 85   Temp 98.5 F (36.9 C) (Oral)   Resp 16   Ht 5\' 6"  (1.676 m)   LMP 10/11/2015 (Approximate)   SpO2 97%   Patient alert and oriented and in no cardiopulmonary distress.  HEENT: No facial asymmetry, EOMI,   oropharynx pink and moist.  Neck supple no JVD, no mass.Frontal and maxillary pressure , left greater than right, nasal mucosa erythematous and edematous, TM clear  Chest: Clear to auscultation bilaterally.  CVS: S1, S2 no murmurs, no S3.Regular rate.  ABD: Soft non tender.   Ext: No edema  MS: Adequate ROM spine, shoulders, hips and knees.  Skin: Intact, no ulcerations or rash noted.  Psych: Good eye contact, normal affect. Memory intact  not anxious or depressed appearing.  CNS: CN 2-12 intact, power,  normal throughout.no focal deficits noted.   Assessment & Plan  Allergic rhinitis Uncontrolled, resulting in headache, fatigue and excessive nasal congestion. Missed work for prior 2 days and requests excuse . Letter starting from 2 days prior to return in am written Start daily loratadine and Astelin and short course prednisone prescribed, also saline nasal flushes advised  Essential hypertension Controlled, no change in medication DASH diet and commitment to daily physical activity for a minimum of 30 minutes discussed and encouraged, as a part of hypertension management. The importance of attaining a healthy weight is also discussed.  BP/Weight 04/10/2016 03/25/2016 03/19/2016 03/13/2016 01/23/2016 04/15/3293 01/20/8414  Systolic BP 606 301 601 093 235 573 220  Diastolic BP 80 92 94 78 80 74 84  Wt. (Lbs) - 181 - 182 179 190.12 192  BMI - 29.21 - 29.38 29.79 31.64 31       Overweight (BMI 25.0-29.9) Deteriorated. Patient re-educated about  the importance of commitment to a  minimum of 150 minutes of exercise per week.  The importance of healthy food choices with portion control discussed. Encouraged to start a food diary, count calories and to consider  joining a support group. Sample diet sheets offered. Goals set by the patient for the next several months.   Weight /BMI 04/10/2016 03/25/2016 03/13/2016  WEIGHT 180 lb 181 lb 182 lb  HEIGHT 5\' 6"  5\' 6"  5\' 6"   BMI 29.05 kg/m2 29.21  kg/m2 29.38 kg/m2      Headache Frontal and maxillary headache from uncontrolled allergic sinusitis, pt to use tylenol for relief  Insomnia Sleep hygiene reviewed and written information offered also. Prescription sent for  medication needed.

## 2016-04-12 NOTE — Assessment & Plan Note (Signed)
Sleep hygiene reviewed and written information offered also. Prescription sent for  medication needed.  

## 2016-05-06 ENCOUNTER — Ambulatory Visit: Payer: Self-pay | Admitting: Family Medicine

## 2016-05-13 ENCOUNTER — Telehealth: Payer: Self-pay | Admitting: Family Medicine

## 2016-05-13 ENCOUNTER — Other Ambulatory Visit: Payer: Self-pay

## 2016-05-13 MED ORDER — AMLODIPINE BESYLATE 5 MG PO TABS: ORAL_TABLET | ORAL | 3 refills | Status: DC

## 2016-05-13 NOTE — Telephone Encounter (Signed)
Med refilled.

## 2016-05-13 NOTE — Telephone Encounter (Signed)
Patient is requesting a refill on her blood pressure medication. She has 1 pill left.

## 2016-05-23 ENCOUNTER — Telehealth: Payer: Self-pay | Admitting: Family Medicine

## 2016-05-23 NOTE — Telephone Encounter (Signed)
Patient does not have any more BP medicine, please call in @ [facility].

## 2016-05-23 NOTE — Telephone Encounter (Signed)
She needs to contact the pharmacy. This was done last week

## 2016-05-26 DIAGNOSIS — R51 Headache: Secondary | ICD-10-CM

## 2016-05-28 ENCOUNTER — Telehealth: Payer: Self-pay | Admitting: Family Medicine

## 2016-05-28 NOTE — Telephone Encounter (Signed)
I called patient to let her know that forms are ready to be faxed or picked up.  Our office will need to collect $29 for processing and prior to faxing to [facility] [employer] or patient picking up.

## 2016-06-19 ENCOUNTER — Telehealth: Payer: Self-pay | Admitting: Family Medicine

## 2016-06-19 NOTE — Telephone Encounter (Signed)
Patient called in complaining of left leg swelling/pain. Requesting an appt, there is nothing open until last week in July. I have put her on a my cancellation list and she is ok with seeing Dr.Nelson if Dr.Simpson is not here. Cb#: [phone]

## 2016-06-20 NOTE — Telephone Encounter (Signed)
Ok to schedule for Monday at 10:00 (slot was opened)

## 2016-06-25 ENCOUNTER — Encounter: Payer: Self-pay | Admitting: Family Medicine

## 2016-06-25 ENCOUNTER — Ambulatory Visit (HOSPITAL_COMMUNITY)
Admission: RE | Admit: 2016-06-25 | Discharge: 2016-06-25 | Disposition: A | Discharge status: Home / Self Care | Payer: BLUE CROSS/BLUE SHIELD | Source: Ambulatory Visit | Attending: Family Medicine | Admitting: Family Medicine

## 2016-06-25 ENCOUNTER — Ambulatory Visit (INDEPENDENT_AMBULATORY_CARE_PROVIDER_SITE_OTHER): Payer: BLUE CROSS/BLUE SHIELD | Admitting: Family Medicine

## 2016-06-25 VITALS — BP 138/82 | HR 75 | Resp 16 | Ht 66.0 in | Wt 191.0 lb

## 2016-06-25 DIAGNOSIS — F17218 Nicotine dependence, cigarettes, with other nicotine-induced disorders: Secondary | ICD-10-CM | POA: Diagnosis not present

## 2016-06-25 DIAGNOSIS — I10 Essential (primary) hypertension: Secondary | ICD-10-CM

## 2016-06-25 DIAGNOSIS — M25562 Pain in left knee: Secondary | ICD-10-CM | POA: Insufficient documentation

## 2016-06-25 DIAGNOSIS — J309 Allergic rhinitis, unspecified: Secondary | ICD-10-CM

## 2016-06-25 DIAGNOSIS — G8929 Other chronic pain: Secondary | ICD-10-CM | POA: Diagnosis not present

## 2016-06-25 DIAGNOSIS — E663 Overweight: Secondary | ICD-10-CM | POA: Diagnosis not present

## 2016-06-25 DIAGNOSIS — E785 Hyperlipidemia, unspecified: Secondary | ICD-10-CM | POA: Diagnosis not present

## 2016-06-25 DIAGNOSIS — F5101 Primary insomnia: Secondary | ICD-10-CM

## 2016-06-25 MED ORDER — RANITIDINE HCL 300 MG PO TABS: 300.0000 mg | ORAL_TABLET | Freq: Every day | ORAL | 0 refills | Status: DC

## 2016-06-25 MED ORDER — IBUPROFEN 800 MG PO TABS: 800.0000 mg | ORAL_TABLET | Freq: Three times a day (TID) | ORAL | 0 refills | Status: DC

## 2016-06-25 MED ORDER — TEMAZEPAM 30 MG PO CAPS: 30.0000 mg | ORAL_CAPSULE | Freq: Every evening | ORAL | 3 refills | Status: DC | PRN

## 2016-06-25 MED ORDER — PREDNISONE 5 MG (21) PO TBPK: 5.0000 mg | ORAL_TABLET | ORAL | 0 refills | Status: DC

## 2016-06-25 NOTE — Patient Instructions (Addendum)
F/u in 5 months, call if you need me before  Xray of left knee today, and 3 medications sent in  Please work on good  health habits so that your health will improve. 1. Commitment to daily physical activity for 30 to 60  minutes, if you are able to do this.  2. Commitment to wise food choices. Aim for half of your  food intake to be vegetable and fruit, one quarter starchy foods, and one quarter protein. Try to eat on a regular schedule  3 meals per day, snacking between meals should be limited to vegetables or fruits or small portions of nuts. 64 ounces of water per day is generally recommended, unless you have specific health conditions, like heart failure or kidney failure where you will need to limit fluid intake.  3. Commitment to sufficient and a  good quality of physical and mental rest daily, generally between 6 to 8 hours per day.  WITH PERSISTANCE AND PERSEVERANCE, THE IMPOSSIBLE , BECOMES THE NORM! Weight loss goal of 10 pounds    It is important that you exercise regularly at least 30 minutes 5 times a week. If you develop chest pain, have severe difficulty breathing, or feel very tired, stop exercising immediately and seek medical attention   pls cut back on fatty foods  Start oTC vit D3 200 IU daily

## 2016-06-27 ENCOUNTER — Encounter: Payer: Self-pay | Admitting: Family Medicine

## 2016-06-27 DIAGNOSIS — M25562 Pain in left knee: Secondary | ICD-10-CM

## 2016-06-27 DIAGNOSIS — G8929 Other chronic pain: Secondary | ICD-10-CM | POA: Insufficient documentation

## 2016-06-27 NOTE — Assessment & Plan Note (Addendum)
One month h/o increased pain limiting ability to exercise only  on the days that she works.occurs at the end of the workday and is less on awakening after resting her leg Pain is from knee , up to thigh ands down to the leg also Short anti inflammatory course. X ray of knee   Weight loss No symptoms or signs consistent with DVT

## 2016-06-27 NOTE — Assessment & Plan Note (Signed)

## 2016-06-27 NOTE — Assessment & Plan Note (Signed)
Deteriorated. Patient re-educated about  the importance of commitment to a  minimum of 150 minutes of exercise per week.  The importance of healthy food choices with portion control discussed. Encouraged to start a food diary, count calories and to consider  joining a support group. Sample diet sheets offered. Goals set by the patient for the next several months.   Weight /BMI 06/25/2016 04/10/2016 03/25/2016  WEIGHT 191 lb 180 lb 181 lb  HEIGHT 5\' 6"  5\' 6"  5\' 6"   BMI 30.83 kg/m2 29.05 kg/m2 29.21 kg/m2

## 2016-06-27 NOTE — Assessment & Plan Note (Signed)
Sleep hygiene reviewed and written information offered also. Prescription sent for  medication needed.  

## 2016-06-27 NOTE — Assessment & Plan Note (Signed)
Controlled, no change in medication  

## 2016-06-27 NOTE — Assessment & Plan Note (Signed)
Hyperlipidemia:Low fat diet discussed and encouraged.   Lipid Panel  Lab Results  Component Value Date   CHOL 206 (H) 03/25/2016   HDL 55 03/25/2016   LDLCALC 140 (H) 03/25/2016   TRIG 56 03/25/2016   CHOLHDL 3.7 03/25/2016   Needs to reduce fried and fatty foods to reduce CV risk

## 2016-06-27 NOTE — Assessment & Plan Note (Signed)
Controlled, no change in medication DASH diet and commitment to daily physical activity for a minimum of 30 minutes discussed and encouraged, as a part of hypertension management. The importance of attaining a healthy weight is also discussed.  BP/Weight 06/25/2016 04/10/2016 03/25/2016 03/19/2016 03/13/2016 05/06/5359 04/16/3152  Systolic BP 008 676 195 093 267 124 580  Diastolic BP 82 80 92 94 78 80 74  Wt. (Lbs) 191 180 181 - 182 179 190.12  BMI 30.83 29.05 29.21 - 29.38 29.79 31.64

## 2016-06-27 NOTE — Progress Notes (Signed)
IANNA Lewis     MRN: [id number]      DOB: Dec 30, 1970   HPI [name] is here for follow up and re-evaluation of chronic medical conditions, medication management and review of any available recent lab and radiology data.  Preventive health is updated, specifically  Cancer screening and Immunization.    Went to the nutritionist, however not happy with the effect that her change in diet has had as she has f gf gained weight 1 month h/o left knee an leg pain on days that she works, relieved by rest  ROS Denies recent fever or chills. Denies sinus pressure, nasal congestion, ear pain or sore throat. Denies chest congestion, productive cough or wheezing. Denies chest pains, palpitations and leg swelling Denies abdominal pain, nausea, vomiting,diarrhea or constipation.   Denies dysuria, frequency, hesitancy or incontinence.  Denies headaches, seizures, numbness, or tingling. Denies depression, anxiety or insomnia. Denies skin break down or rash.   PE  BP 138/82   Pulse 75   Resp 16   Ht 5\' 6"  (1.676 m)   Wt 191 lb (86.6 kg)   LMP 10/11/2015 (Approximate)   SpO2 97%   BMI 30.83 kg/m   Patient alert and oriented and in no cardiopulmonary distress.  HEENT: No facial asymmetry, EOMI,   oropharynx pink and moist.  Neck supple no JVD, no mass.  Chest: Clear to auscultation bilaterally.  CVS: S1, S2 no murmurs, no S3.Regular rate.  ABD: Soft non tender.   Ext: No edema  MS: Adequate ROM spine, shoulders, hips and reduced in left  knee  Skin: Intact, no ulcerations or rash noted.  Psych: Good eye contact, normal affect. Memory intact not anxious or depressed appearing.  CNS: CN 2-12 intact, power,  normal throughout.no focal deficits noted.   Assessment & Plan Essential hypertension Controlled, no change in medication DASH diet and commitment to daily physical activity for a minimum of 30 minutes discussed and encouraged, as a part of hypertension management. The  importance of attaining a healthy weight is also discussed.  BP/Weight 06/25/2016 04/10/2016 03/25/2016 03/19/2016 03/13/2016 9/98/3382 5/0/5397  Systolic BP 673 419 379 024 097 353 299  Diastolic BP 82 80 92 94 78 80 74  Wt. (Lbs) 191 180 181 - 182 179 190.12  BMI 30.83 29.05 29.21 - 29.38 29.79 31.64       Allergic rhinitis Controlled, no change in medication   Dyslipidemia, goal LDL below 100 Hyperlipidemia:Low fat diet discussed and encouraged.   Lipid Panel  Lab Results  Component Value Date   CHOL 206 (H) 03/25/2016   HDL 55 03/25/2016   LDLCALC 140 (H) 03/25/2016   TRIG 56 03/25/2016   CHOLHDL 3.7 03/25/2016   Needs to reduce fried and fatty foods to reduce CV risk    Insomnia Sleep hygiene reviewed and written information offered also. Prescription sent for  medication needed.   Nicotine dependence Patient is asked and  confirms current  Nicotine use.  Five to seven minutes of time is spent in counseling the patient of the need to quit smoking  Advice to quit is delivered clearly specifically in reducing the risk of developing heart disease, having a stroke, or of developing all types of cancer, especially lung and oral cancer. Improvement in breathing and exercise tolerance and quality of life is also discussed, as is the economic benefit.  Assessment of willingness to quit or to make an attempt to quit is made and documented  Assistance in quit attempt  is made with several and varied options presented, based on patient's desire and need. These include  literature, local classes available, 1800 QUIT NOW number, OTC and prescription medication.  The GOAL to be NICOTINE FREE is re emphasized.  The patient has set a personal goal of either reduction or discontinuation and follow up is arranged between 6 an 16 weeks.    Overweight (BMI 25.0-29.9) Deteriorated. Patient re-educated about  the importance of commitment to a  minimum of 150 minutes of exercise per  week.  The importance of healthy food choices with portion control discussed. Encouraged to start a food diary, count calories and to consider  joining a support group. Sample diet sheets offered. Goals set by the patient for the next several months.   Weight /BMI 06/25/2016 04/10/2016 03/25/2016  WEIGHT 191 lb 180 lb 181 lb  HEIGHT 5\' 6"  5\' 6"  5\' 6"   BMI 30.83 kg/m2 29.05 kg/m2 29.21 kg/m2      Chronic pain of left knee One month h/o increased pain limiting ability to exercise only  on the days that she works.occurs at the end of the workday and is less on awakening after resting her leg Pain is from knee , up to thigh ands down to the leg also Short anti inflammatory course. X ray of knee   Weight loss No symptoms or signs consistent with DVT

## 2016-07-09 ENCOUNTER — Encounter: Payer: Self-pay | Admitting: Family Medicine

## 2016-07-10 ENCOUNTER — Other Ambulatory Visit: Payer: Self-pay | Admitting: Family Medicine

## 2016-09-03 ENCOUNTER — Ambulatory Visit (INDEPENDENT_AMBULATORY_CARE_PROVIDER_SITE_OTHER): Payer: Self-pay | Admitting: Family Medicine

## 2016-09-03 ENCOUNTER — Encounter: Payer: Self-pay | Admitting: Family Medicine

## 2016-09-03 VITALS — BP 128/80 | HR 88 | Resp 16 | Ht 66.0 in | Wt 194.0 lb

## 2016-09-03 DIAGNOSIS — Z23 Encounter for immunization: Secondary | ICD-10-CM

## 2016-09-03 DIAGNOSIS — G8929 Other chronic pain: Secondary | ICD-10-CM

## 2016-09-03 DIAGNOSIS — F5101 Primary insomnia: Secondary | ICD-10-CM

## 2016-09-03 DIAGNOSIS — I10 Essential (primary) hypertension: Secondary | ICD-10-CM

## 2016-09-03 DIAGNOSIS — M25562 Pain in left knee: Secondary | ICD-10-CM

## 2016-09-03 DIAGNOSIS — J302 Other seasonal allergic rhinitis: Secondary | ICD-10-CM

## 2016-09-03 DIAGNOSIS — E669 Obesity, unspecified: Secondary | ICD-10-CM

## 2016-09-03 MED ORDER — PHENTERMINE HCL 37.5 MG PO TABS: 37.5000 mg | ORAL_TABLET | Freq: Every day | ORAL | 0 refills | Status: DC

## 2016-09-03 NOTE — Patient Instructions (Addendum)
F/u in November, call if you need me before  Congrats on stopping  Smoking  New is Half phentermine daily weight loss goal of 10 pounds  You are referred to orthopedics a re the left knee  Thank you  for choosing [facility]. We consider it a privelige to serve you.  Delivering excellent health care in a caring and  compassionate way is our goal.  Partnering with you,  so that together we can achieve this goal is our strategy.

## 2016-09-03 NOTE — Assessment & Plan Note (Addendum)
Left knee pain x 8 months, popped 1 week ago, wearing a brace since, negative xray in June, needs ortho eval and management , referral done

## 2016-09-05 ENCOUNTER — Encounter: Payer: Self-pay | Admitting: Family Medicine

## 2016-09-05 NOTE — Assessment & Plan Note (Signed)
Controlled, no change in medication DASH diet and commitment to daily physical activity for a minimum of 30 minutes discussed and encouraged, as a part of hypertension management. The importance of attaining a healthy weight is also discussed.  BP/Weight 09/03/2016 06/25/2016 04/10/2016 03/25/2016 03/19/2016 03/13/2016 0/31/2811  Systolic BP 886 773 736 681 594 707 615  Diastolic BP 80 82 80 92 94 78 80  Wt. (Lbs) 194 191 180 181 - 182 179  BMI 31.31 30.83 29.05 29.21 - 29.38 29.79

## 2016-09-05 NOTE — Assessment & Plan Note (Signed)
Controlled, no change in medication  

## 2016-09-05 NOTE — Progress Notes (Signed)
   Chelsey Lewis     MRN: [id number]      DOB: 1970-02-16   HPI Chelsey Lewis is here with an 8 month h/o worsening left knee pain and instability , seen about 2 months ago with the complaint and here today reporting an episode of instability when she felt a "pop": in the past 1 week. States she actually quit her job of 8 years 2 months ago , when she did not have her job responsibilities changed to accommodate her knee C/o weight gain and requests appetite suppressant to address this  ROS Denies recent fever or chills. Denies sinus pressure, nasal congestion, ear pain or sore throat. Denies chest congestion, productive cough or wheezing. Denies chest pains, palpitations and leg swelling Denies abdominal pain, nausea, vomiting,diarrhea or constipation.   Denies dysuria, frequency, hesitancy or incontinence. . Denies headaches, seizures, numbness, or tingling. Denies depression, anxiety her  Insomnia is adequately treated  Denies skin break down or rash.   PE  BP 128/80   Pulse 88   Resp 16   Ht 5\' 6"  (1.676 m)   Wt 194 lb (88 kg)   LMP 10/11/2015 (Approximate)   SpO2 98%   BMI 31.31 kg/m   Patient alert and oriented and in no cardiopulmonary distress.  HEENT: No facial asymmetry, EOMI,   oropharynx pink and moist.  Neck supple no JVD, no mass.  Chest: Clear to auscultation bilaterally.  CVS: S1, S2 no murmurs, no S3.Regular rate.  ABD: Soft non tender.   Ext: No edema  MS: Adequate ROM spine, shoulders, hips and reduced in left  Knee which is deformed.  Skin: Intact, no ulcerations or rash noted.  Psych: Good eye contact, normal affect. Memory intact not anxious or depressed appearing.  CNS: CN 2-12 intact, power,  normal throughout.no focal deficits noted.   Assessment & Plan  Chronic pain of left knee Left knee pain x 8 months, popped 1 week ago, wearing a brace since, negative xray in June, needs ortho eval and management , referral done  Essential  hypertension Controlled, no change in medication DASH diet and commitment to daily physical activity for a minimum of 30 minutes discussed and encouraged, as a part of hypertension management. The importance of attaining a healthy weight is also discussed.  BP/Weight 09/03/2016 06/25/2016 04/10/2016 03/25/2016 03/19/2016 03/13/2016 3/47/4259  Systolic BP 563 875 643 329 518 841 660  Diastolic BP 80 82 80 92 94 78 80  Wt. (Lbs) 194 191 180 181 - 182 179  BMI 31.31 30.83 29.05 29.21 - 29.38 29.79       Obesity (BMI 30.0-34.9) Deteriorated. Patient re-educated about  the importance of commitment to a  minimum of 150 minutes of exercise per week.  The importance of healthy food choices with portion control discussed. Encouraged to start a food diary, count calories and to consider  joining a support group. Sample diet sheets offered. Goals set by the patient for the next several months.   Weight /BMI 09/03/2016 06/25/2016 04/10/2016  WEIGHT 194 lb 191 lb 180 lb  HEIGHT 5\' 6"  5\' 6"  5\' 6"   BMI 31.31 kg/m2 30.83 kg/m2 29.05 kg/m2    Start half phentermine daily, target weight ;loss of 8 to 10 pounds in 4 months  Allergic rhinitis Controlled, no change in medication   Insomnia Controlled, no change in medication

## 2016-09-05 NOTE — Assessment & Plan Note (Signed)
Deteriorated. Patient re-educated about  the importance of commitment to a  minimum of 150 minutes of exercise per week.  The importance of healthy food choices with portion control discussed. Encouraged to start a food diary, count calories and to consider  joining a support group. Sample diet sheets offered. Goals set by the patient for the next several months.   Weight /BMI 09/03/2016 06/25/2016 04/10/2016  WEIGHT 194 lb 191 lb 180 lb  HEIGHT 5\' 6"  5\' 6"  5\' 6"   BMI 31.31 kg/m2 30.83 kg/m2 29.05 kg/m2    Start half phentermine daily, target weight ;loss of 8 to 10 pounds in 4 months

## 2016-09-09 ENCOUNTER — Ambulatory Visit (INDEPENDENT_AMBULATORY_CARE_PROVIDER_SITE_OTHER): Payer: Self-pay | Admitting: Orthopaedic Surgery

## 2016-09-09 ENCOUNTER — Encounter: Payer: Self-pay | Admitting: Orthopaedic Surgery

## 2016-09-09 VITALS — BP 121/80 | HR 80 | Temp 98.5°F | Ht 67.0 in | Wt 193.0 lb

## 2016-09-09 DIAGNOSIS — M25562 Pain in left knee: Secondary | ICD-10-CM

## 2016-09-09 DIAGNOSIS — G8929 Other chronic pain: Secondary | ICD-10-CM

## 2016-09-09 MED ORDER — NAPROXEN 500 MG PO TABS: 500.0000 mg | ORAL_TABLET | Freq: Two times a day (BID) | ORAL | 5 refills | Status: DC

## 2016-09-09 NOTE — Progress Notes (Signed)
Subjective:    Patient ID: Chelsey Lewis, female    DOB: 01/24/1970, 46 y.o.   MRN: [id number]  HPI She has had left knee pain since February or March getting gradually worse.  She quit her job in May because of the pain.  She saw Dr. Moshe Cipro in June and had X-rays done 06-25-16 of the left knee which were negative.  She continues to have pain.  She has swelling.  She has tried Tylenol, ice and rubs.  She had increased pain about two weeks ago and felt a pop.  She cannot fully flex the knee now.  She has pain laterally and posteriorly. She has no redness, no numbness, no pain of the right knee.   Review of Systems  HENT: Negative for congestion.   Respiratory: Negative for cough and shortness of breath.   Cardiovascular: Negative for chest pain and leg swelling.  Endocrine: Negative for cold intolerance.  Musculoskeletal: Positive for arthralgias, gait problem and joint swelling.  Allergic/Immunologic: Negative for environmental allergies.  Neurological: Positive for headaches.   Past Medical History:  Diagnosis Date  . Anemia   . Constipation   . DUB (dysfunctional uterine bleeding) 05/05/2012  . Hot flashes 02/23/2015  . Hyperlipidemia   . Irregular periods/menstrual cycles 02/23/2015  . Migraine headache   . Overweight(278.02)   . Peri-menopausal 05/12/2012  . Peri-menopause 02/23/2015  . Postmenopausal 03/02/2015  . Tobacco abuse   . Tubal pregnancy   . Vaginal discharge   . Well adult exam     Past Surgical History:  Procedure Laterality Date  . 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 ECTOPIC PREGNANCY SURGERY    . oopherectomy and tubal ligation    . TUBAL LIGATION      Current Outpatient Prescriptions on File Prior to Visit  Medication Sig Dispense Refill  . amLODipine (NORVASC) 5 MG tablet One tablet at 9 pm every night for blood pressure 30 tablet 3  . azelastine (ASTELIN) 0.1 % nasal spray Place 2 sprays into both  nostrils 2 (two) times daily. Use in each nostril as directed 30 mL 12  . ibuprofen (ADVIL,MOTRIN) 800 MG tablet Take 1 tablet (800 mg total) by mouth 3 (three) times daily. 21 tablet 0  . loratadine (CLARITIN) 10 MG tablet Take 1 tablet (10 mg total) by mouth daily. 30 tablet 11  . phentermine (ADIPEX-P) 37.5 MG tablet Take 1 tablet (37.5 mg total) by mouth daily before breakfast. 45 tablet 0  . ranitidine (ZANTAC) 300 MG tablet Take 1 tablet (300 mg total) by mouth at bedtime. 14 tablet 0  . temazepam (RESTORIL) 30 MG capsule Take 1 capsule (30 mg total) by mouth at bedtime as needed for sleep. 30 capsule 3   No current facility-administered medications on file prior to visit.     Social History   Social History  . Marital status: Married    Spouse name: N/A  . Number of children: 2  . Years of education: N/A   Occupational History  . Risk analyst     Social History Main Topics  . Smoking status: Former Smoker    Packs/day: 0.50    Years: 21.00    Types: Cigarettes    Quit date: 03/19/2015  . Smokeless tobacco: Never Used  . Alcohol use No  . Drug use: No  . Sexual activity: Yes    Birth control/ protection: Surgical  Comment: tubal   Other Topics Concern  . Not on file   Social History Narrative  . No narrative on file    Family History  Problem Relation Age of Onset  . Coronary artery disease Father        with CABG   . COPD Mother   . Diabetes Mother   . Hypertension Mother   . Hypertension Sister   . Cancer Sister 14       uterine  . Hypertension Sister   . Dementia Maternal Grandmother   . Cancer Maternal Grandmother        breast and  lung    BP 121/80   Pulse 80   Temp 98.5 F (36.9 C)   Ht 5\' 7"  (1.702 m)   Wt 193 lb (87.5 kg)   LMP 10/11/2015 (Approximate)   BMI 30.23 kg/m      Objective:   Physical Exam  Constitutional: She is oriented to person, place, and time. She appears well-developed and well-nourished.  HENT:  Head:  Normocephalic and atraumatic.  Eyes: Pupils are equal, round, and reactive to light. Conjunctivae and EOM are normal.  Neck: Normal range of motion. Neck supple.  Cardiovascular: Normal rate, regular rhythm and intact distal pulses.   Pulmonary/Chest: Effort normal.  Abdominal: Soft.  Musculoskeletal: Tenderness: Left knee tender, slight effusion, ROM 0 to 105 with pain, more pain laterally and posterioly, knee stable, NV intact.  Limp left.  Right knee negative.  Neurological: She is alert and oriented to person, place, and time. She displays normal reflexes. No cranial nerve deficit. She exhibits normal muscle tone. Coordination normal.  Skin: Skin is warm and dry.  Psychiatric: She has a normal mood and affect. Her behavior is normal. Judgment and thought content normal.  Vitals reviewed.         Assessment & Plan:   Encounter Diagnosis  Name Primary?  . Chronic pain of left knee Yes   I want her to go to see if she can qualify for [facility].  She needs a MRI of the knee on the left.  I will call in Naprosyn 500 po bid pc.  Precautions given.  Return in two weeks.  Call if any problem.  Precautions discussed.   Electronically Signed Sanjuana Kava, MD 8/28/20182:29 PM

## 2016-09-16 ENCOUNTER — Other Ambulatory Visit: Payer: Self-pay | Admitting: Family Medicine

## 2016-09-23 ENCOUNTER — Ambulatory Visit (INDEPENDENT_AMBULATORY_CARE_PROVIDER_SITE_OTHER): Payer: Self-pay | Admitting: Orthopaedic Surgery

## 2016-09-23 VITALS — BP 119/86 | HR 102 | Temp 98.8°F | Ht 67.0 in | Wt 194.0 lb

## 2016-09-23 DIAGNOSIS — M25562 Pain in left knee: Secondary | ICD-10-CM

## 2016-09-23 DIAGNOSIS — G8929 Other chronic pain: Secondary | ICD-10-CM

## 2016-09-23 NOTE — Progress Notes (Signed)
Patient Chelsey Lewis, female DOB:November 22, 1970, 46 y.o. GGE:[id number]  Chief Complaint  Patient presents with  . Follow-up    left knee pain    HPI  Chelsey Lewis is a 46 y.o. female who has had left knee pain and is much improved after taking the Naprosyn.  She has much less swelling and much less pain. She is walking better.  She plans to go to [facility] October 2nd. HPI  Body mass index is 30.38 kg/m.  ROS  Review of Systems  HENT: Negative for congestion.   Respiratory: Negative for cough and shortness of breath.   Cardiovascular: Negative for chest pain and leg swelling.  Endocrine: Negative for cold intolerance.  Musculoskeletal: Positive for arthralgias, gait problem and joint swelling.  Allergic/Immunologic: Negative for environmental allergies.  Neurological: Positive for headaches.    Past Medical History:  Diagnosis Date  . Anemia   . Constipation   . DUB (dysfunctional uterine bleeding) 05/05/2012  . Hot flashes 02/23/2015  . Hyperlipidemia   . Irregular periods/menstrual cycles 02/23/2015  . Migraine headache   . Overweight(278.02)   . Peri-menopausal 05/12/2012  . Peri-menopause 02/23/2015  . Postmenopausal 03/02/2015  . Tobacco abuse   . Tubal pregnancy   . Vaginal discharge   . Well adult exam     Past Surgical History:  Procedure Laterality Date  . 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 ECTOPIC PREGNANCY SURGERY    . oopherectomy and tubal ligation    . TUBAL LIGATION      Family History  Problem Relation Age of Onset  . Coronary artery disease Father        with CABG   . COPD Mother   . Diabetes Mother   . Hypertension Mother   . Hypertension Sister   . Cancer Sister 79       uterine  . Hypertension Sister   . Dementia Maternal Grandmother   . Cancer Maternal Grandmother        breast and  lung    Social History Social History  Substance Use Topics  . Smoking status: Former Smoker     Packs/day: 0.50    Years: 21.00    Types: Cigarettes    Quit date: 03/19/2015  . Smokeless tobacco: Never Used  . Alcohol use No    No Known Allergies  Current Outpatient Prescriptions  Medication Sig Dispense Refill  . amLODipine (NORVASC) 5 MG tablet TAKE ONE TABLET AT 9 PM EVERY NIGHT FOR BLOOD PRESSURE 30 tablet 3  . azelastine (ASTELIN) 0.1 % nasal spray Place 2 sprays into both nostrils 2 (two) times daily. Use in each nostril as directed 30 mL 12  . ibuprofen (ADVIL,MOTRIN) 800 MG tablet Take 1 tablet (800 mg total) by mouth 3 (three) times daily. 21 tablet 0  . loratadine (CLARITIN) 10 MG tablet Take 1 tablet (10 mg total) by mouth daily. 30 tablet 11  . naproxen (NAPROSYN) 500 MG tablet Take 1 tablet (500 mg total) by mouth 2 (two) times daily with a meal. 60 tablet 5  . phentermine (ADIPEX-P) 37.5 MG tablet Take 1 tablet (37.5 mg total) by mouth daily before breakfast. 45 tablet 0  . ranitidine (ZANTAC) 300 MG tablet Take 1 tablet (300 mg total) by mouth at bedtime. 14 tablet 0  . temazepam (RESTORIL) 30 MG capsule Take 1 capsule (30 mg total) by mouth at bedtime as needed for sleep. [place]  capsule 3   No current facility-administered medications for this visit.      Physical Exam  Blood pressure 119/86, pulse (!) 102, temperature 98.8 F (37.1 C), height 5\' 7"  (1.702 m), weight 194 lb (88 kg), last menstrual period 10/11/2015.  Constitutional: overall normal hygiene, normal nutrition, well developed, normal grooming, normal body habitus. Assistive device:none  Musculoskeletal: gait and station Limp none, muscle tone and strength are normal, no tremors or atrophy is present.  .  Neurological: coordination overall normal.  Deep tendon reflex/nerve stretch intact.  Sensation normal.  Cranial nerves II-XII intact.   Skin:   Normal overall no scars, lesions, ulcers or rashes. No psoriasis.  Psychiatric: Alert and oriented x 3.  Recent memory intact, remote memory unclear.   Normal mood and affect. Well groomed.  Good eye contact.  Cardiovascular: overall no swelling, no varicosities, no edema bilaterally, normal temperatures of the legs and arms, no clubbing, cyanosis and good capillary refill.  Lymphatic: palpation is normal.  Left knee has just very minimal tenderness. She has full motion, no swelling, no limp.  NV intact.  The patient has been educated about the nature of the problem(s) and counseled on treatment options.  The patient appeared to understand what I have discussed and is in agreement with it.  Encounter Diagnosis  Name Primary?  . Chronic pain of left knee Yes    PLAN Call if any problems.  Precautions discussed.  Continue current medications.   Return to clinic PRN   Electronically [place], MD 9/11/201811:24 AM

## 2016-10-13 ENCOUNTER — Ambulatory Visit: Payer: Self-pay | Admitting: Family Medicine

## 2016-10-20 ENCOUNTER — Other Ambulatory Visit: Payer: Self-pay

## 2016-10-20 MED ORDER — AMLODIPINE BESYLATE 5 MG PO TABS: ORAL_TABLET | ORAL | 3 refills | Status: DC

## 2016-11-26 ENCOUNTER — Telehealth: Payer: Self-pay | Admitting: *Deleted

## 2016-11-26 ENCOUNTER — Other Ambulatory Visit: Payer: Self-pay

## 2016-11-26 MED ORDER — TEMAZEPAM 30 MG PO CAPS: 30.0000 mg | ORAL_CAPSULE | Freq: Every evening | ORAL | 3 refills | Status: DC | PRN

## 2016-11-26 NOTE — Telephone Encounter (Signed)
Med sent.

## 2016-11-26 NOTE — Telephone Encounter (Signed)
Patient called requesting temazepam 30mg  to be refilled to [facility], per patient they never received the refill.

## 2016-11-27 ENCOUNTER — Ambulatory Visit: Payer: Self-pay | Admitting: Family Medicine

## 2016-12-17 ENCOUNTER — Encounter: Payer: Self-pay | Admitting: Family Medicine

## 2016-12-17 ENCOUNTER — Ambulatory Visit (INDEPENDENT_AMBULATORY_CARE_PROVIDER_SITE_OTHER): Payer: Self-pay | Admitting: Family Medicine

## 2016-12-17 VITALS — BP 120/70 | HR 100 | Resp 16 | Ht 67.0 in | Wt 200.0 lb

## 2016-12-17 DIAGNOSIS — E785 Hyperlipidemia, unspecified: Secondary | ICD-10-CM

## 2016-12-17 DIAGNOSIS — G8929 Other chronic pain: Secondary | ICD-10-CM

## 2016-12-17 DIAGNOSIS — E559 Vitamin D deficiency, unspecified: Secondary | ICD-10-CM

## 2016-12-17 DIAGNOSIS — I10 Essential (primary) hypertension: Secondary | ICD-10-CM

## 2016-12-17 DIAGNOSIS — F99 Mental disorder, not otherwise specified: Secondary | ICD-10-CM

## 2016-12-17 DIAGNOSIS — F5105 Insomnia due to other mental disorder: Secondary | ICD-10-CM

## 2016-12-17 DIAGNOSIS — M25562 Pain in left knee: Secondary | ICD-10-CM

## 2016-12-17 DIAGNOSIS — E8881 Metabolic syndrome: Secondary | ICD-10-CM

## 2016-12-17 DIAGNOSIS — E669 Obesity, unspecified: Secondary | ICD-10-CM

## 2016-12-17 LAB — BASIC METABOLIC PANEL
BUN: 10 mg/dL (ref 7–25)
CALCIUM: 11.2 mg/dL — AB (ref 8.6–10.2)
CO2: 30 mmol/L (ref 20–32)
Chloride: 104 mmol/L (ref 98–110)
Creat: 0.94 mg/dL (ref 0.50–1.10)
GLUCOSE: 95 mg/dL (ref 65–99)
Potassium: 4.5 mmol/L (ref 3.5–5.3)
Sodium: 139 mmol/L (ref 135–146)

## 2016-12-17 LAB — LIPID PANEL
CHOL/HDL RATIO: 4.2 (calc) (ref <5.0)
Cholesterol: 209 mg/dL — ABNORMAL HIGH (ref <200)
HDL: 50 mg/dL — ABNORMAL LOW (ref >50)
LDL Cholesterol (Calc): 139 mg/dL (calc) — ABNORMAL HIGH
NON-HDL CHOLESTEROL (CALC): 159 mg/dL — AB (ref <130)
TRIGLYCERIDES: 95 mg/dL (ref <150)

## 2016-12-17 LAB — TSH: TSH: 1.81 m[IU]/L

## 2016-12-17 MED ORDER — PHENTERMINE HCL 37.5 MG PO TABS: 37.5000 mg | ORAL_TABLET | Freq: Every day | ORAL | 1 refills | Status: DC

## 2016-12-17 NOTE — Patient Instructions (Addendum)
F/u in 4 month, call if you need me before  Half phentermine daily    Focus on food choice, water only and nothing after 7:30 pm Weight loss goal of 10 to 12 pounds Hope you get relief with left  knee and a job you can do in the interim sitting till you are better  Lipid, chem 7 and TSH today please  It is important that you exercise regularly at least 30 minutes 5 times a week. If you develop chest pain, have severe difficulty breathing, or feel very tired, stop exercising immediately and seek medical attention   Please work on good  health habits so that your health will improve. 1. Commitment to daily physical activity for 30 to 60  minutes, if you are able to do this.  2. Commitment to wise food choices. Aim for half of your  food intake to be vegetable and fruit, one quarter starchy foods, and one quarter protein. Try to eat on a regular schedule  3 meals per day, snacking between meals should be limited to vegetables or fruits or small portions of nuts. 64 ounces of water per day is generally recommended, unless you have specific health conditions, like heart failure or kidney failure where you will need to limit fluid intake.  3. Commitment to sufficient and a  good quality of physical and mental rest daily, generally between 6 to 8 hours per day.  WITH PERSISTANCE AND PERSEVERANCE, THE IMPOSSIBLE , BECOMES THE NORM!

## 2016-12-18 ENCOUNTER — Encounter: Payer: Self-pay | Admitting: Family Medicine

## 2016-12-21 ENCOUNTER — Encounter: Payer: Self-pay | Admitting: Family Medicine

## 2016-12-21 NOTE — Assessment & Plan Note (Signed)
Deteriorated. Patient re-educated about  the importance of commitment to a  minimum of 150 minutes of exercise per week.  The importance of healthy food choices with portion control discussed. Encouraged to start a food diary, count calories and to consider  joining a support group. Sample diet sheets offered. Goals set by the patient for the next several months.   Weight /BMI 12/17/2016 09/23/2016 09/09/2016  WEIGHT 200 lb 194 lb 193 lb  HEIGHT 5\' 7"  5\' 7"  5\' 7"   BMI 31.32 kg/m2 30.38 kg/m2 30.23 kg/m2   Resume hal phentermine and focus on food choice

## 2016-12-21 NOTE — Assessment & Plan Note (Signed)
Controlled, no change in medication DASH diet and commitment to daily physical activity for a minimum of 30 minutes discussed and encouraged, as a part of hypertension management. The importance of attaining a healthy weight is also discussed.  BP/Weight 12/17/2016 09/23/2016 09/09/2016 09/03/2016 06/25/2016 04/10/2016 09/13/5174  Systolic BP 160 737 106 269 485 462 703  Diastolic BP 70 86 80 80 82 80 92  Wt. (Lbs) 200 194 193 194 191 180 181  BMI 31.32 30.38 30.23 31.31 30.83 29.05 29.21

## 2016-12-21 NOTE — Assessment & Plan Note (Signed)
Ongoing disabling pain , needs ortho re evaluation. May need to look for alternate work which does not involve standing or walking for long periods

## 2016-12-21 NOTE — Assessment & Plan Note (Signed)
Sleep hygiene reviewed and written information offered also. Prescription sent for  medication needed.  

## 2016-12-21 NOTE — Assessment & Plan Note (Signed)
Hyperlipidemia:Low fat diet discussed and encouraged.   Lipid Panel  Lab Results  Component Value Date   CHOL 209 (H) 12/17/2016   HDL 50 (L) 12/17/2016   LDLCALC 140 (H) 03/25/2016   TRIG 95 12/17/2016   CHOLHDL 4.2 12/17/2016   Not at goal , needs to lower ft intake and increase physical activity

## 2016-12-21 NOTE — Progress Notes (Signed)
Chelsey Lewis     MRN: [id number]      DOB: 11-28-1970   HPI [name] is here for follow up and re-evaluation of chronic medical conditions, medication management and review of any available recent lab and radiology data.  Preventive health is updated, specifically  Cancer screening and Immunization.   Questions or concerns regarding consultations or procedures which the PT has had in the interim are  addressed. The PT denies any adverse reactions to current medications since the last visit.  C/o ongoing uncontrolled left knee pain, anxiety and depression about inability to work and concern about weight gain.   ROS Denies recent fever or chills. Denies sinus pressure, nasal congestion, ear pain or sore throat. Denies chest congestion, productive cough or wheezing. Denies chest pains, palpitations and leg swelling Denies abdominal pain, nausea, vomiting,diarrhea or constipation.   Denies dysuria, frequency, hesitancy or incontinence.  Denies headaches, seizures, numbness, or tingling.  Denies skin break down or rash.   PE  BP 120/70   Pulse 100   Resp 16   Ht 5\' 7"  (1.702 m)   Wt 200 lb (90.7 kg)   LMP 10/11/2015 (Approximate)   SpO2 96%   BMI 31.32 kg/m   Patient alert and oriented and in no cardiopulmonary distress.  HEENT: No facial asymmetry, EOMI,   oropharynx pink and moist.  Neck supple no JVD, no mass.  Chest: Clear to auscultation bilaterally.  CVS: S1, S2 no murmurs, no S3.Regular rate.  ABD: Soft non tender.   Ext: No edema  MS: Adequate ROM spine, shoulders, hips and reduced in left  Knee which is deformed.  Skin: Intact, no ulcerations or rash noted.  Psych: Good eye contact, flat l affect. Memory intact t anxious and  depressed appearing.  CNS: CN 2-12 intact, power,  normal throughout.no focal deficits noted.   Assessment & Plan  Essential hypertension Controlled, no change in medication DASH diet and commitment to daily physical activity for  a minimum of 30 minutes discussed and encouraged, as a part of hypertension management. The importance of attaining a healthy weight is also discussed.  BP/Weight 12/17/2016 09/23/2016 09/09/2016 09/03/2016 06/25/2016 04/10/2016 6/43/3295  Systolic BP 188 416 606 301 601 093 235  Diastolic BP 70 86 80 80 82 80 92  Wt. (Lbs) 200 194 193 194 191 180 181  BMI 31.32 30.38 30.23 31.31 30.83 29.05 29.21       Obesity (BMI 30.0-34.9) Deteriorated. Patient re-educated about  the importance of commitment to a  minimum of 150 minutes of exercise per week.  The importance of healthy food choices with portion control discussed. Encouraged to start a food diary, count calories and to consider  joining a support group. Sample diet sheets offered. Goals set by the patient for the next several months.   Weight /BMI 12/17/2016 09/23/2016 09/09/2016  WEIGHT 200 lb 194 lb 193 lb  HEIGHT 5\' 7"  5\' 7"  5\' 7"   BMI 31.32 kg/m2 30.38 kg/m2 30.23 kg/m2   Resume hal phentermine and focus on food choice   Chronic pain of left knee Ongoing disabling pain , needs ortho re evaluation. May need to look for alternate work which does not involve standing or walking for long periods  Dyslipidemia, goal LDL below 100 Hyperlipidemia:Low fat diet discussed and encouraged.   Lipid Panel  Lab Results  Component Value Date   CHOL 209 (H) 12/17/2016   HDL 50 (L) 12/17/2016   LDLCALC 140 (H) 03/25/2016   TRIG  95 12/17/2016   CHOLHDL 4.2 12/17/2016   Not at goal , needs to lower ft intake and increase physical activity    Insomnia Sleep hygiene reviewed and written information offered also. Prescription sent for  medication needed.

## 2016-12-25 ENCOUNTER — Encounter: Payer: Self-pay | Admitting: Orthopaedic Surgery

## 2016-12-25 ENCOUNTER — Ambulatory Visit: Payer: Self-pay | Admitting: Orthopaedic Surgery

## 2016-12-25 VITALS — BP 138/93 | HR 87 | Temp 97.8°F | Ht 68.0 in | Wt 203.0 lb

## 2016-12-25 DIAGNOSIS — M25562 Pain in left knee: Secondary | ICD-10-CM

## 2016-12-25 DIAGNOSIS — G8929 Other chronic pain: Secondary | ICD-10-CM

## 2016-12-25 NOTE — Progress Notes (Signed)
CC:  I have pain of my left knee. I would like an injection.  The patient has chronic pain of the left knee.  There is no recent trauma.  There is no redness.  Injections in the past have helped.  The knee has no redness, has an effusion and crepitus present.  ROM of the left knee is 0-110.  Impression:  Chronic knee pain left  Return: 1 month  PROCEDURE NOTE:  The patient requests injections of the left knee, verbal consent was obtained.  The left knee was prepped appropriately after time out was performed.   Sterile technique was observed and injection of 1 cc of Depo-Medrol 40 mg with several cc's of plain xylocaine. Anesthesia was provided by ethyl chloride and a 20-gauge needle was used to inject the knee area. The injection was tolerated well.  A band aid dressing was applied.  The patient was advised to apply ice later today and tomorrow to the injection sight as needed.  Resume her Naprosyn.  Electronically Signed Sanjuana Kava, MD 12/13/20189:24 AM

## 2016-12-25 NOTE — Patient Instructions (Signed)

## 2017-01-15 ENCOUNTER — Other Ambulatory Visit: Payer: Self-pay | Admitting: Family Medicine

## 2017-04-22 ENCOUNTER — Ambulatory Visit: Payer: Self-pay | Admitting: Family Medicine

## 2017-05-06 ENCOUNTER — Ambulatory Visit: Payer: Self-pay | Admitting: Family Medicine

## 2017-06-03 ENCOUNTER — Other Ambulatory Visit: Payer: Self-pay | Admitting: Family Medicine

## 2017-06-04 ENCOUNTER — Other Ambulatory Visit: Payer: Self-pay | Admitting: Family Medicine

## 2017-06-04 DIAGNOSIS — Z1231 Encounter for screening mammogram for malignant neoplasm of breast: Secondary | ICD-10-CM

## 2017-06-05 ENCOUNTER — Encounter (HOSPITAL_COMMUNITY): Payer: Self-pay

## 2017-06-05 ENCOUNTER — Ambulatory Visit (HOSPITAL_COMMUNITY)
Admission: RE | Admit: 2017-06-05 | Discharge: 2017-06-05 | Disposition: A | Discharge status: Home / Self Care | Payer: Medicaid Other | Source: Ambulatory Visit | Attending: Family Medicine | Admitting: Family Medicine

## 2017-06-05 DIAGNOSIS — Z1231 Encounter for screening mammogram for malignant neoplasm of breast: Secondary | ICD-10-CM | POA: Insufficient documentation

## 2017-06-10 ENCOUNTER — Other Ambulatory Visit: Payer: Self-pay | Admitting: Family Medicine

## 2017-06-16 ENCOUNTER — Ambulatory Visit (INDEPENDENT_AMBULATORY_CARE_PROVIDER_SITE_OTHER): Payer: Self-pay | Admitting: Family Medicine

## 2017-06-16 ENCOUNTER — Encounter: Payer: Self-pay | Admitting: Family Medicine

## 2017-06-16 VITALS — BP 118/80 | HR 98 | Resp 16 | Ht 68.0 in | Wt 205.0 lb

## 2017-06-16 DIAGNOSIS — F99 Mental disorder, not otherwise specified: Secondary | ICD-10-CM

## 2017-06-16 DIAGNOSIS — E66811 Obesity, class 1: Secondary | ICD-10-CM

## 2017-06-16 DIAGNOSIS — E049 Nontoxic goiter, unspecified: Secondary | ICD-10-CM

## 2017-06-16 DIAGNOSIS — I10 Essential (primary) hypertension: Secondary | ICD-10-CM

## 2017-06-16 DIAGNOSIS — J3089 Other allergic rhinitis: Secondary | ICD-10-CM

## 2017-06-16 DIAGNOSIS — E669 Obesity, unspecified: Secondary | ICD-10-CM

## 2017-06-16 DIAGNOSIS — F5105 Insomnia due to other mental disorder: Secondary | ICD-10-CM

## 2017-06-16 MED ORDER — PHENTERMINE HCL 37.5 MG PO TABS: 37.5000 mg | ORAL_TABLET | Freq: Every day | ORAL | 1 refills | Status: DC

## 2017-06-16 MED ORDER — TEMAZEPAM 30 MG PO CAPS: 30.0000 mg | ORAL_CAPSULE | Freq: Every day | ORAL | 5 refills | Status: DC

## 2017-06-16 MED ORDER — AMLODIPINE BESYLATE 5 MG PO TABS: ORAL_TABLET | ORAL | 5 refills | Status: DC

## 2017-06-16 NOTE — Assessment & Plan Note (Signed)
Evaluate with [place]

## 2017-06-16 NOTE — Patient Instructions (Signed)
F/U ion 4 months, call if you need me before  Weight loss goal of 3 pounds per month    Good that you stopped smoking 3 months ago  Fasting cBC, lipid, cmp and EGFR, Vit D 1 week before next visit  Half phentermine once daily  You are referred for US of your thyroid gland  It is important that you exercise regularly at least 30 minutes 5 times a week. If you develop chest pain, have severe difficulty breathing, or feel very tired, stop exercising immediately and seek medical attention    Please work on good  health habits so that your health will improve. 1. Commitment to daily physical activity for 30 to 60  minutes, if you are able to do this.  2. Commitment to wise food choices. Aim for half of your  food intake to be vegetable and fruit, one quarter starchy foods, and one quarter protein. Try to eat on a regular schedule  3 meals per day, snacking between meals should be limited to vegetables or fruits or small portions of nuts. 64 ounces of water per day is generally recommended, unless you have specific health conditions, like heart failure or kidney failure where you will need to limit fluid intake.  3. Commitment to sufficient and a  good quality of physical and mental rest daily, generally between 6 to 8 hours per day.  WITH PERSISTANCE AND PERSEVERANCE, THE IMPOSSIBLE , BECOMES THE NORM!   Thank you  for choosing [facility]. We consider it a privelige to serve you.  Delivering excellent health care in a caring and  compassionate way is our goal.  Partnering with you,  so that together we can achieve this goal is our strategy.    

## 2017-06-21 ENCOUNTER — Encounter: Payer: Self-pay | Admitting: Family Medicine

## 2017-06-21 NOTE — Assessment & Plan Note (Signed)
Controlled, no change in medication  

## 2017-06-21 NOTE — Progress Notes (Signed)
   Chelsey Lewis     MRN: [id number]      DOB: 1970/03/27   HPI Chelsey Lewis is here for follow up and re-evaluation of chronic medical conditions, medication management and review of any available recent lab and radiology data.  Has been adjusting to the fact that she is unable yo get back into the work environment at this time, went through a bout of depression, over ate and has gained weight, requests resuming phentermine to help with weight loss No longer depressed, thinking of re training ROS Denies recent fever or chills. Denies sinus pressure, nasal congestion, ear pain or sore throat. Denies chest congestion, productive cough or wheezing. Denies chest pains, palpitations and leg swelling Denies abdominal pain, nausea, vomiting,diarrhea or constipation.   Denies dysuria, frequency, hesitancy or incontinence. Denies joint pain, swelling and limitation in mobility. Denies headaches, seizures, numbness, or tingling. Denies depression, anxiety or insomnia. Denies skin break down or rash.   PE  BP 118/80   Pulse 98   Resp 16   Ht 5\' 8"  (1.727 m)   Wt 205 lb (93 kg)   LMP 10/11/2015 (Approximate)   SpO2 98%   BMI 31.17 kg/m   Patient alert and oriented and in no cardiopulmonary distress.  HEENT: No facial asymmetry, EOMI,   oropharynx pink and moist.  Neck supple no JVD, goiter  Chest: Clear to auscultation bilaterally.  CVS: S1, S2 no murmurs, no S3.Regular rate.  ABD: Soft non tender.   Ext: No edema  MS: Adequate ROM spine, shoulders, hips and knees.  Skin: Intact, no ulcerations or rash noted.  Psych: Good eye contact, normal affect. Memory intact not anxious or depressed appearing.  CNS: CN 2-12 intact, power,  normal throughout.no focal deficits noted.   Assessment & Plan  Goiter Evaluate with [place]  Essential hypertension Controlled, no change in medication DASH diet and commitment to daily physical activity for a minimum of 30 minutes discussed and  encouraged, as a part of hypertension management. The importance of attaining a healthy weight is also discussed.  BP/Weight 06/16/2017 12/25/2016 12/17/2016 09/23/2016 09/09/2016 09/03/2016 3/53/6144  Systolic BP 315 400 867 619 509 326 712  Diastolic BP 80 93 70 86 80 80 82  Wt. (Lbs) 205 203 200 194 193 194 191  BMI 31.17 30.87 31.32 30.38 30.23 31.31 30.83       Allergic rhinitis Controlled, no change in medication   Insomnia Sleep hygiene reviewed and written information offered also. Prescription sent for  medication needed.   Obesity (BMI 30.0-34.9) Deteriorated.Resume half phentermine daily Patient re-educated about  the importance of commitment to a  minimum of 150 minutes of exercise per week.  The importance of healthy food choices with portion control discussed. Encouraged to start a food diary, count calories and to consider  joining a support group. Sample diet sheets offered. Goals set by the patient for the next several months.   Weight /BMI 06/16/2017 12/25/2016 12/17/2016  WEIGHT 205 lb 203 lb 200 lb  HEIGHT 5\' 8"  5\' 8"  5\' 7"   BMI 31.17 kg/m2 30.87 kg/m2 31.32 kg/m2

## 2017-06-21 NOTE — Assessment & Plan Note (Signed)
Sleep hygiene reviewed and written information offered also. Prescription sent for  medication needed.  

## 2017-06-21 NOTE — Assessment & Plan Note (Addendum)
Deteriorated.Resume half phentermine daily Patient re-educated about  the importance of commitment to a  minimum of 150 minutes of exercise per week.  The importance of healthy food choices with portion control discussed. Encouraged to start a food diary, count calories and to consider  joining a support group. Sample diet sheets offered. Goals set by the patient for the next several months.   Weight /BMI 06/16/2017 12/25/2016 12/17/2016  WEIGHT 205 lb 203 lb 200 lb  HEIGHT 5\' 8"  5\' 8"  5\' 7"   BMI 31.17 kg/m2 30.87 kg/m2 31.32 kg/m2

## 2017-06-21 NOTE — Assessment & Plan Note (Signed)
Controlled, no change in medication DASH diet and commitment to daily physical activity for a minimum of 30 minutes discussed and encouraged, as a part of hypertension management. The importance of attaining a healthy weight is also discussed.  BP/Weight 06/16/2017 12/25/2016 12/17/2016 09/23/2016 09/09/2016 09/03/2016 3/88/8757  Systolic BP 972 820 601 561 537 943 276  Diastolic BP 80 93 70 86 80 80 82  Wt. (Lbs) 205 203 200 194 193 194 191  BMI 31.17 30.87 31.32 30.38 30.23 31.31 30.83

## 2017-06-25 ENCOUNTER — Ambulatory Visit (HOSPITAL_COMMUNITY)
Admission: RE | Admit: 2017-06-25 | Discharge: 2017-06-25 | Disposition: A | Discharge status: Home / Self Care | Payer: Self-pay | Source: Ambulatory Visit | Attending: Family Medicine | Admitting: Family Medicine

## 2017-06-25 DIAGNOSIS — E049 Nontoxic goiter, unspecified: Secondary | ICD-10-CM | POA: Insufficient documentation

## 2017-06-26 ENCOUNTER — Encounter: Payer: Self-pay | Admitting: Family Medicine

## 2017-09-29 ENCOUNTER — Encounter (HOSPITAL_COMMUNITY): Payer: Self-pay | Admitting: Physical Therapy

## 2017-10-19 ENCOUNTER — Ambulatory Visit: Payer: Self-pay | Admitting: Family Medicine

## 2017-11-12 ENCOUNTER — Encounter: Payer: Self-pay | Admitting: Family Medicine

## 2017-11-12 ENCOUNTER — Ambulatory Visit: Payer: Self-pay | Admitting: Family Medicine

## 2017-11-12 VITALS — BP 110/70 | HR 96 | Resp 12 | Ht 66.0 in | Wt 198.0 lb

## 2017-11-12 DIAGNOSIS — Z23 Encounter for immunization: Secondary | ICD-10-CM

## 2017-11-12 DIAGNOSIS — E8881 Metabolic syndrome: Secondary | ICD-10-CM

## 2017-11-12 DIAGNOSIS — E669 Obesity, unspecified: Secondary | ICD-10-CM

## 2017-11-12 DIAGNOSIS — I10 Essential (primary) hypertension: Secondary | ICD-10-CM

## 2017-11-12 DIAGNOSIS — G4709 Other insomnia: Secondary | ICD-10-CM

## 2017-11-12 MED ORDER — PHENTERMINE HCL 37.5 MG PO TABS: 37.5000 mg | ORAL_TABLET | Freq: Every day | ORAL | 1 refills | Status: DC

## 2017-11-12 NOTE — Patient Instructions (Addendum)
F/U in 4 months, call if you need me before  Flu vaccine today  Fasting chem 7 and EGFr, 1 week before next visit.  CONGRATS on quitting smoking and 8 pound weight loss, keep both up!  No medication changes  It is important that you exercise regularly at least 30 minutes 5 times a week. If you develop chest pain, have severe difficulty breathing, or feel very tired, stop exercising immediately and seek medical attention    Thank you  for choosing [facility]. We consider it a privelige to serve you.  Delivering excellent health care in a caring and  compassionate way is our goal.  Partnering with you,  so that together we can achieve this goal is our strategy.

## 2017-11-15 ENCOUNTER — Encounter: Payer: Self-pay | Admitting: Family Medicine

## 2017-11-15 NOTE — Assessment & Plan Note (Signed)
Sleep hygiene reviewed and written information offered also. Prescription sent for  medication needed.  

## 2017-11-15 NOTE — Assessment & Plan Note (Signed)
Controlled, no change in medication DASH diet and commitment to daily physical activity for a minimum of 30 minutes discussed and encouraged, as a part of hypertension management. The importance of attaining a healthy weight is also discussed.  BP/Weight 11/12/2017 06/16/2017 12/25/2016 12/17/2016 09/23/2016 09/09/2016 2/84/1324  Systolic BP 401 027 253 664 403 474 259  Diastolic BP 70 80 93 70 86 80 80  Wt. (Lbs) 198 205 203 200 194 193 194  BMI 31.96 31.17 30.87 31.32 30.38 30.23 31.31

## 2017-11-15 NOTE — Progress Notes (Signed)
   Chelsey Lewis     MRN: [id number]      DOB: 23-Feb-1970   HPI Chelsey Lewis is here for follow up and re-evaluation of chronic medical conditions, medication management and review of any available recent lab and radiology data.  Preventive health is updated, specifically  Cancer screening and Immunization.   Questions or concerns regarding consultations or procedures which the PT has had in the interim are  addressed. The PT denies any adverse reactions to current medications since the last visit.  There are no new concerns.  There are no specific complaints   ROS Denies recent fever or chills. Denies sinus pressure, nasal congestion, ear pain or sore throat. Denies chest congestion, productive cough or wheezing. Denies chest pains, palpitations and leg swelling Denies abdominal pain, nausea, vomiting,diarrhea or constipation.   Denies dysuria, frequency, hesitancy or incontinence. Denies joint pain, swelling and limitation in mobility. Denies headaches, seizures, numbness, or tingling. Denies depression, anxiety or insomnia. Denies skin break down or rash.   PE  BP 110/70 (BP Location: Left Arm, Patient Position: Sitting, Cuff Size: Normal)   Pulse 96   Resp 12 Comment: room air  Ht 5\' 6"  (1.676 m)   Wt 198 lb (89.8 kg)   LMP 10/11/2015 (Approximate)   SpO2 96% Comment: room air  BMI 31.96 kg/m   Patient alert and oriented and in no cardiopulmonary distress.  HEENT: No facial asymmetry, EOMI,   oropharynx pink and moist.  Neck supple no JVD, no mass.  Chest: Clear to auscultation bilaterally.  CVS: S1, S2 no murmurs, no S3.Regular rate.  ABD: Soft non tender.   Ext: No edema  MS: Adequate ROM spine, shoulders, hips and knees.  Skin: Intact, no ulcerations or rash noted.  Psych: Good eye contact, normal affect. Memory intact not anxious or depressed appearing.  CNS: CN 2-12 intact, power,  normal throughout.no focal deficits noted.   Assessment & Plan  Essential  hypertension Controlled, no change in medication DASH diet and commitment to daily physical activity for a minimum of 30 minutes discussed and encouraged, as a part of hypertension management. The importance of attaining a healthy weight is also discussed.  BP/Weight 11/12/2017 06/16/2017 12/25/2016 12/17/2016 09/23/2016 09/09/2016 7/41/4239  Systolic BP 532 023 343 568 616 837 290  Diastolic BP 70 80 93 70 86 80 80  Wt. (Lbs) 198 205 203 200 194 193 194  BMI 31.96 31.17 30.87 31.32 30.38 30.23 31.31       Obesity (BMI 30.0-34.9) Improved, continue half phentermine daily Patient re-educated about  the importance of commitment to a  minimum of 150 minutes of exercise per week.  The importance of healthy food choices with portion control discussed. Encouraged to start a food diary, count calories and to consider  joining a support group. Sample diet sheets offered. Goals set by the patient for the next several months.   Weight /BMI 11/12/2017 06/16/2017 12/25/2016  WEIGHT 198 lb 205 lb 203 lb  HEIGHT 5\' 6"  5\' 8"  5\' 8"   BMI 31.96 kg/m2 31.17 kg/m2 30.87 kg/m2      Insomnia Sleep hygiene reviewed and written information offered also. Prescription sent for  medication needed.

## 2017-11-15 NOTE — Assessment & Plan Note (Signed)
Improved, continue half phentermine daily Patient re-educated about  the importance of commitment to a  minimum of 150 minutes of exercise per week.  The importance of healthy food choices with portion control discussed. Encouraged to start a food diary, count calories and to consider  joining a support group. Sample diet sheets offered. Goals set by the patient for the next several months.   Weight /BMI 11/12/2017 06/16/2017 12/25/2016  WEIGHT 198 lb 205 lb 203 lb  HEIGHT 5\' 6"  5\' 8"  5\' 8"   BMI 31.96 kg/m2 31.17 kg/m2 30.87 kg/m2

## 2017-12-22 ENCOUNTER — Other Ambulatory Visit: Payer: Self-pay | Admitting: Family Medicine

## 2018-01-17 ENCOUNTER — Other Ambulatory Visit: Payer: Self-pay | Admitting: Family Medicine

## 2018-01-26 ENCOUNTER — Encounter: Payer: Self-pay | Admitting: Family Medicine

## 2018-03-10 ENCOUNTER — Ambulatory Visit (INDEPENDENT_AMBULATORY_CARE_PROVIDER_SITE_OTHER): Payer: Self-pay | Admitting: Adult Health

## 2018-03-10 ENCOUNTER — Other Ambulatory Visit (HOSPITAL_COMMUNITY)
Admission: RE | Admit: 2018-03-10 | Discharge: 2018-03-10 | Disposition: A | Discharge status: Home / Self Care | Payer: Self-pay | Source: Ambulatory Visit | Attending: Adult Health | Admitting: Adult Health

## 2018-03-10 ENCOUNTER — Encounter: Payer: Self-pay | Admitting: Adult Health

## 2018-03-10 ENCOUNTER — Other Ambulatory Visit: Payer: Self-pay

## 2018-03-10 VITALS — BP 128/87 | HR 82 | Ht 66.0 in | Wt 210.0 lb

## 2018-03-10 DIAGNOSIS — Z1211 Encounter for screening for malignant neoplasm of colon: Secondary | ICD-10-CM

## 2018-03-10 DIAGNOSIS — Z01419 Encounter for gynecological examination (general) (routine) without abnormal findings: Secondary | ICD-10-CM | POA: Insufficient documentation

## 2018-03-10 DIAGNOSIS — Z780 Asymptomatic menopausal state: Secondary | ICD-10-CM

## 2018-03-10 DIAGNOSIS — Z1212 Encounter for screening for malignant neoplasm of rectum: Secondary | ICD-10-CM

## 2018-03-10 LAB — HEMOCCULT GUIAC POC 1CARD (OFFICE): Fecal Occult Blood, POC: NEGATIVE

## 2018-03-10 NOTE — Progress Notes (Signed)
Patient ID: Chelsey Lewis, female   DOB: [birth year], 48 y.o.   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  PCP is Dr Moshe Cipro.   Current Medications, Allergies, Past Medical History, Past Surgical History, Family History and Social History were reviewed in [employer] record.     Review of Systems:  Patient denies any headaches, hearing loss, fatigue, blurred vision, shortness of breath, chest pain, abdominal pain, problems with bowel movements, urination, or intercourse. No joint pain or mood swings. Has some hot flashes and decreased libido.   Physical Exam:BP 128/87 (BP Location: Left Arm, Patient Position: Sitting, Cuff Size: Normal)   Pulse 82   Ht 5\' 6"  (1.676 m)   Wt 210 lb (95.3 kg)   LMP 10/11/2015 (Approximate)   BMI 33.89 kg/m  General:  Well developed, well nourished, no acute distress Skin:  Warm and dry Neck:  Midline trachea, normal thyroid, good ROM, no lymphadenopathy Lungs; Clear to auscultation bilaterally Breast:  No dominant palpable mass, retraction, or nipple discharge Cardiovascular: Regular rate and rhythm Abdomen:  Soft, non tender, no hepatosplenomegaly Pelvic:  External genitalia is normal in appearance, no lesions.  The vagina is normal in appearance. Urethra has no lesions or masses. The cervix is smooth, pap with HPV performed.  Uterus is felt to be normal size, shape, and contour.  No adnexal masses or tenderness noted.Bladder is non tender, no masses felt. Rectal: Good sphincter tone, no polyps, or hemorrhoids felt.  Hemoccult negative. Extremities/musculoskeletal:  No swelling or varicosities noted, no clubbing or cyanosis Psych:  No mood changes, alert and cooperative,seems happy Fall risk is low.pHQ 2 score  0. Examination chaperoned by Estill Bamberg Rash LPN. Discussed HRT, which she declines.  Impression: 1. Encounter for gynecological examination with Papanicolaou smear of cervix   2. Screening for colorectal cancer   3. Postmenopausal       Plan: Physical in 1 year Pap in 3 if normal Mammogram yearly Colonoscopy at 72 advised Labs with PCP

## 2018-03-11 ENCOUNTER — Ambulatory Visit: Payer: Self-pay | Admitting: Family Medicine

## 2018-03-12 LAB — CYTOLOGY - PAP
Adequacy: ABSENT
Diagnosis: NEGATIVE
HPV (WINDOPATH): NOT DETECTED

## 2018-04-01 ENCOUNTER — Other Ambulatory Visit: Payer: Self-pay

## 2018-04-01 ENCOUNTER — Encounter: Payer: Self-pay | Admitting: Family Medicine

## 2018-04-01 ENCOUNTER — Ambulatory Visit (INDEPENDENT_AMBULATORY_CARE_PROVIDER_SITE_OTHER): Payer: Self-pay | Admitting: Family Medicine

## 2018-04-01 VITALS — BP 116/76 | HR 81 | Resp 14 | Ht 66.0 in | Wt 208.0 lb

## 2018-04-01 DIAGNOSIS — F5104 Psychophysiologic insomnia: Secondary | ICD-10-CM

## 2018-04-01 DIAGNOSIS — F332 Major depressive disorder, recurrent severe without psychotic features: Secondary | ICD-10-CM

## 2018-04-01 DIAGNOSIS — I10 Essential (primary) hypertension: Secondary | ICD-10-CM

## 2018-04-01 DIAGNOSIS — E669 Obesity, unspecified: Secondary | ICD-10-CM

## 2018-04-01 MED ORDER — FLUOXETINE HCL 20 MG PO TABS: 20.0000 mg | ORAL_TABLET | Freq: Every day | ORAL | 5 refills | Status: DC

## 2018-04-01 NOTE — Patient Instructions (Addendum)
F/U in 3. 5  Months , call if you need me before   New for depression and anxiety is fluoxetine once daily,  Continue medications as before  Tylenol for right shoulder

## 2018-04-04 ENCOUNTER — Encounter: Payer: Self-pay | Admitting: Family Medicine

## 2018-04-04 DIAGNOSIS — F339 Major depressive disorder, recurrent, unspecified: Secondary | ICD-10-CM | POA: Insufficient documentation

## 2018-04-04 NOTE — Progress Notes (Signed)
SEREENA MARANDO     MRN: [id number]      DOB: 1970/08/28   HPI Chelsey Lewis is here for follow up and re-evaluation of chronic medical conditions, medication management and review of any available recent lab and radiology data.  Preventive health is updated, specifically  Cancer screening and Immunization.   C/o frustration , anxiety and depression as she has been unable to get employment , and now  With current health situation, she has again been put on hold for  A recent job offer. Has not filled phentermine and has lost no weight , admits to stress  Eating ROS Denies recent fever or chills. Denies sinus pressure, nasal congestion, ear pain or sore throat. Denies chest congestion, productive cough or wheezing. Denies chest pains, palpitations and leg swelling Denies abdominal pain, nausea, vomiting,diarrhea or constipation.   Denies dysuria, frequency, hesitancy or incontinence. C/o right shoulder pain and stiffness in past several weeks, no inciting trauma Denies headaches, seizures, numbness, or tingling. Denies skin break down or rash.   PE  BP 116/76   Pulse 81   Resp 14   Ht 5\' 6"  (1.676 m)   Wt 208 lb (94.3 kg)   LMP 10/11/2015 (Approximate)   SpO2 97%   BMI 33.57 kg/m   Patient alert and oriented and in no cardiopulmonary distress.  HEENT: No facial asymmetry, EOMI,   oropharynx pink and moist.  Neck supple no JVD, no mass.  Chest: Clear to auscultation bilaterally.  CVS: S1, S2 no murmurs, no S3.Regular rate.  ABD: Soft non tender.   Ext: No edema  MS: Adequate ROM spine, decreased ROM  and mildly tender right shoulder,normal in  hips and knees.  Skin: Intact, no ulcerations or rash noted.  Psych: Good eye contact, normal affect. Memory intact  anxious and mildly  depressed appearing.  CNS: CN 2-12 intact, power,  normal throughout.no focal deficits noted.   Assessment & Plan  Depression, major, recurrent ([place]) Start fluoxetine 20 mg daily, f/u in 10 weeks   Essential hypertension Controlled, no change in medication DASH diet and commitment to daily physical activity for a minimum of 30 minutes discussed and encouraged, as a part of hypertension management. The importance of attaining a healthy weight is also discussed.  BP/Weight 04/01/2018 03/10/2018 11/12/2017 06/16/2017 12/25/2016 12/17/2016 2/87/8676  Systolic BP 720 947 096 283 662 947 654  Diastolic BP 76 87 70 80 93 70 86  Wt. (Lbs) 208 210 198 205 203 200 194  BMI 33.57 33.89 31.96 31.17 30.87 31.32 30.38       Obesity (BMI 30.0-34.9) unchanged  Patient re-educated about  the importance of commitment to a  minimum of 150 minutes of exercise per week as able.  The importance of healthy food choices with portion control discussed, as well as eating regularly and within a 12 hour window most days. The need to choose "clean , green" food 50 to 75% of the time is discussed, as well as to make water the primary drink and set a goal of 64 ounces water daily.  Encouraged to start a food diary,  and to consider  joining a support group. Sample diet sheets offered. Goals set by the patient for the next several months.   Weight /BMI 04/01/2018 03/10/2018 11/12/2017  WEIGHT 208 lb 210 lb 198 lb  HEIGHT 5\' 6"  5\' 6"  5\' 6"   BMI 33.57 kg/m2 33.89 kg/m2 31.96 kg/m2      Insomnia Sleep hygiene reviewed and written  information offered also. Prescription sent for  medication needed.

## 2018-04-04 NOTE — Assessment & Plan Note (Signed)
Sleep hygiene reviewed and written information offered also. Prescription sent for  medication needed.  

## 2018-04-04 NOTE — Assessment & Plan Note (Signed)
unchanged  Patient re-educated about  the importance of commitment to a  minimum of 150 minutes of exercise per week as able.  The importance of healthy food choices with portion control discussed, as well as eating regularly and within a 12 hour window most days. The need to choose "clean , green" food 50 to 75% of the time is discussed, as well as to make water the primary drink and set a goal of 64 ounces water daily.  Encouraged to start a food diary,  and to consider  joining a support group. Sample diet sheets offered. Goals set by the patient for the next several months.   Weight /BMI 04/01/2018 03/10/2018 11/12/2017  WEIGHT 208 lb 210 lb 198 lb  HEIGHT 5\' 6"  5\' 6"  5\' 6"   BMI 33.57 kg/m2 33.89 kg/m2 31.96 kg/m2

## 2018-04-04 NOTE — Assessment & Plan Note (Signed)
Start fluoxetine 20 mg daily, f/u in 10 weeks

## 2018-04-04 NOTE — Assessment & Plan Note (Signed)
Controlled, no change in medication DASH diet and commitment to daily physical activity for a minimum of 30 minutes discussed and encouraged, as a part of hypertension management. The importance of attaining a healthy weight is also discussed.  BP/Weight 04/01/2018 03/10/2018 11/12/2017 06/16/2017 12/25/2016 12/17/2016 06/13/5613  Systolic BP 379 432 761 470 929 574 734  Diastolic BP 76 87 70 80 93 70 86  Wt. (Lbs) 208 210 198 205 203 200 194  BMI 33.57 33.89 31.96 31.17 30.87 31.32 30.38

## 2018-04-19 ENCOUNTER — Encounter: Payer: Self-pay | Admitting: Family Medicine

## 2018-04-20 ENCOUNTER — Other Ambulatory Visit: Payer: Self-pay | Admitting: Family Medicine

## 2018-04-20 MED ORDER — TEMAZEPAM 30 MG PO CAPS: 30.0000 mg | ORAL_CAPSULE | Freq: Every evening | ORAL | 5 refills | Status: DC | PRN

## 2018-05-31 ENCOUNTER — Telehealth: Payer: Self-pay

## 2018-05-31 MED ORDER — FLUOXETINE HCL 20 MG PO CAPS: 20.0000 mg | ORAL_CAPSULE | Freq: Every day | ORAL | 3 refills | Status: DC

## 2018-05-31 NOTE — Telephone Encounter (Signed)
Fluoxetine tablets not covered by insurance. Changed to capsules. Ok'ed by provider

## 2018-06-02 ENCOUNTER — Other Ambulatory Visit (HOSPITAL_COMMUNITY): Payer: Self-pay | Admitting: Family Medicine

## 2018-06-02 DIAGNOSIS — Z1231 Encounter for screening mammogram for malignant neoplasm of breast: Secondary | ICD-10-CM

## 2018-06-14 ENCOUNTER — Encounter: Payer: Self-pay | Admitting: Family Medicine

## 2018-06-15 ENCOUNTER — Telehealth: Payer: Self-pay

## 2018-06-15 DIAGNOSIS — E785 Hyperlipidemia, unspecified: Secondary | ICD-10-CM

## 2018-06-15 DIAGNOSIS — I10 Essential (primary) hypertension: Secondary | ICD-10-CM

## 2018-06-15 DIAGNOSIS — E8881 Metabolic syndrome: Secondary | ICD-10-CM

## 2018-06-15 NOTE — Telephone Encounter (Signed)
Lab order sent

## 2018-06-17 ENCOUNTER — Other Ambulatory Visit: Payer: Self-pay

## 2018-06-17 ENCOUNTER — Ambulatory Visit (HOSPITAL_COMMUNITY)
Admission: RE | Admit: 2018-06-17 | Discharge: 2018-06-17 | Disposition: A | Discharge status: Home / Self Care | Payer: BC Managed Care – PPO | Source: Ambulatory Visit | Attending: Family Medicine | Admitting: Family Medicine

## 2018-06-17 DIAGNOSIS — Z1231 Encounter for screening mammogram for malignant neoplasm of breast: Secondary | ICD-10-CM | POA: Diagnosis not present

## 2018-06-26 ENCOUNTER — Encounter: Payer: Self-pay | Admitting: Family Medicine

## 2018-06-26 LAB — CBC
HCT: 38.7 % (ref 35.0–45.0)
Hemoglobin: 12.9 g/dL (ref 11.7–15.5)
MCH: 29.5 pg (ref 27.0–33.0)
MCHC: 33.3 g/dL (ref 32.0–36.0)
MCV: 88.6 fL (ref 80.0–100.0)
MPV: 9.8 fL (ref 7.5–12.5)
Platelets: 355 10*3/uL (ref 140–400)
RBC: 4.37 10*6/uL (ref 3.80–5.10)
RDW: 13.3 % (ref 11.0–15.0)
WBC: 5.8 10*3/uL (ref 3.8–10.8)

## 2018-06-26 LAB — BASIC METABOLIC PANEL WITH GFR
BUN: 12 mg/dL (ref 7–25)
CO2: 26 mmol/L (ref 20–32)
Calcium: 10 mg/dL (ref 8.6–10.2)
Chloride: 108 mmol/L (ref 98–110)
Creat: 0.88 mg/dL (ref 0.50–1.10)
GFR, Est African American: 89 mL/min/{1.73_m2} (ref >=60)
GFR, Est Non African American: 77 mL/min/{1.73_m2} (ref >=60)
Glucose, Bld: 90 mg/dL (ref 65–99)
Potassium: 4 mmol/L (ref 3.5–5.3)
Sodium: 140 mmol/L (ref 135–146)

## 2018-06-26 LAB — TSH: TSH: 0.91 mIU/L

## 2018-06-26 LAB — LIPID PANEL
Cholesterol: 218 mg/dL — ABNORMAL HIGH (ref <200)
HDL: 39 mg/dL — ABNORMAL LOW (ref >=50)
LDL Cholesterol (Calc): 160 mg/dL (calc) — ABNORMAL HIGH
Non-HDL Cholesterol (Calc): 179 mg/dL (calc) — ABNORMAL HIGH (ref <130)
Total CHOL/HDL Ratio: 5.6 (calc) — ABNORMAL HIGH (ref <5.0)
Triglycerides: 86 mg/dL (ref <150)

## 2018-07-08 ENCOUNTER — Telehealth: Payer: BC Managed Care – PPO | Admitting: Family Medicine

## 2018-07-08 ENCOUNTER — Encounter: Payer: Self-pay | Admitting: Family Medicine

## 2018-07-08 ENCOUNTER — Other Ambulatory Visit: Payer: Self-pay

## 2018-07-08 VITALS — BP 124/69 | Ht 66.0 in | Wt 188.0 lb

## 2018-07-08 DIAGNOSIS — E8881 Metabolic syndrome: Secondary | ICD-10-CM

## 2018-07-08 DIAGNOSIS — E785 Hyperlipidemia, unspecified: Secondary | ICD-10-CM

## 2018-07-08 DIAGNOSIS — Z1211 Encounter for screening for malignant neoplasm of colon: Secondary | ICD-10-CM

## 2018-07-08 MED ORDER — ROSUVASTATIN CALCIUM 5 MG PO TABS: 5.0000 mg | ORAL_TABLET | Freq: Every day | ORAL | 3 refills | Status: DC

## 2018-07-08 MED ORDER — AMLODIPINE BESYLATE 5 MG PO TABS: ORAL_TABLET | ORAL | 1 refills | Status: DC

## 2018-07-08 MED ORDER — FLUOXETINE HCL 20 MG PO CAPS: 20.0000 mg | ORAL_CAPSULE | Freq: Every day | ORAL | 1 refills | Status: DC

## 2018-07-08 NOTE — Patient Instructions (Addendum)
Nurse visit for Shingrix #1 ,  either next week or the week of July 13, prefer sooner appointment if patient is  able  Nurse visit for Shingrix # 2 approximately 8 weeks after Shingrix #1  Fasting lipid and cmp and eGFR 1 week before follow up visit   MD follow up and with flu vaccine in 4 months, call if you need me sooner  Thankful that you feel much better ,and have had excellent weight loss success  Cholesterol  Is too high. Need to change food choice as far as butter, fried foods and cheese are concerned  New medication crestor 5 mg one daily is also prescribed  You are referred to [facility] for screeening colonoscopy       Preventing High Cholesterol Cholesterol is a waxy, fat-like substance that your body needs in small amounts. Your liver makes all the cholesterol that your body needs. Having high cholesterol (hypercholesterolemia) increases your risk for heart disease and stroke. Extra (excess) cholesterol comes from the food you eat, such as animal-based fat (saturated fat) from meat and some dairy products. High cholesterol can often be prevented with diet and lifestyle changes. If you already have high cholesterol, you can control it with diet and lifestyle changes, as well as medicine. What nutrition changes can be made?  Eat less saturated fat. Foods that contain saturated fat include red meat and some dairy products.  Avoid processed meats, like bacon and lunch meats.  Avoid trans fats, which are found in margarine and some baked goods.  Avoid foods and beverages that have added sugars.  Eat more fruits, vegetables, and whole grains.  Choose healthy sources of protein, such as fish, poultry, and nuts.  Choose healthy sources of fat, such as: ? Nuts. ? Vegetable oils, especially olive oil. ? Fish that have healthy fats (omega-3 fatty acids), such as mackerel or salmon. What lifestyle changes can be made?   Lose weight if you are overweight. Losing 5-10 lb  (2.3-4.5 kg) can help prevent or control high cholesterol and reduce your risk for diabetes and high blood pressure. Ask your health care provider to help you with a diet and exercise plan to safely lose weight.  Get enough exercise. Do at least 150 minutes of moderate-intensity exercise each week. ? You could do this in short exercise sessions several times a day, or you could do longer exercise sessions a few times a week. For example, you could take a brisk 10-minute walk or bike ride, 3 times a day, for 5 days a week.  Do not smoke. If you need help quitting, ask your health care provider.  Limit your alcohol intake. If you drink alcohol, limit alcohol intake to no more than 1 drink a day for nonpregnant women and 2 drinks a day for men. One drink equals 12 oz of beer, 5 oz of wine, or 1 oz of hard liquor. Why are these changes important?  If you have high cholesterol, deposits (plaques) may build up on the walls of your blood vessels. Plaques make the arteries narrower and stiffer, which can restrict or block blood flow and cause blood clots to form. This greatly increases your risk for heart attack and stroke. Making diet and lifestyle changes can reduce your risk for these life-threatening conditions. What can I do to lower my risk?  Manage your risk factors for high cholesterol. Talk with your health care provider about all of your risk factors and how to lower your risk.  Manage other conditions that you have, such as diabetes or high blood pressure (hypertension).  Have your cholesterol checked at regular intervals.  Keep all follow-up visits as told by your health care provider. This is important. How is this treated? In addition to diet and lifestyle changes, your health care provider may recommend medicines to help lower cholesterol, such as a medicine to reduce the amount of cholesterol made in your liver. You may need medicine if:  Diet and lifestyle changes do not lower your  cholesterol enough.  You have high cholesterol and other risk factors for heart disease or stroke. Take over-the-counter and prescription medicines only as told by your health care provider. Where to find more information  American Heart Association: ThisTune.com.pt.jsp  National Heart, Lung, and Blood Institute: FrenchToiletries.com.cy Summary  High cholesterol increases your risk for heart disease and stroke. By keeping your cholesterol level low, you can reduce your risk for these conditions.  Diet and lifestyle changes are the most important steps in preventing high cholesterol.  Work with your health care provider to manage your risk factors, and have your blood tested regularly. This information is not intended to replace advice given to you by your health care provider. Make sure you discuss any questions you have with your health care provider. Document Released: 01/14/2015 Document Revised: 09/08/2015 Document Reviewed: 09/08/2015 Elsevier Interactive Patient Education  2019 [employer].

## 2018-07-19 ENCOUNTER — Encounter: Payer: Self-pay | Admitting: Internal Medicine

## 2018-07-22 ENCOUNTER — Ambulatory Visit: Payer: Self-pay | Admitting: Family Medicine

## 2018-09-05 NOTE — Progress Notes (Signed)
Referring Provider:  Fayrene Helper, MD Primary Care Physician:  Fayrene Helper, MD Primary Gastroenterologist:  Dr. Gala Romney   Chief Complaint  Patient presents with  . Colonoscopy    never had tcs    HPI:   Chelsey Lewis is a 48 y.o. female presenting today at the request of Dr. Tula Nakayama to schedule patients screening colonoscopy. Past medical history significant for HTN, HLD, and depression.     Today she states she has no complaints. No abdominal pain. BM daily. No constipation or diarrhea. No blood in the stool or black stools. No heart burn or acid reflux. No nausea or vomiting. No dysphagia. Appetite is good. No unintentional weight loss.   No fever, chills, lightheadedness, dizziness, or feeling like she will pass out. No chest pain, heart palpitations, shortness of breath, or cough.   Smokes about 5-6 cigarettes a week.  No alcohol or drug use.   No family history of colon cancer or colon polyps.    Past Medical History:  Diagnosis Date  . Anemia   . Constipation   . DUB (dysfunctional uterine bleeding) 05/05/2012  . Hot flashes 02/23/2015  . Hyperlipidemia   . Hypertension   . Irregular periods/menstrual cycles 02/23/2015  . Migraine headache   . Overweight(278.02)   . Peri-menopausal 05/12/2012  . Peri-menopause 02/23/2015  . Postmenopausal 03/02/2015  . Tobacco abuse   . Tubal pregnancy   . Vaginal discharge   . Well adult exam     Past Surgical History:  Procedure Laterality Date  . 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 ECTOPIC PREGNANCY SURGERY    . oopherectomy and tubal ligation    . TUBAL LIGATION      Current Outpatient Medications  Medication Sig Dispense Refill  . amLODipine (NORVASC) 5 MG tablet TAKE 1 TABLET AT 9 PM EVERY NIGHT FOR BLOOD PRESSURE 90 tablet 1  . azelastine (ASTELIN) 0.1 % nasal spray Place 2 sprays into both nostrils 2 (two) times daily. Use in each nostril as  directed 30 mL 12  . FLUoxetine (PROZAC) 20 MG capsule Take 1 capsule (20 mg total) by mouth daily. 90 capsule 1  . loratadine (CLARITIN) 10 MG tablet Take 1 tablet (10 mg total) by mouth daily. (Patient taking differently: Take 10 mg by mouth daily as needed. ) 30 tablet 11  . rosuvastatin (CRESTOR) 5 MG tablet Take 1 tablet (5 mg total) by mouth daily. 90 tablet 3  . temazepam (RESTORIL) 30 MG capsule Take 1 capsule (30 mg total) by mouth at bedtime as needed for sleep. 30 capsule 5   No current facility-administered medications for this visit.     Allergies as of 09/06/2018  . (No Known Allergies)    Family History  Problem Relation Age of Onset  . Coronary artery disease Father        with CABG   . COPD Mother   . Diabetes Mother   . Hypertension Mother   . Hypertension Sister   . Cancer Sister 37       uterine  . Hypertension Sister   . Dementia Maternal Grandmother   . Cancer Maternal Grandmother        breast and  lung  . Colon cancer Neg Hx   . Colon polyps Neg Hx     Social History   Socioeconomic History  . Marital status: Married    Spouse name: Not  on file  . Number of children: 2  . Years of education: Not on file  . Highest education level: Not on file  Occupational History  . Occupation: Insurance account manager  . Financial resource strain: Not on file  . Food insecurity    Worry: Not on file    Inability: Not on file  . Transportation needs    Medical: Not on file    Non-medical: Not on file  Tobacco Use  . Smoking status: Current Some Day Smoker    Packs/day: 0.50    Years: 21.00    Pack years: 10.50    Types: Cigarettes    Last attempt to quit: 07/12/2017    Years since quitting: 1.1  . Smokeless tobacco: Never Used  Substance and Sexual Activity  . Alcohol use: No    Alcohol/week: 0.0 standard drinks  . Drug use: No  . Sexual activity: Yes    Birth control/protection: Surgical    Comment: tubal  Lifestyle  . Physical activity     Days per week: Not on file    Minutes per session: Not on file  . Stress: Not on file  Relationships  . Social Herbalist on phone: Not on file    Gets together: Not on file    Attends religious service: Not on file    Active member of club or organization: Not on file    Attends meetings of clubs or organizations: Not on file    Relationship status: Not on file  . Intimate partner violence    Fear of current or ex partner: Not on file    Emotionally abused: Not on file    Physically abused: Not on file    Forced sexual activity: Not on file  Other Topics Concern  . Not on file  Social History Narrative  . Not on file    Review of Systems: Gen: See HPI  CV: Denies peripheral edema.  Resp: See HPI GI: See HPI GU : Denies urinary burning, urinary frequency, urinary hesitancy MS: Denies joint pain, muscle weakness Derm: Denies rash, itching, dry skin Psych: Some anxiety. Medications working well.   Heme: Denies bruising, bleeding.  Physical Exam: BP 129/88   Pulse 81   Temp (!) 96.9 F (36.1 C) (Temporal)   Ht 5\' 5"  (1.651 m)   Wt 185 lb 6.4 oz (84.1 kg)   LMP 10/11/2015 (Approximate)   BMI 30.85 kg/m  General:   Alert and oriented. Pleasant and cooperative. Well-nourished and well-developed.  Head:  Normocephalic and atraumatic. Eyes:  Without icterus, sclera clear and conjunctiva pink.  Ears:  Normal auditory acuity. Nose:  No deformity, discharge,  or lesions. Mouth:  No deformity or lesions, oral mucosa pink.  Lungs:  Clear to auscultation bilaterally. No wheezes, rales, or rhonchi. No distress.  Heart:  S1, S2 present without murmurs appreciated.  Abdomen:  +BS, soft, non-tender and non-distended. No HSM noted. No guarding or rebound. No masses appreciated.  Rectal:  Deferred  Msk:  Symmetrical without gross deformities. Normal posture. Extremities:  Without edema. Neurologic:  Alert and  oriented x4;  grossly normal neurologically. Skin:   Intact without significant lesions or rashes. Psych: Normal mood and affect.

## 2018-09-05 NOTE — Patient Instructions (Signed)
We will get you scheduled for a colonoscopy in the near future with Dr. Gala Romney.   We will see you back after your procedure.   Aliene Altes, PA-C [facility]

## 2018-09-05 NOTE — Assessment & Plan Note (Addendum)
48 y.o. female presenting to schedule her first ever screening colonoscopy. No upper or lower GI symptoms. No alarm symptoms. No family history of colon cancer or colon polyps.   Proceed with TCS with propofol with Dr. Gala Romney in the near future. The risks, benefits, and alternatives have been discussed in detail with patient. They have stated understanding and desire to proceed.  Follow-up after procedure.

## 2018-09-06 ENCOUNTER — Encounter: Payer: Self-pay | Admitting: Gastroenterology

## 2018-09-06 ENCOUNTER — Ambulatory Visit: Payer: BC Managed Care – PPO | Admitting: Gastroenterology

## 2018-09-06 ENCOUNTER — Other Ambulatory Visit: Payer: Self-pay

## 2018-09-06 VITALS — BP 129/88 | HR 81 | Temp 96.9°F | Ht 65.0 in | Wt 185.4 lb

## 2018-09-06 DIAGNOSIS — Z1211 Encounter for screening for malignant neoplasm of colon: Secondary | ICD-10-CM

## 2018-09-06 NOTE — Progress Notes (Signed)
cc'ed to pcp °

## 2018-09-13 ENCOUNTER — Telehealth: Payer: Self-pay | Admitting: *Deleted

## 2018-09-13 NOTE — Telephone Encounter (Signed)
Pt returned a call.  Would like you to call back at your convenience.  [phone]

## 2018-09-13 NOTE — Telephone Encounter (Signed)
LMOVM to schedule TCS with propofol with RMR 

## 2018-09-13 NOTE — Telephone Encounter (Signed)
LMOVM for pt 

## 2018-09-14 NOTE — Telephone Encounter (Signed)
LMOVM for pt 

## 2018-09-14 NOTE — Telephone Encounter (Signed)
Had VM from pt. Called pt back-LMTCB

## 2018-09-14 NOTE — Telephone Encounter (Signed)
Pt returned call to schedule appointment. She is at work and her next break is at 12:00 noon and 3:00 pm. She can be reached at [phone].

## 2018-09-15 ENCOUNTER — Telehealth: Payer: Self-pay | Admitting: Internal Medicine

## 2018-09-15 NOTE — Telephone Encounter (Signed)
[phone] PATIENT RETURNED CALL TO SCHEDULE TCS, STATED SHE IS OFF ON Friday AND TO PLEASE CALL HER THEN

## 2018-09-15 NOTE — Telephone Encounter (Signed)
Letter mailed

## 2018-09-21 ENCOUNTER — Encounter: Payer: Self-pay | Admitting: *Deleted

## 2018-09-21 ENCOUNTER — Other Ambulatory Visit: Payer: Self-pay | Admitting: *Deleted

## 2018-09-21 ENCOUNTER — Other Ambulatory Visit: Payer: Self-pay

## 2018-09-21 ENCOUNTER — Ambulatory Visit (INDEPENDENT_AMBULATORY_CARE_PROVIDER_SITE_OTHER): Payer: BC Managed Care – PPO

## 2018-09-21 DIAGNOSIS — Z23 Encounter for immunization: Secondary | ICD-10-CM

## 2018-09-21 DIAGNOSIS — Z1211 Encounter for screening for malignant neoplasm of colon: Secondary | ICD-10-CM

## 2018-09-21 MED ORDER — PEG 3350-KCL-NA BICARB-NACL 420 G PO SOLR: 4000.0000 mL | Freq: Once | ORAL | 0 refills | Status: AC

## 2018-09-21 NOTE — Progress Notes (Signed)
Patient came in today for her shingles vaccine. Given in left deltoid. No immediate reaction. Told patient to call if she had any issues.

## 2018-09-21 NOTE — Telephone Encounter (Signed)
Patient called back. She is scheduled for 11/9 at 7:30am. Patient aware will mail prep instructions with pre-op/covid-19 testing appt. Confirmed address. Rx sent to pharmacy. Orders entered.

## 2018-09-21 NOTE — Telephone Encounter (Signed)
Checked [employer] for PA. Received message "The code/description entered is not listed on the authorization table therefore there is no medical policy/criteria or preservice requirement at this time."

## 2018-09-21 NOTE — Telephone Encounter (Signed)
LMOVM letter mailed 

## 2018-09-22 ENCOUNTER — Encounter: Payer: Self-pay | Admitting: *Deleted

## 2018-10-16 ENCOUNTER — Encounter: Payer: Self-pay | Admitting: Family Medicine

## 2018-10-21 ENCOUNTER — Other Ambulatory Visit (HOSPITAL_COMMUNITY)
Admission: RE | Admit: 2018-10-21 | Discharge: 2018-10-21 | Disposition: A | Discharge status: Home / Self Care | Payer: BC Managed Care – PPO | Source: Other Acute Inpatient Hospital | Attending: Family Medicine | Admitting: Family Medicine

## 2018-10-21 ENCOUNTER — Encounter: Payer: Self-pay | Admitting: Family Medicine

## 2018-10-21 ENCOUNTER — Ambulatory Visit: Payer: BC Managed Care – PPO | Admitting: Family Medicine

## 2018-10-21 ENCOUNTER — Other Ambulatory Visit: Payer: Self-pay

## 2018-10-21 VITALS — BP 128/66 | HR 72 | Temp 98.8°F | Resp 15 | Ht 65.0 in | Wt 183.0 lb

## 2018-10-21 DIAGNOSIS — Z23 Encounter for immunization: Secondary | ICD-10-CM

## 2018-10-21 DIAGNOSIS — R319 Hematuria, unspecified: Secondary | ICD-10-CM | POA: Insufficient documentation

## 2018-10-21 LAB — POCT URINALYSIS DIP (CLINITEK)
Bilirubin, UA: NEGATIVE
Glucose, UA: NEGATIVE mg/dL
Ketones, POC UA: NEGATIVE mg/dL
Leukocytes, UA: NEGATIVE
Nitrite, UA: NEGATIVE
POC PROTEIN,UA: NEGATIVE
Spec Grav, UA: 1.025 (ref 1.010–1.025)
Urobilinogen, UA: 0.2 E.U./dL
pH, UA: 6 (ref 5.0–8.0)

## 2018-10-21 NOTE — Patient Instructions (Signed)
    I appreciate the opportunity to provide you with the care for your health and wellness. Today we discussed: blood in urine   Follow up: 11/10/2018  No labs or referrals today  Continue AZO if needed. Lemon fresh in water to help flush kidney and any stones trying to form. If noticed again, try to see if from vaginal area versus urethra.  Will send out culture and up date if need medication.   Please continue to practice social distancing to keep you, your family, and our community safe.  If you must go out, please wear a mask and practice good handwashing.  GET FRESH AIR DAILY. STAY HYDRATED WITH WATER.   It was a pleasure to see you and I look forward to continuing to work together on your health and well-being. Please do not hesitate to call the office if you need care or have questions about your care.  Have a wonderful day and week. With Gratitude, Cherly Beach, DNP, AGNP-BC

## 2018-10-21 NOTE — Progress Notes (Signed)
Subjective:     Patient ID: Chelsey Lewis, female   DOB: [birth year], 48 y.o.   MRN: [id number]  Chelsey Lewis presents for Hematuria  Ms. Gerhart is a 48 year old female patient of Dr. Griffin Dakin who presents today after seeing some pink-tinged coloring on toilet paper on October 2.  Reports that this is the only time that it happened.  Reports last her menstrual cycle was about a year ago.  She was told that she was perimenopausal.  Last Pap smear was in February 2020 and that was negative for malignancy.  She denies having any excessive spotting any noted vision or clotting or frank blood in urine or on toilet paper afterwards.  Did have intercourse post having the pink tinge colored on toilet paper and did not have any spotting or bleeding after that.  Nor did she have pain with intercourse.  She denies having nausea and vomiting, back pain, pelvic pain or pressure, cramps, burning, urgency, frequency, history of kidney stones or family history of kidney stones, drinks only about 4 cups of water daily.  Did start taking AZO just in case this was a urinary tract infection coming on.  Denies having any other signs of symptoms at this time.   Today patient denies signs and symptoms of COVID 19 infection including fever, chills, cough, shortness of breath, and headache.  Past Medical, Surgical, Social History, Allergies, and Medications have been Reviewed.   Past Medical History:  Diagnosis Date  . Anemia   . Constipation   . DUB (dysfunctional uterine bleeding) 05/05/2012  . Hot flashes 02/23/2015  . Hyperlipidemia   . Hypertension   . Irregular periods/menstrual cycles 02/23/2015  . Migraine headache   . Overweight(278.02)   . Peri-menopausal 05/12/2012  . Peri-menopause 02/23/2015  . Postmenopausal 03/02/2015  . Tobacco abuse   . Tubal pregnancy   . Vaginal discharge   . Well adult exam    Past Surgical History:  Procedure Laterality Date  . 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 ECTOPIC PREGNANCY SURGERY    . oopherectomy and tubal ligation    . TUBAL LIGATION     Social History   Socioeconomic History  . Marital status: Married    Spouse name: Not on file  . Number of children: 2  . Years of education: Not on file  . Highest education level: Not on file  Occupational History  . Occupation: Insurance account manager  . Financial resource strain: Not on file  . Food insecurity    Worry: Not on file    Inability: Not on file  . Transportation needs    Medical: Not on file    Non-medical: Not on file  Tobacco Use  . Smoking status: Current Some Day Smoker    Packs/day: 0.50    Years: 21.00    Pack years: 10.50    Types: Cigarettes    Last attempt to quit: 07/12/2017    Years since quitting: 1.2  . Smokeless tobacco: Never Used  Substance and Sexual Activity  . Alcohol use: No    Alcohol/week: 0.0 standard drinks  . Drug use: No  . Sexual activity: Yes    Birth control/protection: Surgical    Comment: tubal  Lifestyle  . Physical activity    Days per week: Not on file    Minutes per session: Not on file  . Stress: Not on file  Relationships  . Social Herbalist on phone: Not on file    Gets together: Not on file    Attends religious service: Not on file    Active member of club or organization: Not on file    Attends meetings of clubs or organizations: Not on file    Relationship status: Not on file  . Intimate partner violence    Fear of current or ex partner: Not on file    Emotionally abused: Not on file    Physically abused: Not on file    Forced sexual activity: Not on file  Other Topics Concern  . Not on file  Social History Narrative  . Not on file    Outpatient Encounter Medications as of 10/21/2018  Medication Sig  . amLODipine (NORVASC) 5 MG tablet TAKE 1 TABLET AT 9 PM EVERY NIGHT FOR BLOOD PRESSURE  . azelastine (ASTELIN) 0.1 % nasal spray Place 2 sprays into  both nostrils 2 (two) times daily. Use in each nostril as directed  . FLUoxetine (PROZAC) 20 MG capsule Take 1 capsule (20 mg total) by mouth daily.  Marland Kitchen loratadine (CLARITIN) 10 MG tablet Take 1 tablet (10 mg total) by mouth daily. (Patient taking differently: Take 10 mg by mouth daily as needed. )  . rosuvastatin (CRESTOR) 5 MG tablet Take 1 tablet (5 mg total) by mouth daily.  . temazepam (RESTORIL) 30 MG capsule Take 1 capsule (30 mg total) by mouth at bedtime as needed for sleep.   No facility-administered encounter medications on file as of 10/21/2018.    No Known Allergies  Review of Systems  Constitutional: Negative for fever.  HENT: Negative.   Eyes: Negative.   Respiratory: Negative.   Cardiovascular: Negative.   Gastrointestinal: Negative.   Endocrine: Negative.   Genitourinary: Positive for hematuria.  Musculoskeletal: Negative.   Skin: Negative.   Allergic/Immunologic: Negative.   Neurological: Negative.   Hematological: Negative.   Psychiatric/Behavioral: Negative.   All other systems reviewed and are negative.      Objective:     BP 128/66   Pulse 72   Temp 98.8 F (37.1 C) (Oral)   Resp 15   Ht 5\' 5"  (1.651 m)   Wt 183 lb 0.6 oz (83 kg)   LMP 10/11/2015 (Approximate)   SpO2 95%   BMI 30.46 kg/m   Physical Exam Vitals signs and nursing note reviewed.  Constitutional:      Appearance: Normal appearance. She is well-developed and well-groomed. She is obese.  HENT:     Head: Normocephalic and atraumatic.     Right Ear: External ear normal.     Left Ear: External ear normal.     Nose: Nose normal.     Mouth/Throat:     Mouth: Mucous membranes are moist.     Pharynx: Oropharynx is clear.  Eyes:     General:        Right eye: No discharge.        Left eye: No discharge.     Conjunctiva/sclera: Conjunctivae normal.  Neck:     Musculoskeletal: Normal range of motion and neck supple.  Cardiovascular:     Rate and Rhythm: Normal rate and regular  rhythm.     Pulses: Normal pulses.     Heart sounds: Normal heart sounds.  Pulmonary:     Effort: Pulmonary effort is normal.     Breath sounds: Normal breath sounds.  Abdominal:     General: Bowel sounds are  normal. There is no distension.     Palpations: Abdomen is soft. There is no mass.     Tenderness: There is no abdominal tenderness. There is no right CVA tenderness, left CVA tenderness, guarding or rebound.     Hernia: No hernia is present.  Musculoskeletal: Normal range of motion.  Skin:    General: Skin is warm.  Neurological:     General: No focal deficit present.     Mental Status: She is alert and oriented to person, place, and time.  Psychiatric:        Attention and Perception: Attention normal.        Mood and Affect: Mood normal.        Speech: Speech normal.        Behavior: Behavior normal. Behavior is cooperative.        Thought Content: Thought content normal.        Cognition and Memory: Cognition normal.        Judgment: Judgment normal.        Assessment and Plan        1. Hematuria, unspecified type Some noted trace amounts of hematuria on dip.  Possibly oxidized.  Will be sending out for culture just in case this is the start of a urinary tract infection.  But she denies having any other signs of symptoms at this time.  Given the normal Pap and the almost 12 months between her last period and cycle to now.  Will be looking to classify her as menopausal.  - POCT URINALYSIS DIP (CLINITEK) - Urine Culture  2. Need for immunization against influenza Patient was educated on the recommendation for flu vaccine. After obtaining informed consent, the vaccine was administered no adverse effects noted at time of administration. Patient provided with education on arm soreness and use of tylenol or ibuprofen (if safe) for this. Encourage to use the arm vaccine was given in to help reduce the soreness. Patient educated on the signs of a reaction to the vaccine and  advised to contact the office should these occur.   - Flu Vaccine QUAD 36+ mos IM  Follow-up: As previously scheduled on October 28.  Perlie Mayo, DNP, AGNP-BC [place], [place], [place] [address] Office Hours: Mon-Thurs 8 am-5 pm; Fri 8 am-12 pm Office Phone:  [phone]  Office Fax: [phone]

## 2018-10-22 LAB — URINE CULTURE: Culture: <10000 — AB

## 2018-11-04 ENCOUNTER — Other Ambulatory Visit: Payer: Self-pay | Admitting: Family Medicine

## 2018-11-05 ENCOUNTER — Encounter: Payer: Self-pay | Admitting: Family Medicine

## 2018-11-08 ENCOUNTER — Encounter: Payer: Self-pay | Admitting: Family Medicine

## 2018-11-10 ENCOUNTER — Ambulatory Visit: Payer: BC Managed Care – PPO | Admitting: Family Medicine

## 2018-11-18 ENCOUNTER — Other Ambulatory Visit: Payer: Self-pay

## 2018-11-18 ENCOUNTER — Encounter (HOSPITAL_COMMUNITY)
Admission: RE | Admit: 2018-11-18 | Discharge: 2018-11-18 | Disposition: A | Discharge status: Home / Self Care | Payer: BC Managed Care – PPO | Source: Ambulatory Visit | Attending: Internal Medicine | Admitting: Internal Medicine

## 2018-11-18 ENCOUNTER — Other Ambulatory Visit (HOSPITAL_COMMUNITY)
Admission: RE | Admit: 2018-11-18 | Discharge: 2018-11-18 | Disposition: A | Discharge status: Home / Self Care | Payer: BC Managed Care – PPO | Source: Ambulatory Visit | Attending: Internal Medicine | Admitting: Internal Medicine

## 2018-11-18 ENCOUNTER — Encounter (HOSPITAL_COMMUNITY): Payer: Self-pay

## 2018-11-18 DIAGNOSIS — Z01812 Encounter for preprocedural laboratory examination: Secondary | ICD-10-CM | POA: Diagnosis not present

## 2018-11-18 DIAGNOSIS — Z20828 Contact with and (suspected) exposure to other viral communicable diseases: Secondary | ICD-10-CM | POA: Insufficient documentation

## 2018-11-18 LAB — SARS CORONAVIRUS 2 (TAT 6-24 HRS): SARS Coronavirus 2: NEGATIVE

## 2018-11-22 ENCOUNTER — Ambulatory Visit (HOSPITAL_COMMUNITY): Payer: BC Managed Care – PPO | Admitting: Anesthesiology

## 2018-11-22 ENCOUNTER — Encounter (HOSPITAL_COMMUNITY): Payer: Self-pay

## 2018-11-22 ENCOUNTER — Encounter (HOSPITAL_COMMUNITY)
Admission: RE | Disposition: A | Discharge status: Home / Self Care | Payer: Self-pay | Source: Home / Self Care | Attending: Internal Medicine

## 2018-11-22 ENCOUNTER — Other Ambulatory Visit: Payer: Self-pay

## 2018-11-22 ENCOUNTER — Ambulatory Visit (HOSPITAL_COMMUNITY)
Admission: RE | Admit: 2018-11-22 | Discharge: 2018-11-22 | Disposition: A | Discharge status: Home / Self Care | Payer: BC Managed Care – PPO | Attending: Internal Medicine | Admitting: Internal Medicine

## 2018-11-22 DIAGNOSIS — F17210 Nicotine dependence, cigarettes, uncomplicated: Secondary | ICD-10-CM | POA: Insufficient documentation

## 2018-11-22 DIAGNOSIS — F329 Major depressive disorder, single episode, unspecified: Secondary | ICD-10-CM | POA: Insufficient documentation

## 2018-11-22 DIAGNOSIS — Z820 Family history of epilepsy and other diseases of the nervous system: Secondary | ICD-10-CM | POA: Insufficient documentation

## 2018-11-22 DIAGNOSIS — Z833 Family history of diabetes mellitus: Secondary | ICD-10-CM | POA: Diagnosis not present

## 2018-11-22 DIAGNOSIS — D649 Anemia, unspecified: Secondary | ICD-10-CM | POA: Diagnosis not present

## 2018-11-22 DIAGNOSIS — K635 Polyp of colon: Secondary | ICD-10-CM | POA: Diagnosis not present

## 2018-11-22 DIAGNOSIS — Z825 Family history of asthma and other chronic lower respiratory diseases: Secondary | ICD-10-CM | POA: Diagnosis not present

## 2018-11-22 DIAGNOSIS — E785 Hyperlipidemia, unspecified: Secondary | ICD-10-CM | POA: Insufficient documentation

## 2018-11-22 DIAGNOSIS — Z8049 Family history of malignant neoplasm of other genital organs: Secondary | ICD-10-CM | POA: Insufficient documentation

## 2018-11-22 DIAGNOSIS — Z8249 Family history of ischemic heart disease and other diseases of the circulatory system: Secondary | ICD-10-CM | POA: Diagnosis not present

## 2018-11-22 DIAGNOSIS — K5730 Diverticulosis of large intestine without perforation or abscess without bleeding: Secondary | ICD-10-CM | POA: Diagnosis not present

## 2018-11-22 DIAGNOSIS — I10 Essential (primary) hypertension: Secondary | ICD-10-CM | POA: Diagnosis not present

## 2018-11-22 DIAGNOSIS — Z801 Family history of malignant neoplasm of trachea, bronchus and lung: Secondary | ICD-10-CM | POA: Insufficient documentation

## 2018-11-22 DIAGNOSIS — Z79899 Other long term (current) drug therapy: Secondary | ICD-10-CM | POA: Diagnosis not present

## 2018-11-22 DIAGNOSIS — E669 Obesity, unspecified: Secondary | ICD-10-CM | POA: Insufficient documentation

## 2018-11-22 DIAGNOSIS — D124 Benign neoplasm of descending colon: Secondary | ICD-10-CM | POA: Diagnosis not present

## 2018-11-22 DIAGNOSIS — Z89021 Acquired absence of right finger(s): Secondary | ICD-10-CM | POA: Insufficient documentation

## 2018-11-22 DIAGNOSIS — G43909 Migraine, unspecified, not intractable, without status migrainosus: Secondary | ICD-10-CM | POA: Diagnosis not present

## 2018-11-22 DIAGNOSIS — Z1211 Encounter for screening for malignant neoplasm of colon: Secondary | ICD-10-CM | POA: Diagnosis present

## 2018-11-22 HISTORY — PX: COLONOSCOPY WITH PROPOFOL: SHX5780

## 2018-11-22 HISTORY — PX: POLYPECTOMY: SHX5525

## 2018-11-22 SURGERY — COLONOSCOPY WITH PROPOFOL
Anesthesia: General

## 2018-11-22 MED ORDER — MIDAZOLAM HCL 2 MG/2ML IJ SOLN: 0.5000 mg | Freq: Once | INTRAMUSCULAR | Status: DC | PRN

## 2018-11-22 MED ORDER — CHLORHEXIDINE GLUCONATE CLOTH 2 % EX PADS: 6.0000 | MEDICATED_PAD | Freq: Once | CUTANEOUS | Status: DC

## 2018-11-22 MED ORDER — HYDROCODONE-ACETAMINOPHEN 7.5-325 MG PO TABS: 1.0000 | ORAL_TABLET | Freq: Once | ORAL | Status: DC | PRN

## 2018-11-22 MED ORDER — PROPOFOL 10 MG/ML IV BOLUS
INTRAVENOUS | Status: AC
  Filled 2018-11-22: qty 140

## 2018-11-22 MED ORDER — PROPOFOL 10 MG/ML IV BOLUS
INTRAVENOUS | Status: DC | PRN
  Administered 2018-11-22: 20 mg via INTRAVENOUS
  Administered 2018-11-22: 40 mg via INTRAVENOUS

## 2018-11-22 MED ORDER — HYDROMORPHONE HCL 1 MG/ML IJ SOLN: 0.2500 mg | INTRAMUSCULAR | Status: DC | PRN

## 2018-11-22 MED ORDER — LACTATED RINGERS IV SOLN
INTRAVENOUS | Status: DC
  Administered 2018-11-22: 08:00:00 via INTRAVENOUS

## 2018-11-22 MED ORDER — PROPOFOL 500 MG/50ML IV EMUL
INTRAVENOUS | Status: DC | PRN
  Administered 2018-11-22: 200 ug/kg/min via INTRAVENOUS

## 2018-11-22 MED ORDER — PROMETHAZINE HCL 25 MG/ML IJ SOLN: 6.2500 mg | INTRAMUSCULAR | Status: DC | PRN

## 2018-11-22 NOTE — Anesthesia Preprocedure Evaluation (Signed)
Anesthesia Evaluation  Patient identified by MRN, date of birth, ID band Patient awake    Reviewed: Allergy & Precautions, NPO status , Patient's Chart, lab work & pertinent test results  Airway Mallampati: II  TM Distance: >3 FB Neck ROM: Full    Dental no notable dental hx. (+) Teeth Intact   Pulmonary neg pulmonary ROS, Current SmokerPatient did not abstain from smoking.,    Pulmonary exam normal breath sounds clear to auscultation       Cardiovascular Exercise Tolerance: Good hypertension, negative cardio ROS Normal cardiovascular examI Rhythm:Regular Rate:Normal     Neuro/Psych  Headaches, PSYCHIATRIC DISORDERS Depression    GI/Hepatic negative GI ROS, Neg liver ROS,   Endo/Other  negative endocrine ROS  Renal/GU negative Renal ROS  negative genitourinary   Musculoskeletal negative musculoskeletal ROS (+)   Abdominal   Peds negative pediatric ROS (+)  Hematology negative hematology ROS (+) anemia ,   Anesthesia Other Findings   Reproductive/Obstetrics negative OB ROS                             Anesthesia Physical Anesthesia Plan  ASA: II  Anesthesia Plan: General   Post-op Pain Management:    Induction: Intravenous  PONV Risk Score and Plan: 2 and Propofol infusion, TIVA and Treatment may vary due to age or medical condition  Airway Management Planned: Nasal Cannula and Simple Face Mask  Additional Equipment:   Intra-op Plan:   Post-operative Plan:   Informed Consent: I have reviewed the patients History and Physical, chart, labs and discussed the procedure including the risks, benefits and alternatives for the proposed anesthesia with the patient or authorized representative who has indicated his/her understanding and acceptance.     Dental advisory given  Plan Discussed with: CRNA  Anesthesia Plan Comments: (Plan Full PPE use  Plan GA with GETA as needed d/w  pt -WTP with same after Q&A)        Anesthesia Quick Evaluation

## 2018-11-22 NOTE — Anesthesia Postprocedure Evaluation (Signed)
Anesthesia Post Note  Patient: Chelsey Lewis  Procedure(s) Performed: COLONOSCOPY WITH PROPOFOL (N/A ) POLYPECTOMY  Patient location during evaluation: PACU Anesthesia Type: General Level of consciousness: awake and alert and patient cooperative Pain management: satisfactory to patient Vital Signs Assessment: post-procedure vital signs reviewed and stable Respiratory status: spontaneous breathing Cardiovascular status: stable Postop Assessment: no apparent nausea or vomiting Anesthetic complications: no     Last Vitals:  Vitals:   11/22/18 0815 11/22/18 0819  BP:  125/84  Pulse: (!) 56 70  Resp: (!) 22 (!) 21  Temp:    SpO2: 100% 100%    Last Pain:  Vitals:   11/22/18 0807  TempSrc:   PainSc: 0-No pain                 Eliab Closson

## 2018-11-22 NOTE — Anesthesia Procedure Notes (Signed)
Date/Time: 11/22/2018 7:38 AM Performed by: Vista Deck, CRNA Pre-anesthesia Checklist: Patient identified, Emergency Drugs available, Suction available, Timeout performed and Patient being monitored Patient Re-evaluated:Patient Re-evaluated prior to induction Oxygen Delivery Method: Nasal Cannula

## 2018-11-22 NOTE — Transfer of Care (Signed)
Immediate Anesthesia Transfer of Care Note  Patient: AVYANA FURRY  Procedure(s) Performed: COLONOSCOPY WITH PROPOFOL (N/A ) POLYPECTOMY  Patient Location: PACU  Anesthesia Type:General  Level of Consciousness: awake, alert  and patient cooperative  Airway & Oxygen Therapy: Patient Spontanous Breathing  Post-op Assessment: Report given to RN and Post -op Vital signs reviewed and stable  Post vital signs: Reviewed and stable  Last Vitals:  Vitals Value Taken Time  BP    Temp    Pulse 61 11/22/18 0806  Resp 20 11/22/18 0806  SpO2 100 % 11/22/18 0806  Vitals shown include unvalidated device data.  Last Pain:  Vitals:   11/22/18 0707  TempSrc: Oral  PainSc: 0-No pain         Complications: No apparent anesthesia complications  SEE PACU FLOW SHEET FOR VITAL SIGNS

## 2018-11-22 NOTE — Op Note (Signed)
[facility] Patient Name: Chelsey Lewis Procedure Date: 11/22/2018 7:07 AM MRN: [id number] Date of Birth: 24-May-1970 Attending MD: Norvel Richards , MD CSN: [id number] Age: 48 Admit Type: Outpatient Procedure:                Colonoscopy Indications:              Screening for colorectal malignant neoplasm Providers:                Norvel Richards, MD, Janeece Riggers, RN, Aram Candela Referring MD:              Medicines:                Propofol per Anesthesia Complications:            No immediate complications. Estimated Blood Loss:     Estimated blood loss was minimal. Procedure:                Pre-Anesthesia Assessment:                           - Prior to the procedure, a History and Physical                            was performed, and patient medications and                            allergies were reviewed. The patient's tolerance of                            previous anesthesia was also reviewed. The risks                            and benefits of the procedure and the sedation                            options and risks were discussed with the patient.                            All questions were answered, and informed consent                            was obtained. Prior Anticoagulants: The patient has                            taken no previous anticoagulant or antiplatelet                            agents. ASA Grade Assessment: II - A patient with                            mild systemic disease. After reviewing the risks  and benefits, the patient was deemed in                            satisfactory condition to undergo the procedure.                           After obtaining informed consent, the colonoscope                            was passed under direct vision. Throughout the                            procedure, the patient's blood pressure, pulse, and                            oxygen  saturations were monitored continuously. The                            CF-[id number] scope was introduced through                            the anus and advanced to the the cecum, identified                            by appendiceal orifice and ileocecal valve. The                            colonoscopy was performed without difficulty. The                            patient tolerated the procedure well. The quality                            of the bowel preparation was adequate. The                            ileocecal valve, appendiceal orifice, and rectum                            were photographed. The entire colon was well                            visualized. Scope In: 7:46:45 AM Scope Out: 8:00:28 AM Scope Withdrawal Time: 0 hours 9 minutes 26 seconds  Total Procedure Duration: 0 hours 13 minutes 43 seconds  Findings:      The perianal and digital rectal examinations were normal.      Scattered small-mouthed diverticula were found in the entire colon.      A 5 mm polyp was found in the descending colon. The polyp was sessile.       The polyp was removed with a cold snare. Resection and retrieval were       complete. Estimated blood loss was minimal.      The exam was otherwise without abnormality. Impression:               -  Diverticulosis in the entire examined colon.                           - One 5 mm polyp in the descending colon, removed                            with a cold snare. Resected and retrieved.                           - The examination was otherwise normal. Moderate Sedation:      Moderate (conscious) sedation was personally administered by an       anesthesia professional. The following parameters were monitored: oxygen       saturation, heart rate, blood pressure, respiratory rate, EKG, adequacy       of pulmonary ventilation, and response to care. Recommendation:           - Patient has a contact number available for                             emergencies. The signs and symptoms of potential                            delayed complications were discussed with the                            patient. Return to normal activities tomorrow.                            Written discharge instructions were provided to the                            patient.                           - Continue present medications.                           - Repeat colonoscopy date to be determined after                            pending pathology results are reviewed for                            surveillance.                           - Return to GI clinic (date not yet determined). Procedure Code(s):        --- Professional ---                           [phone], Colonoscopy, flexible; with removal of                            tumor(s), polyp(s), or other lesion(s) by snare  technique Diagnosis Code(s):        --- Professional ---                           Z12.11, Encounter for screening for malignant                            neoplasm of colon                           K63.5, Polyp of colon                           K57.30, Diverticulosis of large intestine without                            perforation or abscess without bleeding CPT copyright 2019 American Medical Association. All rights reserved. The codes documented in this report are preliminary and upon coder review may  be revised to meet current compliance requirements. Cristopher Estimable. Drequan Ironside, MD Norvel Richards, MD 11/22/2018 8:10:13 AM This report has been signed electronically. Number of Addenda: 0

## 2018-11-22 NOTE — H&P (Signed)
@LOGO @   Primary Care Physician:  Fayrene Helper, MD Primary Gastroenterologist:  Dr. Gala Romney  Pre-Procedure History & Physical: HPI:  Chelsey Lewis is a 48 y.o. female is here for a screening colonoscopy.  No bowel symptoms.  No family history of colon cancer.  No prior colonoscopy.  Past Medical History:  Diagnosis Date  . Anemia   . Constipation   . DUB (dysfunctional uterine bleeding) 05/05/2012  . Hot flashes 02/23/2015  . Hyperlipidemia   . Hypertension   . Irregular periods/menstrual cycles 02/23/2015  . Migraine headache   . Overweight(278.02)   . Peri-menopausal 05/12/2012  . Peri-menopause 02/23/2015  . Postmenopausal 03/02/2015  . Tobacco abuse   . Tubal pregnancy   . Vaginal discharge   . Well adult exam     Past Surgical History:  Procedure Laterality Date  . 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 ECTOPIC PREGNANCY SURGERY    . oopherectomy and tubal ligation    . TUBAL LIGATION      Prior to Admission medications   Medication Sig Start Date End Date Taking? Authorizing Provider  amLODipine (NORVASC) 5 MG tablet TAKE 1 TABLET AT 9 PM EVERY NIGHT FOR BLOOD PRESSURE Patient taking differently: Take 5 mg by mouth at bedtime.  07/08/18  Yes Fayrene Helper, MD  FLUoxetine (PROZAC) 20 MG capsule Take 1 capsule (20 mg total) by mouth daily. 07/08/18  Yes Fayrene Helper, MD  rosuvastatin (CRESTOR) 5 MG tablet Take 1 tablet (5 mg total) by mouth daily. 07/08/18  Yes Fayrene Helper, MD  temazepam (RESTORIL) 30 MG capsule TAKE 1 CAPSULE (30 MG TOTAL) BY MOUTH AT BEDTIME AS NEEDED FOR SLEEP. 11/04/18  Yes Perlie Mayo, NP  azelastine (ASTELIN) 0.1 % nasal spray Place 2 sprays into both nostrils 2 (two) times daily. Use in each nostril as directed Patient taking differently: Place 2 sprays into both nostrils 2 (two) times daily as needed for allergies. Use in each nostril as directed 04/10/16   Fayrene Helper, MD  loratadine (CLARITIN) 10 MG tablet Take 1 tablet (10 mg total) by mouth daily. Patient taking differently: Take 10 mg by mouth daily as needed.  04/10/16   Fayrene Helper, MD    Allergies as of 09/21/2018  . (No Known Allergies)    Family History  Problem Relation Age of Onset  . Coronary artery disease Father        with CABG   . COPD Mother   . Diabetes Mother   . Hypertension Mother   . Hypertension Sister   . Cancer Sister 73       uterine  . Hypertension Sister   . Dementia Maternal Grandmother   . Cancer Maternal Grandmother        breast and  lung  . Colon cancer Neg Hx   . Colon polyps Neg Hx     Social History   Socioeconomic History  . Marital status: Married    Spouse name: Not on file  . Number of children: 2  . Years of education: Not on file  . Highest education level: Not on file  Occupational History  . Occupation: Insurance account manager  . Financial resource strain: Not on file  . Food insecurity    Worry: Not on file    Inability: Not on file  . Transportation needs    Medical: Not on  file    Non-medical: Not on file  Tobacco Use  . Smoking status: Current Some Day Smoker    Packs/day: 0.50    Years: 21.00    Pack years: 10.50    Types: Cigarettes    Last attempt to quit: 07/12/2017    Years since quitting: 1.3  . Smokeless tobacco: Never Used  Substance and Sexual Activity  . Alcohol use: No    Alcohol/week: 0.0 standard drinks  . Drug use: No  . Sexual activity: Yes    Birth control/protection: Surgical    Comment: tubal  Lifestyle  . Physical activity    Days per week: Not on file    Minutes per session: Not on file  . Stress: Not on file  Relationships  . Social Herbalist on phone: Not on file    Gets together: Not on file    Attends religious service: Not on file    Active member of club or organization: Not on file    Attends meetings of clubs or organizations: Not on file    Relationship  status: Not on file  . Intimate partner violence    Fear of current or ex partner: Not on file    Emotionally abused: Not on file    Physically abused: Not on file    Forced sexual activity: Not on file  Other Topics Concern  . Not on file  Social History Narrative  . Not on file    Review of Systems: See HPI, otherwise negative ROS  Physical Exam: BP 134/82   Pulse 62   Temp 98.2 F (36.8 C) (Oral)   Resp (!) 22   LMP 10/11/2015 (Approximate)   SpO2 99%  General:   Alert,  Well-developed, well-nourished, pleasant and cooperative in NAD Neck:  Supple; no masses or thyromegaly. Lungs:  Clear throughout to auscultation.   No wheezes, crackles, or rhonchi. No acute distress. Heart:  Regular rate and rhythm; no murmurs, clicks, rubs,  or gallops. Abdomen:  Soft, nontender and nondistended. No masses, hepatosplenomegaly or hernias noted. Normal bowel sounds, without guarding, and without rebound.     Impression/Plan: Chelsey Lewis is now here to undergo a screening colonoscopy.  First ever average rescreening examination  Risks, benefits, limitations, imponderables and alternatives regarding colonoscopy have been reviewed with the patient. Questions have been answered. All parties agreeable.     Notice:  This dictation was prepared with Dragon dictation along with smaller phrase technology. Any transcriptional errors that result from this process are unintentional and may not be corrected upon review.

## 2018-11-22 NOTE — Discharge Instructions (Signed)
Colonoscopy Discharge Instructions  Read the instructions outlined below and refer to this sheet in the next few weeks. These discharge instructions provide you with general information on caring for yourself after you leave the hospital. Your doctor may also give you specific instructions. While your treatment has been planned according to the most current medical practices available, unavoidable complications occasionally occur. If you have any problems or questions after discharge, call Dr. Gala Romney at [phone]. ACTIVITY  You may resume your regular activity, but move at a slower pace for the next 24 hours.   Take frequent rest periods for the next 24 hours.   Walking will help get rid of the air and reduce the bloated feeling in your belly (abdomen).   No driving for 24 hours (because of the medicine (anesthesia) used during the test).    Do not sign any important legal documents or operate any machinery for 24 hours (because of the anesthesia used during the test).  NUTRITION  Drink plenty of fluids.   You may resume your normal diet as instructed by your doctor.   Begin with a light meal and progress to your normal diet. Heavy or fried foods are harder to digest and may make you feel sick to your stomach (nauseated).   Avoid alcoholic beverages for 24 hours or as instructed.  MEDICATIONS  You may resume your normal medications unless your doctor tells you otherwise.  WHAT YOU CAN EXPECT TODAY  Some feelings of bloating in the abdomen.   Passage of more gas than usual.   Spotting of blood in your stool or on the toilet paper.  IF YOU HAD POLYPS REMOVED DURING THE COLONOSCOPY:  No aspirin products for 7 days or as instructed.   No alcohol for 7 days or as instructed.   Eat a soft diet for the next 24 hours.  FINDING OUT THE RESULTS OF YOUR TEST Not all test results are available during your visit. If your test results are not back during the visit, make an appointment  with your caregiver to find out the results. Do not assume everything is normal if you have not heard from your caregiver or the medical facility. It is important for you to follow up on all of your test results.  SEEK IMMEDIATE MEDICAL ATTENTION IF:  You have more than a spotting of blood in your stool.   Your belly is swollen (abdominal distention).   You are nauseated or vomiting.   You have a temperature over 101.   You have abdominal pain or discomfort that is severe or gets worse throughout the day.    Colon Polyps  Polyps are tissue growths inside the body. Polyps can grow in many places, including the large intestine (colon). A polyp may be a round bump or a mushroom-shaped growth. You could have one polyp or several. Most colon polyps are noncancerous (benign). However, some colon polyps can become cancerous over time. Finding and removing the polyps early can help prevent this. What are the causes? The exact cause of colon polyps is not known. What increases the risk? You are more likely to develop this condition if you:  Have a family history of colon cancer or colon polyps.  Are older than 94 or older than 45 if you are African American.  Have inflammatory bowel disease, such as ulcerative colitis or Crohn's disease.  Have certain hereditary conditions, such as: ? Familial adenomatous polyposis. ? Lynch syndrome. ? Turcot syndrome. ? Peutz-Jeghers syndrome.  Are  overweight.  Smoke cigarettes.  Do not get enough exercise.  Drink too much alcohol.  Eat a diet that is high in fat and red meat and low in fiber.  Had childhood cancer that was treated with abdominal radiation. What are the signs or symptoms? Most polyps do not cause symptoms. If you have symptoms, they may include:  Blood coming from your rectum when having a bowel movement.  Blood in your stool. The stool may look dark red or black.  Abdominal pain.  A change in bowel habits, such as  constipation or diarrhea. How is this diagnosed? This condition is diagnosed with a colonoscopy. This is a procedure in which a lighted, flexible scope is inserted into the anus and then passed into the colon to examine the area. Polyps are sometimes found when a colonoscopy is done as part of routine cancer screening tests. How is this treated? Treatment for this condition involves removing any polyps that are found. Most polyps can be removed during a colonoscopy. Those polyps will then be tested for cancer. Additional treatment may be needed depending on the results of testing. Follow these instructions at home: Lifestyle  Maintain a healthy weight, or lose weight if recommended by your health care provider.  Exercise every day or as told by your health care provider.  Do not use any products that contain nicotine or tobacco, such as cigarettes and e-cigarettes. If you need help quitting, ask your health care provider.  If you drink alcohol, limit how much you have: ? 0-1 drink a day for women. ? 0-2 drinks a day for men.  Be aware of how much alcohol is in your drink. In the [place] one drink equals one 12 oz bottle of beer (355 mL), one 5 oz glass of wine (148 mL), or one 1 oz shot of hard liquor (44 mL). Eating and drinking   Eat foods that are high in fiber, such as fruits, vegetables, and whole grains.  Eat foods that are high in calcium and vitamin D, such as milk, cheese, yogurt, eggs, liver, fish, and broccoli.  Limit foods that are high in fat, such as fried foods and desserts.  Limit the amount of red meat and processed meat you eat, such as hot dogs, sausage, bacon, and lunch meats. General instructions  Keep all follow-up visits as told by your health care provider. This is important. ? This includes having regularly scheduled colonoscopies. ? Talk to your health care provider about when you need a colonoscopy. Contact a health care provider if:  You have new or  worsening bleeding during a bowel movement.  You have new or increased blood in your stool.  You have a change in bowel habits.  You lose weight for no known reason. Summary  Polyps are tissue growths inside the body. Polyps can grow in many places, including the colon.  Most colon polyps are noncancerous (benign), but some can become cancerous over time.  This condition is diagnosed with a colonoscopy.  Treatment for this condition involves removing any polyps that are found. Most polyps can be removed during a colonoscopy. This information is not intended to replace advice given to you by your health care provider. Make sure you discuss any questions you have with your health care provider. Document Released: 09/26/2003 Document Revised: 04/16/2017 Document Reviewed: 04/16/2017 Elsevier Patient Education  [place].  Diverticulosis  Diverticulosis is a condition that develops when small pouches (diverticula) form in the wall of the large  intestine (colon). The colon is where water is absorbed and stool is formed. The pouches form when the inside layer of the colon pushes through weak spots in the outer layers of the colon. You may have a few pouches or many of them. What are the causes? The cause of this condition is not known. What increases the risk? The following factors may make you more likely to develop this condition:  Being older than age 24. Your risk for this condition increases with age. Diverticulosis is rare among people younger than age 60. By age 54, many people have it.  Eating a low-fiber diet.  Having frequent constipation.  Being overweight.  Not getting enough exercise.  Smoking.  Taking over-the-counter pain medicines, like aspirin and ibuprofen.  Having a family history of diverticulosis. What are the signs or symptoms? In most people, there are no symptoms of this condition. If you do have symptoms, they may include:  Bloating.  Cramps  in the abdomen.  Constipation or diarrhea.  Pain in the lower left side of the abdomen. How is this diagnosed? This condition is most often diagnosed during an exam for other colon problems. Because diverticulosis usually has no symptoms, it often cannot be diagnosed independently. This condition may be diagnosed by:  Using a flexible scope to examine the colon (colonoscopy).  Taking an X-ray of the colon after dye has been put into the colon (barium enema).  Doing a CT scan. How is this treated? You may not need treatment for this condition if you have never developed an infection related to diverticulosis. If you have had an infection before, treatment may include:  Eating a high-fiber diet. This may include eating more fruits, vegetables, and grains.  Taking a fiber supplement.  Taking a live bacteria supplement (probiotic).  Taking medicine to relax your colon.  Taking antibiotic medicines. Follow these instructions at home:  Drink 6-8 glasses of water or more each day to prevent constipation.  Try not to strain when you have a bowel movement.  If you have had an infection before: ? Eat more fiber as directed by your health care provider or your diet and nutrition specialist (dietitian). ? Take a fiber supplement or probiotic, if your health care provider approves.  Take over-the-counter and prescription medicines only as told by your health care provider.  If you were prescribed an antibiotic, take it as told by your health care provider. Do not stop taking the antibiotic even if you start to feel better.  Keep all follow-up visits as told by your health care provider. This is important. Contact a health care provider if:  You have pain in your abdomen.  You have bloating.  You have cramps.  You have not had a bowel movement in 3 days. Get help right away if:  Your pain gets worse.  Your bloating becomes very bad.  You have a fever or chills, and your  symptoms suddenly get worse.  You vomit.  You have bowel movements that are bloody or black.  You have bleeding from your rectum. Summary  Diverticulosis is a condition that develops when small pouches (diverticula) form in the wall of the large intestine (colon).  You may have a few pouches or many of them.  This condition is most often diagnosed during an exam for other colon problems.  If you have had an infection related to diverticulosis, treatment may include increasing the fiber in your diet, taking supplements, or taking medicines. This information is  not intended to replace advice given to you by your health care provider. Make sure you discuss any questions you have with your health care provider. Document Released: 09/27/2003 Document Revised: 12/12/2016 Document Reviewed: 11/19/2015 Elsevier Patient Education  [place].   Diverticulosis information provided  Polyp information provided  Further recommendations to follow pending review of pathology report  At patient request. I called daughter at [phone] voicemail.  Did not leave a message.

## 2018-11-23 ENCOUNTER — Encounter: Payer: Self-pay | Admitting: Internal Medicine

## 2018-11-23 LAB — SURGICAL PATHOLOGY

## 2018-11-25 ENCOUNTER — Encounter (HOSPITAL_COMMUNITY): Payer: Self-pay | Admitting: Internal Medicine

## 2018-12-02 ENCOUNTER — Ambulatory Visit: Payer: BC Managed Care – PPO | Admitting: Family Medicine

## 2018-12-02 ENCOUNTER — Encounter: Payer: Self-pay | Admitting: Family Medicine

## 2018-12-02 ENCOUNTER — Other Ambulatory Visit: Payer: Self-pay

## 2018-12-02 VITALS — BP 120/82 | HR 63 | Temp 98.1°F | Resp 15 | Ht 65.0 in | Wt 182.0 lb

## 2018-12-02 DIAGNOSIS — I10 Essential (primary) hypertension: Secondary | ICD-10-CM | POA: Diagnosis not present

## 2018-12-02 DIAGNOSIS — E669 Obesity, unspecified: Secondary | ICD-10-CM

## 2018-12-02 DIAGNOSIS — E785 Hyperlipidemia, unspecified: Secondary | ICD-10-CM

## 2018-12-02 DIAGNOSIS — F17218 Nicotine dependence, cigarettes, with other nicotine-induced disorders: Secondary | ICD-10-CM

## 2018-12-02 DIAGNOSIS — F5104 Psychophysiologic insomnia: Secondary | ICD-10-CM

## 2018-12-02 DIAGNOSIS — Z23 Encounter for immunization: Secondary | ICD-10-CM | POA: Diagnosis not present

## 2018-12-02 DIAGNOSIS — F324 Major depressive disorder, single episode, in partial remission: Secondary | ICD-10-CM

## 2018-12-02 DIAGNOSIS — E559 Vitamin D deficiency, unspecified: Secondary | ICD-10-CM

## 2018-12-02 DIAGNOSIS — F17210 Nicotine dependence, cigarettes, uncomplicated: Secondary | ICD-10-CM

## 2018-12-02 NOTE — Assessment & Plan Note (Signed)
Controlled, no change in medication DASH diet and commitment to daily physical activity for a minimum of 30 minutes discussed and encouraged, as a part of hypertension management. The importance of attaining a healthy weight is also discussed.  BP/Weight 12/02/2018 11/22/2018 10/21/2018 09/06/2018 07/08/2018 04/01/2018 [id number]  Systolic BP 123456 123456 0000000 [id number]  Diastolic BP 82 68 66 88 69 76 87  Wt. (Lbs) 182 - 183.04 185.4 188 208 210  BMI 30.29 - 30.46 30.85 30.34 33.57 33.89

## 2018-12-02 NOTE — Patient Instructions (Addendum)
F/U in office with MD in 6 months, call if you need med sooner  CONGRATS on excellent weight loss!! Shingrix # 2 today  Please keep quit date at 01/14/2019, now only smoking 5/ day, need to quit!  Please get fasting lipid, cmp and EGFr, and vit D level today, result will be sent to " my chart"  Next colonoscopy is due in 2027  Best for the season and 2021!  Thanks for choosing [facility], we consider it a privelige to serve you.    Steps to Quit Smoking Smoking tobacco is the leading cause of preventable death. It can affect almost every organ in the body. Smoking puts you and people around you at risk for many serious, long-lasting (chronic) diseases. Quitting smoking can be hard, but it is one of the best things that you can do for your health. It is never too late to quit. How do I get ready to quit? When you decide to quit smoking, make a plan to help you succeed. Before you quit:  Pick a date to quit. Set a date within the next 2 weeks to give you time to prepare.  Write down the reasons why you are quitting. Keep this list in places where you will see it often.  Tell your family, friends, and co-workers that you are quitting. Their support is important.  Talk with your doctor about the choices that may help you quit.  Find out if your health insurance will pay for these treatments.  Know the people, places, things, and activities that make you want to smoke (triggers). Avoid them. What first steps can I take to quit smoking?  Throw away all cigarettes at home, at work, and in your car.  Throw away the things that you use when you smoke, such as ashtrays and lighters.  Clean your car. Make sure to empty the ashtray.  Clean your home, including curtains and carpets. What can I do to help me quit smoking? Talk with your doctor about taking medicines and seeing a counselor at the same time. You are more likely to succeed when you do both.  If you are  pregnant or breastfeeding, talk with your doctor about counseling or other ways to quit smoking. Do not take medicine to help you quit smoking unless your doctor tells you to do so. To quit smoking: Quit right away  Quit smoking totally, instead of slowly cutting back on how much you smoke over a period of time.  Go to counseling. You are more likely to quit if you go to counseling sessions regularly. Take medicine You may take medicines to help you quit. Some medicines need a prescription, and some you can buy over-the-counter. Some medicines may contain a drug called nicotine to replace the nicotine in cigarettes. Medicines may:  Help you to stop having the desire to smoke (cravings).  Help to stop the problems that come when you stop smoking (withdrawal symptoms). Your doctor may ask you to use:  Nicotine patches, gum, or lozenges.  Nicotine inhalers or sprays.  Non-nicotine medicine that is taken by mouth. Find resources Find resources and other ways to help you quit smoking and remain smoke-free after you quit. These resources are most helpful when you use them often. They include:  Online chats with a Social worker.  Phone quitlines.  Printed Furniture conservator/restorer.  Support groups or group counseling.  Text messaging programs.  Mobile phone apps. Use apps on your mobile phone or tablet that can  help you stick to your quit plan. There are many free apps for mobile phones and tablets as well as websites. Examples include Quit Guide from the [employer] and smokefree.gov  What things can I do to make it easier to quit?   Talk to your family and friends. Ask them to support and encourage you.  Call a phone quitline (1-800-QUIT-NOW), reach out to support groups, or work with a Social worker.  Ask people who smoke to not smoke around you.  Avoid places that make you want to smoke, such as: ? Bars. ? Parties. ? Smoke-break areas at work.  Spend time with people who do not  smoke.  Lower the stress in your life. Stress can make you want to smoke. Try these things to help your stress: ? Getting regular exercise. ? Doing deep-breathing exercises. ? Doing yoga. ? Meditating. ? Doing a body scan. To do this, close your eyes, focus on one area of your body at a time from head to toe. Notice which parts of your body are tense. Try to relax the muscles in those areas. How will I feel when I quit smoking? Day 1 to 3 weeks Within the first 24 hours, you may start to have some problems that come from quitting tobacco. These problems are very bad 2-3 days after you quit, but they do not often last for more than 2-3 weeks. You may get these symptoms:  Mood swings.  Feeling restless, nervous, angry, or annoyed.  Trouble concentrating.  Dizziness.  Strong desire for high-sugar foods and nicotine.  Weight gain.  Trouble pooping (constipation).  Feeling like you may vomit (nausea).  Coughing or a sore throat.  Changes in how the medicines that you take for other issues work in your body.  Depression.  Trouble sleeping (insomnia). Week 3 and afterward After the first 2-3 weeks of quitting, you may start to notice more positive results, such as:  Better sense of smell and taste.  Less coughing and sore throat.  Slower heart rate.  Lower blood pressure.  Clearer skin.  Better breathing.  Fewer sick days. Quitting smoking can be hard. Do not give up if you fail the first time. Some people need to try a few times before they succeed. Do your best to stick to your quit plan, and talk with your doctor if you have any questions or concerns. Summary  Smoking tobacco is the leading cause of preventable death. Quitting smoking can be hard, but it is one of the best things that you can do for your health.  When you decide to quit smoking, make a plan to help you succeed.  Quit smoking right away, not slowly over a period of time.  When you start  quitting, seek help from your doctor, family, or friends. This information is not intended to replace advice given to you by your health care provider. Make sure you discuss any questions you have with your health care provider. Document Released: 10/26/2008 Document Revised: 03/19/2018 Document Reviewed: 03/20/2018 Elsevier Patient Education  2020 [employer].

## 2018-12-04 ENCOUNTER — Encounter: Payer: Self-pay | Admitting: Family Medicine

## 2018-12-04 NOTE — Progress Notes (Signed)
Chelsey Lewis     MRN: [id number]      DOB: 1970-06-20   HPI Chelsey Lewis is here for follow up and re-evaluation of chronic medical conditions, medication management and review of any available recent lab and radiology data.  Preventive health is updated, specifically  Cancer screening and Immunization.   Recently had both colonoscopy and mammogram, good results The PT denies any adverse reactions to current medications since the last visit.  Doping well with weight management , despite being on no appetite suppressants, fully employed and this has made a big difference, also committed to exercise Smokes a few cigarette daily still, afraid that stopping will cause weight gain, but is committing to a quit date   ROS Denies recent fever or chills. Denies sinus pressure, nasal congestion, ear pain or sore throat. Denies chest congestion, productive cough or wheezing. Denies chest pains, palpitations and leg swelling Denies abdominal pain, nausea, vomiting,diarrhea or constipation.   Denies dysuria, frequency, hesitancy or incontinence. Denies joint pain, swelling and limitation in mobility. Denies headaches, seizures, numbness, or tingling. Denies uncontrolled  depression, anxiety or insomnia. Denies skin break down or rash.   PE  BP 120/82   Pulse 63   Temp 98.1 F (36.7 C) (Temporal)   Resp 15   Ht 5\' 5"  (1.651 m)   Wt 182 lb (82.6 kg)   LMP 10/11/2015 (Approximate)   SpO2 98%   BMI 30.29 kg/m   Patient alert and oriented and in no cardiopulmonary distress.  HEENT: No facial asymmetry, EOMI,     Neck supple .  Chest: Clear to auscultation bilaterally.  CVS: S1, S2 no murmurs, no S3.Regular rate.  ABD: Soft non tender.   Ext: No edema  MS: Adequate ROM spine, shoulders, hips and knees.  Skin: Intact, no ulcerations or rash noted.  Psych: Good eye contact, normal affect. Memory intact not anxious or depressed appearing.  CNS: CN 2-12 intact, power,  normal  throughout.no focal deficits noted.   Assessment & Plan  Essential hypertension Controlled, no change in medication DASH diet and commitment to daily physical activity for a minimum of 30 minutes discussed and encouraged, as a part of hypertension management. The importance of attaining a healthy weight is also discussed.  BP/Weight 12/02/2018 11/22/2018 10/21/2018 09/06/2018 07/08/2018 04/01/2018 [id number]  Systolic BP 123456 [id number] 0000000 [id number]  Diastolic BP 82 68 66 88 69 76 87  Wt. (Lbs) 182 - 183.04 185.4 188 208 210  BMI 30.29 - 30.46 30.85 30.34 33.57 33.89       Depression, major, recurrent (HCC) Controlled, no change in medication   Insomnia Sleep hygiene reviewed ,  Currently practices good hygiene Prescription sent for  medication needed.   Obesity (BMI 30.0-34.9)  Patient re-educated about  the importance of commitment to a  minimum of 150 minutes of exercise per week as able.  The importance of healthy food choices with portion control discussed, as well as eating regularly and within a 12 hour window most days. The need to choose "clean , green" food 50 to 75% of the time is discussed, as well as to make water the primary drink and set a goal of 64 ounces water daily.    Weight /BMI 12/02/2018 10/21/2018 09/06/2018  WEIGHT 182 lb 183 lb 0.6 oz 185 lb 6.4 oz  HEIGHT 5\' 5"  5\' 5"  5\' 5"   BMI 30.29 kg/m2 30.46 kg/m2 30.85 kg/m2      Nicotine dependence Asked:confirms currently smokes  cigarettes Assess: Unwilling to quit now,  but cutting back, set a quit date for 01/14/2019 Advise: needs to QUIT to reduce risk of cancer, cardio and cerebrovascular disease Assist: counseled for 5 minutes and literature provided Arrange: follow up in 3 months

## 2018-12-04 NOTE — Assessment & Plan Note (Signed)
  Patient re-educated about  the importance of commitment to a  minimum of 150 minutes of exercise per week as able.  The importance of healthy food choices with portion control discussed, as well as eating regularly and within a 12 hour window most days. The need to choose "clean , green" food 50 to 75% of the time is discussed, as well as to make water the primary drink and set a goal of 64 ounces water daily.    Weight /BMI 12/02/2018 10/21/2018 09/06/2018  WEIGHT 182 lb 183 lb 0.6 oz 185 lb 6.4 oz  HEIGHT 5\' 5"  5\' 5"  5\' 5"   BMI 30.29 kg/m2 30.46 kg/m2 30.85 kg/m2

## 2018-12-04 NOTE — Assessment & Plan Note (Signed)
Sleep hygiene reviewed ,  Currently practices good hygiene Prescription sent for  medication needed.

## 2018-12-04 NOTE — Assessment & Plan Note (Signed)
Asked:confirms currently smokes cigarettes Assess: Unwilling to quit now,  but cutting back, set a quit date for 01/14/2019 Advise: needs to QUIT to reduce risk of cancer, cardio and cerebrovascular disease Assist: counseled for 5 minutes and literature provided Arrange: follow up in 3 months

## 2018-12-04 NOTE — Assessment & Plan Note (Signed)
Controlled, no change in medication  

## 2018-12-07 ENCOUNTER — Other Ambulatory Visit: Payer: Self-pay | Admitting: Family Medicine

## 2018-12-07 ENCOUNTER — Other Ambulatory Visit: Payer: Self-pay

## 2018-12-07 MED ORDER — TEMAZEPAM 30 MG PO CAPS: 30.0000 mg | ORAL_CAPSULE | Freq: Every evening | ORAL | 0 refills | Status: DC | PRN

## 2018-12-07 MED ORDER — TEMAZEPAM 30 MG PO CAPS: 30.0000 mg | ORAL_CAPSULE | Freq: Every evening | ORAL | 5 refills | Status: DC | PRN

## 2018-12-30 ENCOUNTER — Ambulatory Visit: Payer: BC Managed Care – PPO | Admitting: Family Medicine

## 2019-01-03 ENCOUNTER — Other Ambulatory Visit: Payer: Self-pay

## 2019-01-03 ENCOUNTER — Ambulatory Visit (INDEPENDENT_AMBULATORY_CARE_PROVIDER_SITE_OTHER): Payer: BC Managed Care – PPO | Admitting: Adult Health

## 2019-01-03 ENCOUNTER — Encounter: Payer: Self-pay | Admitting: Adult Health

## 2019-01-03 VITALS — BP 128/74 | HR 83 | Ht 66.0 in | Wt 187.0 lb

## 2019-01-03 DIAGNOSIS — N950 Postmenopausal bleeding: Secondary | ICD-10-CM | POA: Diagnosis not present

## 2019-01-03 DIAGNOSIS — R319 Hematuria, unspecified: Secondary | ICD-10-CM | POA: Diagnosis not present

## 2019-01-03 LAB — POCT URINALYSIS DIPSTICK

## 2019-01-03 NOTE — Progress Notes (Signed)
  Subjective:     Patient ID: Chelsey Lewis, female   DOB: October 09, 1970, 48 y.o.   MRN: [id number]  HPI Chelsey Lewis is a 48 year old black female, PM in complaining of spotting on and offf since October, when wipes She saw PCP and had trace of blood in urine PCP is Dr Moshe Cipro.   Review of Systems Has had vaginal spotting when wipes on and off since October Had trace of blood in urine At PCP Denies any pain  Reviewed past medical,surgical, social and family history. Reviewed medications and allergies.     Objective:   Physical Exam BP 128/74 (BP Location: Left Arm, Patient Position: Sitting, Cuff Size: Normal)   Pulse 83   Ht 5\' 6"  (1.676 m)   Wt 187 lb (84.8 kg)   LMP 10/11/2015 (Approximate)   BMI 30.18 kg/m urine trace blood Skin warm and dry. Lungs: clear to ausculation bilaterally. Cardiovascular: regular rate and rhythm. Pelvic: external genitalia is normal in appearance no lesions, vagina: white discharge without odor, no blood in vault,urethra has no lesions or masses noted, cervix:smooth and bulbous, uterus: normal size, shape and contour, non tender, no masses felt, adnexa: no masses or tenderness noted. Bladder is non tender and no masses felt. Examination chaperoned by Estill Bamberg Rash LPN.    Assessment:     1. PMB (postmenopausal bleeding) Will get GYN [place] to assess uterus   2. Hematuria, unspecified type    Plan:     Return after christmas for GYN [place]

## 2019-01-17 ENCOUNTER — Other Ambulatory Visit: Payer: Self-pay

## 2019-01-17 ENCOUNTER — Ambulatory Visit (INDEPENDENT_AMBULATORY_CARE_PROVIDER_SITE_OTHER): Payer: BC Managed Care – PPO

## 2019-01-17 DIAGNOSIS — N950 Postmenopausal bleeding: Secondary | ICD-10-CM

## 2019-01-17 NOTE — Progress Notes (Signed)
PELVIC US TA/TV: enlarged heterogeneous uterus with mult.fibroids,(#1) posterior subserosal fibroid 7.3 x 6.2 x 6.9 cm,(#2) 3.5 x 3.6 x 3.9 cm,limited view of endometrium, EEC 4 mm,normal ovaries,limited view of left ovary,no free fluid,no pain during ultrasound

## 2019-01-24 ENCOUNTER — Other Ambulatory Visit: Payer: Self-pay | Admitting: Family Medicine

## 2019-03-17 ENCOUNTER — Ambulatory Visit: Payer: BC Managed Care – PPO

## 2019-03-17 ENCOUNTER — Encounter: Payer: Self-pay | Admitting: Family Medicine

## 2019-03-17 ENCOUNTER — Other Ambulatory Visit: Payer: Self-pay | Admitting: Family Medicine

## 2019-03-17 ENCOUNTER — Ambulatory Visit (INDEPENDENT_AMBULATORY_CARE_PROVIDER_SITE_OTHER): Payer: BC Managed Care – PPO | Admitting: Orthopaedic Surgery

## 2019-03-17 ENCOUNTER — Encounter: Payer: Self-pay | Admitting: Orthopaedic Surgery

## 2019-03-17 ENCOUNTER — Other Ambulatory Visit: Payer: Self-pay

## 2019-03-17 VITALS — BP 129/81 | HR 72 | Ht 66.0 in | Wt 187.0 lb

## 2019-03-17 DIAGNOSIS — G8929 Other chronic pain: Secondary | ICD-10-CM

## 2019-03-17 DIAGNOSIS — M25562 Pain in left knee: Secondary | ICD-10-CM

## 2019-03-17 LAB — COMPLETE METABOLIC PANEL WITH GFR
AG Ratio: 1.6 (calc) (ref 1.0–2.5)
ALT: 11 U/L (ref 6–29)
AST: 17 U/L (ref 10–35)
Albumin: 4.2 g/dL (ref 3.6–5.1)
Alkaline phosphatase (APISO): 63 U/L (ref 37–153)
BUN: 10 mg/dL (ref 7–25)
CO2: 27 mmol/L (ref 20–32)
Calcium: 10.3 mg/dL (ref 8.6–10.4)
Chloride: 109 mmol/L (ref 98–110)
Creat: 0.86 mg/dL (ref 0.50–1.05)
GFR, Est African American: 91 mL/min/{1.73_m2} (ref >=60)
GFR, Est Non African American: 79 mL/min/{1.73_m2} (ref >=60)
Globulin: 2.7 g/dL (calc) (ref 1.9–3.7)
Glucose, Bld: 76 mg/dL (ref 65–99)
Potassium: 4.3 mmol/L (ref 3.5–5.3)
Sodium: 141 mmol/L (ref 135–146)
Total Bilirubin: 0.4 mg/dL (ref 0.2–1.2)
Total Protein: 6.9 g/dL (ref 6.1–8.1)

## 2019-03-17 LAB — LIPID PANEL
Cholesterol: 177 mg/dL (ref <200)
HDL: 44 mg/dL — ABNORMAL LOW (ref >=50)
LDL Cholesterol (Calc): 119 mg/dL (calc) — ABNORMAL HIGH
Non-HDL Cholesterol (Calc): 133 mg/dL (calc) — ABNORMAL HIGH (ref <130)
Total CHOL/HDL Ratio: 4 (calc) (ref <5.0)
Triglycerides: 58 mg/dL (ref <150)

## 2019-03-17 LAB — VITAMIN D 25 HYDROXY (VIT D DEFICIENCY, FRACTURES): Vit D, 25-Hydroxy: 11 ng/mL — ABNORMAL LOW (ref 30–100)

## 2019-03-17 MED ORDER — NAPROXEN 500 MG PO TABS: 500.0000 mg | ORAL_TABLET | Freq: Two times a day (BID) | ORAL | 5 refills | Status: DC

## 2019-03-17 MED ORDER — ERGOCALCIFEROL 1.25 MG (50000 UT) PO CAPS: 50000.0000 [IU] | ORAL_CAPSULE | ORAL | 2 refills | Status: DC

## 2019-03-17 NOTE — Progress Notes (Signed)
Patient [id number] Chelsey Lewis, female DOB:01/10/1971, 49 y.o. [id number]  Chief Complaint  Patient presents with  . Knee Pain    left    HPI  Chelsey Lewis is a 49 y.o. female who has recurrent pain of the left knee.  I last saw her in December of 2018.  She has developed pain in the left knee again over the last several months.  She has swelling, popping and giving way. She has no trauma.  She is working a 10 hour day standing.  She has no redness.  The giving way bothers her.  She has tried ibuprofen and ice and heat with little help.   Body mass index is 30.18 kg/m.  ROS  Review of Systems  Constitutional: Positive for activity change.  Musculoskeletal: Positive for arthralgias, gait problem and joint swelling.  All other systems reviewed and are negative.   All other systems reviewed and are negative.  The following is a summary of the past history medically, past history surgically, known current medicines, social history and family history.  This information is gathered electronically by the computer from prior information and documentation.  I review this each visit and have found including this information at this point in the chart is beneficial and informative.    Past Medical History:  Diagnosis Date  . Anemia   . Constipation   . DUB (dysfunctional uterine bleeding) 05/05/2012  . Hot flashes 02/23/2015  . Hyperlipidemia   . Hypertension   . Irregular periods/menstrual cycles 02/23/2015  . Migraine headache   . Overweight(278.02)   . Peri-menopausal 05/12/2012  . Peri-menopause 02/23/2015  . Postmenopausal 03/02/2015  . Tobacco abuse   . Tubal pregnancy   . Vaginal discharge   . Well adult exam     Past Surgical History:  Procedure Laterality Date  . 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 COLONOSCOPY WITH PROPOFOL N/A 11/22/2018   Procedure: COLONOSCOPY WITH PROPOFOL;  Surgeon: Daneil Dolin, MD;  Location: [facility];  Service: Endoscopy;  Laterality: N/A;  7:30am  . ECTOPIC PREGNANCY SURGERY    . oopherectomy and tubal ligation    . POLYPECTOMY  11/22/2018   Procedure: POLYPECTOMY;  Surgeon: Daneil Dolin, MD;  Location: [facility];  Service: Endoscopy;;  . TUBAL LIGATION      Family History  Problem Relation Age of Onset  . Coronary artery disease Father        with CABG   . COPD Mother   . Diabetes Mother   . Hypertension Mother   . Hypertension Sister   . Cancer Sister 40       uterine  . Hypertension Sister   . Dementia Maternal Grandmother   . Cancer Maternal Grandmother        breast and  lung  . Colon cancer Neg Hx   . Colon polyps Neg Hx     Social History Social History   Tobacco Use  . Smoking status: Current Some Day Smoker    Packs/day: 0.50    Years: 21.00    Pack years: 10.50    Types: Cigarettes    Last attempt to quit: 07/12/2017    Years since quitting: 1.6  . Smokeless tobacco: Never Used  Substance Use Topics  . Alcohol use: No    Alcohol/week: 0.0 standard drinks  . Drug use: No    No Known Allergies  Current Outpatient Medications  Medication Sig Dispense Refill  . amLODipine (NORVASC) 5 MG tablet TAKE 1 TABLET AT 9 PM EVERY NIGHT FOR BLOOD PRESSURE 90 tablet 1  . azelastine (ASTELIN) 0.1 % nasal spray Place 2 sprays into both nostrils 2 (two) times daily. Use in each nostril as directed (Patient taking differently: Place 2 sprays into both nostrils 2 (two) times daily as needed for allergies. Use in each nostril as directed) 30 mL 12  . ergocalciferol (VITAMIN D2) 1.25 MG (50000 UT) capsule Take 1 capsule (50,000 Units total) by mouth once a week. One capsule once weekly 12 capsule 2  . FLUoxetine (PROZAC) 20 MG capsule TAKE 1 CAPSULE BY MOUTH EVERY DAY 90 capsule 1  . loratadine (CLARITIN) 10 MG tablet Take 1 tablet (10 mg total) by mouth daily. (Patient taking differently: Take 10 mg by mouth daily as needed. ) 30 tablet 11  . rosuvastatin  (CRESTOR) 5 MG tablet Take 1 tablet (5 mg total) by mouth daily. 90 tablet 3  . temazepam (RESTORIL) 30 MG capsule Take 1 capsule (30 mg total) by mouth at bedtime as needed for sleep. 30 capsule 0   No current facility-administered medications for this visit.     Physical Exam  Blood pressure 129/81, pulse 72, height 5\' 6"  (1.676 m), weight 187 lb (84.8 kg), last menstrual period 10/11/2015.  Constitutional: overall normal hygiene, normal nutrition, well developed, normal grooming, normal body habitus. Assistive device:none  Musculoskeletal: gait and station Limp left, muscle tone and strength are normal, no tremors or atrophy is present.  .  Neurological: coordination overall normal.  Deep tendon reflex/nerve stretch intact.  Sensation normal.  Cranial nerves II-XII intact.   Skin:   Normal overall no scars, lesions, ulcers or rashes. No psoriasis.  Psychiatric: Alert and oriented x 3.  Recent memory intact, remote memory unclear.  Normal mood and affect. Well groomed.  Good eye contact.  Cardiovascular: overall no swelling, no varicosities, no edema bilaterally, normal temperatures of the legs and arms, no clubbing, cyanosis and good capillary refill.  Lymphatic: palpation is normal.  Left knee has swelling, crepitus, medial joint line pain, ROM 0 to 110, positive medial McMurray, limp left, NV intact.  All other systems reviewed and are negative   X-rays were done of the left knee, reported separately.  The patient has been educated about the nature of the problem(s) and counseled on treatment options.  The patient appeared to understand what I have discussed and is in agreement with it.  Encounter Diagnosis  Name Primary?  . Chronic pain of left knee Yes    PROCEDURE NOTE:  The patient requests injections of the left knee , verbal consent was obtained.  The left knee was prepped appropriately after time out was performed.   Sterile technique was observed and  injection of 1 cc of Depo-Medrol 40 mg with several cc's of plain xylocaine. Anesthesia was provided by ethyl chloride and a 20-gauge needle was used to inject the knee area. The injection was tolerated well.  A band aid dressing was applied.  The patient was advised to apply ice later today and tomorrow to the injection sight as needed.  PLAN Call if any problems.  Precautions discussed.  Continue current medications.   Return to clinic 2 weeks   I will begin Naprosyn 500 po bid.  I am concerned about possible meniscus tear.  She may need MRI.  Electronically Signed Sanjuana Kava, MD 3/4/20219:34 AM

## 2019-03-31 ENCOUNTER — Encounter: Payer: Self-pay | Admitting: Orthopaedic Surgery

## 2019-03-31 ENCOUNTER — Other Ambulatory Visit: Payer: Self-pay

## 2019-03-31 ENCOUNTER — Ambulatory Visit: Payer: BC Managed Care – PPO | Admitting: Orthopaedic Surgery

## 2019-03-31 VITALS — BP 123/80 | HR 66 | Ht 66.0 in | Wt 197.0 lb

## 2019-03-31 DIAGNOSIS — G8929 Other chronic pain: Secondary | ICD-10-CM

## 2019-03-31 DIAGNOSIS — M25562 Pain in left knee: Secondary | ICD-10-CM

## 2019-03-31 NOTE — Progress Notes (Signed)
My knee is better  The injection last time helped and so did the Naprosyn.  She has less swelling, less pain and is walking better.  Encounter Diagnosis  Name Primary?  . Chronic pain of left knee Yes   Return in one month.  Call if any problem.  Precautions discussed.   Electronically Signed Sanjuana Kava, MD 3/18/20219:59 AM

## 2019-04-07 ENCOUNTER — Ambulatory Visit: Payer: BC Managed Care – PPO | Attending: Internal Medicine

## 2019-04-07 DIAGNOSIS — Z23 Encounter for immunization: Secondary | ICD-10-CM

## 2019-04-07 NOTE — Progress Notes (Signed)
   Covid-19 Vaccination Clinic  Name:  Chelsey Lewis    MRN: [id number] DOB: 1970/07/07  04/07/2019  [name] was observed post Covid-19 immunization for 15 minutes without incident. She was provided with Vaccine Information Sheet and instruction to access the V-Safe system.   [name] was instructed to call 911 with any severe reactions post vaccine: Marland Kitchen Difficulty breathing  . Swelling of face and throat  . A fast heartbeat  . A bad rash all over body  . Dizziness and weakness   Immunizations Administered    Name Date Dose VIS Date Route   Pfizer COVID-19 Vaccine 04/07/2019  8:33 AM 0.3 mL 12/24/2018 Intramuscular   Manufacturer: [place]   Lot: [id number]   [place]: [id number]

## 2019-04-09 ENCOUNTER — Ambulatory Visit: Payer: Self-pay

## 2019-04-27 ENCOUNTER — Encounter: Payer: Self-pay | Admitting: Family Medicine

## 2019-04-27 ENCOUNTER — Telehealth: Payer: Self-pay | Admitting: *Deleted

## 2019-04-27 NOTE — Telephone Encounter (Signed)
fmla for mother received Copied noted sleeved

## 2019-04-28 ENCOUNTER — Encounter: Payer: Self-pay | Admitting: Family Medicine

## 2019-04-28 ENCOUNTER — Ambulatory Visit: Payer: BC Managed Care – PPO | Admitting: Orthopaedic Surgery

## 2019-04-28 ENCOUNTER — Other Ambulatory Visit: Payer: Self-pay | Admitting: *Deleted

## 2019-04-28 MED ORDER — FLUTICASONE PROPIONATE 50 MCG/ACT NA SUSP: 2.0000 | Freq: Every day | NASAL | 2 refills | Status: DC

## 2019-05-02 ENCOUNTER — Ambulatory Visit: Payer: BC Managed Care – PPO | Attending: Internal Medicine

## 2019-05-02 DIAGNOSIS — Z23 Encounter for immunization: Secondary | ICD-10-CM

## 2019-05-02 NOTE — Progress Notes (Signed)
   Covid-19 Vaccination Clinic  Name:  CHALON YAZDANI    MRN: [id number] DOB: 02-Nov-1970  05/02/2019  Ms. Mower was observed post Covid-19 immunization for 15 minutes without incident. She was provided with Vaccine Information Sheet and instruction to access the V-Safe system.   Ms. Woolbright was instructed to call 911 with any severe reactions post vaccine: Marland Kitchen Difficulty breathing  . Swelling of face and throat  . A fast heartbeat  . A bad rash all over body  . Dizziness and weakness   Immunizations Administered    Name Date Dose VIS Date Route   Pfizer COVID-19 Vaccine 05/02/2019  3:14 PM 0.3 mL 03/09/2018 Intramuscular   Manufacturer: [place]   Lot: [id number]   [place]: [id number]

## 2019-05-13 ENCOUNTER — Encounter: Payer: Self-pay | Admitting: Family Medicine

## 2019-05-16 ENCOUNTER — Encounter: Payer: Self-pay | Admitting: Family Medicine

## 2019-05-19 ENCOUNTER — Encounter: Payer: Self-pay | Admitting: Orthopaedic Surgery

## 2019-05-19 ENCOUNTER — Ambulatory Visit: Payer: BC Managed Care – PPO | Admitting: Orthopaedic Surgery

## 2019-05-19 ENCOUNTER — Other Ambulatory Visit: Payer: Self-pay

## 2019-05-19 DIAGNOSIS — G8929 Other chronic pain: Secondary | ICD-10-CM | POA: Diagnosis not present

## 2019-05-19 DIAGNOSIS — M25562 Pain in left knee: Secondary | ICD-10-CM | POA: Diagnosis not present

## 2019-05-19 NOTE — Progress Notes (Signed)
PROCEDURE NOTE:  The patient requests injections of the left knee , verbal consent was obtained.  The left knee was prepped appropriately after time out was performed.   Sterile technique was observed and injection of 1 cc of Depo-Medrol 40 mg with several cc's of plain xylocaine. Anesthesia was provided by ethyl chloride and a 20-gauge needle was used to inject the knee area. The injection was tolerated well.  A band aid dressing was applied.  The patient was advised to apply ice later today and tomorrow to the injection sight as needed.  Electronically Signed Sanjuana Kava, MD 5/6/20219:31 AM

## 2019-05-19 NOTE — Patient Instructions (Addendum)

## 2019-05-20 ENCOUNTER — Other Ambulatory Visit: Payer: Self-pay | Admitting: Family Medicine

## 2019-06-01 ENCOUNTER — Ambulatory Visit: Payer: BC Managed Care – PPO | Admitting: Family Medicine

## 2019-06-09 ENCOUNTER — Telehealth: Payer: Self-pay

## 2019-06-09 NOTE — Telephone Encounter (Signed)
Talked to Chelsey Lewis, she called to make a payment on forms we completed for her to care for her Mother   She also advised that her Daughter was having Surgery Today.  I have sent a card to her

## 2019-06-09 NOTE — Telephone Encounter (Signed)
Faxed forms to [place] [employer] 29.00 over the phone rec [phone]

## 2019-06-28 ENCOUNTER — Other Ambulatory Visit: Payer: Self-pay | Admitting: Family Medicine

## 2019-06-28 NOTE — Telephone Encounter (Signed)
Last here 11/2018. Needed a 6 month follow up.

## 2019-06-28 NOTE — Telephone Encounter (Signed)
Pt has an appointment on Thursday

## 2019-06-28 NOTE — Telephone Encounter (Signed)
d 

## 2019-06-30 ENCOUNTER — Other Ambulatory Visit: Payer: Self-pay

## 2019-06-30 ENCOUNTER — Encounter: Payer: Self-pay | Admitting: Family Medicine

## 2019-06-30 ENCOUNTER — Ambulatory Visit (INDEPENDENT_AMBULATORY_CARE_PROVIDER_SITE_OTHER): Payer: BC Managed Care – PPO | Admitting: Family Medicine

## 2019-06-30 VITALS — BP 133/71 | HR 82 | Temp 97.4°F | Resp 16 | Ht 66.0 in | Wt 199.0 lb

## 2019-06-30 DIAGNOSIS — F5104 Psychophysiologic insomnia: Secondary | ICD-10-CM

## 2019-06-30 DIAGNOSIS — E66811 Obesity, class 1: Secondary | ICD-10-CM

## 2019-06-30 DIAGNOSIS — E8881 Metabolic syndrome: Secondary | ICD-10-CM

## 2019-06-30 DIAGNOSIS — F17218 Nicotine dependence, cigarettes, with other nicotine-induced disorders: Secondary | ICD-10-CM

## 2019-06-30 DIAGNOSIS — E669 Obesity, unspecified: Secondary | ICD-10-CM

## 2019-06-30 DIAGNOSIS — Z1159 Encounter for screening for other viral diseases: Secondary | ICD-10-CM

## 2019-06-30 DIAGNOSIS — I10 Essential (primary) hypertension: Secondary | ICD-10-CM | POA: Diagnosis not present

## 2019-06-30 DIAGNOSIS — E559 Vitamin D deficiency, unspecified: Secondary | ICD-10-CM

## 2019-06-30 DIAGNOSIS — J3089 Other allergic rhinitis: Secondary | ICD-10-CM

## 2019-06-30 DIAGNOSIS — E785 Hyperlipidemia, unspecified: Secondary | ICD-10-CM | POA: Diagnosis not present

## 2019-06-30 MED ORDER — PHENTERMINE HCL 37.5 MG PO TABS: ORAL_TABLET | ORAL | 1 refills | Status: DC

## 2019-06-30 MED ORDER — TEMAZEPAM 30 MG PO CAPS: 30.0000 mg | ORAL_CAPSULE | Freq: Every day | ORAL | 5 refills | Status: DC

## 2019-06-30 NOTE — Patient Instructions (Addendum)
Annual physical exam no Pap with MD in 4 months call if you need me sooner.  Please schedule mammogram at checkout. Please stop all cigarette smoking to protect your self from heart disease cancer and stroke, also chronic lung disease  New is phentermine half a tablet once daily.  Please commit to eating 3 scheduled meals and snacking only on vegetable or fruit.  Please also commit to drinking water as your only beverage or nonsweetened drink.  Weight loss goal of 10 to 12 pounds over the next 4 months.  New is HALF phentermine once daily at breakfast  Fasting cBC, lipid, cmp and eGFr, tSH and vit d 5 and hepatitis C screen days before next visit  It is important that you exercise regularly at least 30 minutes 5 times a week. If you develop chest pain, have severe difficulty breathing, or feel very tired, stop exercising immediately and seek medical attention

## 2019-07-04 ENCOUNTER — Encounter: Payer: Self-pay | Admitting: Family Medicine

## 2019-07-04 NOTE — Assessment & Plan Note (Signed)
Asked:confirms currently smokes cigarettes Assess: Unwilling to quit but cutting back Advise: needs to QUIT to reduce risk of cancer, cardio and cerebrovascular disease Assist: counseled for 5 minutes and literature provided Arrange: follow up in 3 months  

## 2019-07-04 NOTE — Assessment & Plan Note (Signed)
Sleep hygiene reviewed and written information offered also. Prescription sent for  medication needed.  

## 2019-07-04 NOTE — Assessment & Plan Note (Signed)
Continue weekly vit d 

## 2019-07-04 NOTE — Assessment & Plan Note (Signed)
  Patient re-educated about  the importance of commitment to a  minimum of 150 minutes of exercise per week as able.  The importance of healthy food choices with portion control discussed, as well as eating regularly and within a 12 hour window most days. The need to choose "clean , green" food 50 to 75% of the time is discussed, as well as to make water the primary drink and set a goal of 64 ounces water daily.    Weight /BMI 06/30/2019 03/31/2019 03/17/2019  WEIGHT 199 lb 197 lb 187 lb  HEIGHT 5\' 6"  5\' 6"  5\' 6"   BMI 32.12 kg/m2 31.8 kg/m2 30.18 kg/m2    Start half phentermine daily, plan 10 pound weight loss in 4 months

## 2019-07-04 NOTE — Assessment & Plan Note (Signed)
Hyperlipidemia:Low fat diet discussed and encouraged.   Lipid Panel  Lab Results  Component Value Date   CHOL 177 03/16/2019   HDL 44 (L) 03/16/2019   LDLCALC 119 (H) 03/16/2019   TRIG 58 03/16/2019   CHOLHDL 4.0 03/16/2019    not at goal Updated lab needed at/ before next visit.

## 2019-07-04 NOTE — Progress Notes (Signed)
Chelsey Lewis     MRN: [id number]      DOB: 1970/07/07   HPI Chelsey Lewis is here for follow up and re-evaluation of chronic medical conditions, medication management and review of any available recent lab and radiology data.  Preventive health is updated, specifically  Cancer screening and Immunization.   Questions or concerns regarding consultations or procedures which the PT has had in the interim are  addressed. The PT denies any adverse reactions to current medications since the last visit.  C/o weight gain , wants help with this, smoking again and unable/ unwilling to quitting currently   ROS Denies recent fever or chills. Denies sinus pressure, nasal congestion, ear pain or sore throat. Denies chest congestion, productive cough or wheezing. Denies chest pains, palpitations and leg swelling Denies abdominal pain, nausea, vomiting,diarrhea or constipation.   Denies dysuria, frequency, hesitancy or incontinence. Denies joint pain, swelling and limitation in mobility. Denies headaches, seizures, numbness, or tingling. Denies depression, anxiety or insomnia.takes medication for sleep Denies skin break down or rash.   PE  BP 133/71   Pulse 82   Temp (!) 97.4 F (36.3 C) (Temporal)   Resp 16   Ht 5\' 6"  (1.676 m)   Wt 199 lb (90.3 kg)   LMP 10/11/2015 (Approximate)   SpO2 98%   BMI 32.12 kg/m   Patient alert and oriented and in no cardiopulmonary distress.  HEENT: No facial asymmetry, EOMI,     Neck supple .  Chest: Clear to auscultation bilaterally.  CVS: S1, S2 no murmurs, no S3.Regular rate.  ABD: Soft non tender.   Ext: No edema  MS: Adequate ROM spine, shoulders, hips and knees.  Skin: Intact, no ulcerations or rash noted.  Psych: Good eye contact, normal affect. Memory intact not anxious or depressed appearing.  CNS: CN 2-12 intact, power,  normal throughout.no focal deficits noted.   Assessment & Plan  Essential hypertension Controlled, no change in  medication DASH diet and commitment to daily physical activity for a minimum of 30 minutes discussed and encouraged, as a part of hypertension management. The importance of attaining a healthy weight is also discussed.  BP/Weight 06/30/2019 03/31/2019 03/17/2019 01/03/2019 12/02/2018 11/22/2018 63/0/1601  Systolic BP 093 235 573 220 254 270 623  Diastolic BP 71 80 81 74 82 68 66  Wt. (Lbs) 199 197 187 187 182 - 183.04  BMI 32.12 31.8 30.18 30.18 30.29 - 30.46       Obesity (BMI 30.0-34.9)  Patient re-educated about  the importance of commitment to a  minimum of 150 minutes of exercise per week as able.  The importance of healthy food choices with portion control discussed, as well as eating regularly and within a 12 hour window most days. The need to choose "clean , green" food 50 to 75% of the time is discussed, as well as to make water the primary drink and set a goal of 64 ounces water daily.    Weight /BMI 06/30/2019 03/31/2019 03/17/2019  WEIGHT 199 lb 197 lb 187 lb  HEIGHT 5\' 6"  5\' 6"  5\' 6"   BMI 32.12 kg/m2 31.8 kg/m2 30.18 kg/m2    Start half phentermine daily, plan 10 pound weight loss in 4 months  Nicotine dependence Asked:confirms currently smokes cigarettes Assess: Unwilling to quit but cutting back Advise: needs to QUIT to reduce risk of cancer, cardio and cerebrovascular disease Assist: counseled for 5 minutes and literature provided Arrange: follow up in 3 months   Dyslipidemia, goal  LDL below 100 Hyperlipidemia:Low fat diet discussed and encouraged.   Lipid Panel  Lab Results  Component Value Date   CHOL 177 03/16/2019   HDL 44 (L) 03/16/2019   LDLCALC 119 (H) 03/16/2019   TRIG 58 03/16/2019   CHOLHDL 4.0 03/16/2019    not at goal Updated lab needed at/ before next visit.     Allergic rhinitis Controlled with as needed medication  Insomnia Sleep hygiene reviewed and written information offered also. Prescription sent for  medication  needed.   Vitamin D deficiency Continue weekly vit d

## 2019-07-04 NOTE — Assessment & Plan Note (Signed)
Controlled, no change in medication DASH diet and commitment to daily physical activity for a minimum of 30 minutes discussed and encouraged, as a part of hypertension management. The importance of attaining a healthy weight is also discussed.  BP/Weight 06/30/2019 03/31/2019 03/17/2019 01/03/2019 12/02/2018 11/22/2018 82/07/784  Systolic BP 754 492 010 071 219 758 832  Diastolic BP 71 80 81 74 82 68 66  Wt. (Lbs) 199 197 187 187 182 - 183.04  BMI 32.12 31.8 30.18 30.18 30.29 - 30.46

## 2019-07-04 NOTE — Assessment & Plan Note (Signed)
Controlled with as needed medication

## 2019-07-08 ENCOUNTER — Other Ambulatory Visit: Payer: Self-pay | Admitting: Family Medicine

## 2019-07-08 ENCOUNTER — Ambulatory Visit
Admission: RE | Admit: 2019-07-08 | Discharge: 2019-07-08 | Disposition: A | Discharge status: Home / Self Care | Payer: BC Managed Care – PPO | Source: Ambulatory Visit | Attending: Family Medicine | Admitting: Family Medicine

## 2019-07-08 ENCOUNTER — Other Ambulatory Visit: Payer: Self-pay

## 2019-07-08 DIAGNOSIS — Z1231 Encounter for screening mammogram for malignant neoplasm of breast: Secondary | ICD-10-CM

## 2019-07-14 ENCOUNTER — Encounter: Payer: Self-pay | Admitting: Family Medicine

## 2019-07-23 ENCOUNTER — Other Ambulatory Visit: Payer: Self-pay | Admitting: Family Medicine

## 2019-08-12 ENCOUNTER — Other Ambulatory Visit: Payer: Self-pay

## 2019-08-12 ENCOUNTER — Ambulatory Visit
Admission: EM | Admit: 2019-08-12 | Discharge: 2019-08-12 | Disposition: A | Discharge status: Home / Self Care | Payer: BC Managed Care – PPO | Attending: Family Medicine | Admitting: Family Medicine

## 2019-08-12 DIAGNOSIS — Z1152 Encounter for screening for COVID-19: Secondary | ICD-10-CM | POA: Diagnosis not present

## 2019-08-12 DIAGNOSIS — J3489 Other specified disorders of nose and nasal sinuses: Secondary | ICD-10-CM

## 2019-08-12 DIAGNOSIS — R519 Headache, unspecified: Secondary | ICD-10-CM

## 2019-08-12 DIAGNOSIS — R42 Dizziness and giddiness: Secondary | ICD-10-CM | POA: Diagnosis not present

## 2019-08-12 DIAGNOSIS — R5383 Other fatigue: Secondary | ICD-10-CM

## 2019-08-12 MED ORDER — CETIRIZINE-PSEUDOEPHEDRINE ER 5-120 MG PO TB12: 1.0000 | ORAL_TABLET | Freq: Every day | ORAL | 0 refills | Status: DC

## 2019-08-12 MED ORDER — MECLIZINE HCL 12.5 MG PO TABS: 12.5000 mg | ORAL_TABLET | Freq: Three times a day (TID) | ORAL | 0 refills | Status: DC | PRN

## 2019-08-12 NOTE — ED Provider Notes (Signed)
[place]   [id number] 08/12/19 Arrival Time: 1308   CC: COVID symptoms  SUBJECTIVE: History from: patient.  Chelsey Lewis is a 49 y.o. female who presents with abrupt onset of nasal congestion, sinus pain, headaches and dizzines for the last 3 days. Denies sick exposure to COVID, flu or strep. No hx Covid. Has had Covid vaccines. Denies recent travel. Has not taken OTC medications for this. There are no aggravating symptoms. Hx HTN, controlled with medications. Denies previous symptoms in the past. Denies fever, chills, fatigue, rhinorrhea, sore throat, SOB, wheezing, chest pain, nausea, changes in bowel or bladder habits.    ROS: As per HPI.  All other pertinent ROS negative.     Past Medical History:  Diagnosis Date  . Anemia   . Constipation   . DUB (dysfunctional uterine bleeding) 05/05/2012  . Hot flashes 02/23/2015  . Hyperlipidemia   . Hypertension   . Irregular periods/menstrual cycles 02/23/2015  . Migraine headache   . Overweight(278.02)   . Peri-menopausal 05/12/2012  . Peri-menopause 02/23/2015  . Postmenopausal 03/02/2015  . Tobacco abuse   . Tubal pregnancy   . Vaginal discharge   . Well adult exam    Past Surgical History:  Procedure Laterality Date  . 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 COLONOSCOPY WITH PROPOFOL N/A 11/22/2018   Procedure: COLONOSCOPY WITH PROPOFOL;  Surgeon: Daneil Dolin, MD;  Location: [facility];  Service: Endoscopy;  Laterality: N/A;  7:30am  . ECTOPIC PREGNANCY SURGERY    . oopherectomy and tubal ligation    . POLYPECTOMY  11/22/2018   Procedure: POLYPECTOMY;  Surgeon: Daneil Dolin, MD;  Location: [facility];  Service: Endoscopy;;  . TUBAL LIGATION     No Known Allergies No current facility-administered medications on file prior to encounter.   Current Outpatient Medications on File Prior to Encounter  Medication Sig Dispense Refill  . amLODipine (NORVASC) 5 MG  tablet TAKE 1 TABLET AT 9 PM EVERY NIGHT FOR BLOOD PRESSURE 90 tablet 1  . azelastine (ASTELIN) 0.1 % nasal spray Place 2 sprays into both nostrils 2 (two) times daily. Use in each nostril as directed 30 mL 12  . ergocalciferol (VITAMIN D2) 1.25 MG (50000 UT) capsule Take 1 capsule (50,000 Units total) by mouth once a week. One capsule once weekly 12 capsule 2  . FLUoxetine (PROZAC) 20 MG capsule TAKE 1 CAPSULE BY MOUTH EVERY DAY 90 capsule 1  . naproxen (NAPROSYN) 500 MG tablet Take 1 tablet (500 mg total) by mouth 2 (two) times daily with a meal. 60 tablet 5  . phentermine (ADIPEX-P) 37.5 MG tablet Take 1/2 tablet by mouth once daily every morning before breakfast 30 tablet 1  . rosuvastatin (CRESTOR) 5 MG tablet TAKE 1 TABLET BY MOUTH EVERY DAY 90 tablet 3  . temazepam (RESTORIL) 30 MG capsule Take 1 capsule (30 mg total) by mouth at bedtime. 30 capsule 5   Social History   Socioeconomic History  . Marital status: Married    Spouse name: Not on file  . Number of children: 2  . Years of education: Not on file  . Highest education level: Not on file  Occupational History  . Occupation: Risk analyst   Tobacco Use  . Smoking status: Current Some Day Smoker    Packs/day: 0.50    Years: 21.00    Pack years: 10.50    Types: Cigarettes  Last attempt to quit: 07/12/2017    Years since quitting: 2.0  . Smokeless tobacco: Never Used  Vaping Use  . Vaping Use: Never used  Substance and Sexual Activity  . Alcohol use: No    Alcohol/week: 0.0 standard drinks  . Drug use: No  . Sexual activity: Yes    Birth control/protection: Surgical    Comment: tubal  Other Topics Concern  . Not on file  Social History Narrative  . Not on file   Social Determinants of Health   Financial Resource Strain:   . Difficulty of Paying Living Expenses:   Food Insecurity:   . Worried About Charity fundraiser in the Last Year:   . Arboriculturist in the Last Year:   Transportation Needs:   . Consulting civil engineer (Medical):   Marland Kitchen Lack of Transportation (Non-Medical):   Physical Activity:   . Days of Exercise per Week:   . Minutes of Exercise per Session:   Stress:   . Feeling of Stress :   Social Connections:   . Frequency of Communication with Friends and Family:   . Frequency of Social Gatherings with Friends and Family:   . Attends Religious Services:   . Active Member of Clubs or Organizations:   . Attends Archivist Meetings:   Marland Kitchen Marital Status:   Intimate Partner Violence:   . Fear of Current or Ex-Partner:   . Emotionally Abused:   Marland Kitchen Physically Abused:   . Sexually Abused:    Family History  Problem Relation Age of Onset  . Coronary artery disease Father        with CABG   . COPD Mother   . Diabetes Mother   . Hypertension Mother   . Hypertension Sister   . Cancer Sister 2       uterine  . Hypertension Sister   . Dementia Maternal Grandmother   . Cancer Maternal Grandmother        breast and  lung  . Colon cancer Neg Hx   . Colon polyps Neg Hx     OBJECTIVE:  Vitals:   08/12/19 1337  BP: 121/80  Pulse: 79  Resp: 16  Temp: 98.1 F (36.7 C)  TempSrc: Oral  SpO2: 98%     General appearance: alert; appears fatigued, but nontoxic; speaking in full sentences and tolerating own secretions HEENT: NCAT; Ears: EACs clear, TMs pearly gray; Eyes: PERRL.  EOM grossly intact. Sinuses: tender; Nose: nares patent without rhinorrhea, Throat: oropharynx clear, tonsils non erythematous or enlarged, uvula midline  Neck: supple without LAD Lungs: unlabored respirations, symmetrical air entry; cough: absent; no respiratory distress; CTAB Heart: regular rate and rhythm.  Radial pulses 2+ symmetrical bilaterally Skin: warm and dry Psychological: alert and cooperative; normal mood and affect  LABS:  No results found for this or any previous visit (from the past 24 hour(s)).   ASSESSMENT & PLAN:  1. Dizziness and giddiness   2. Sinus pressure   3.  Nonintractable headache, unspecified chronicity pattern, unspecified headache type   4. Encounter for screening for COVID-19   5. Other fatigue     Meds ordered this encounter  Medications  . cetirizine-pseudoephedrine (ZYRTEC-D) 5-120 MG tablet    Sig: Take 1 tablet by mouth daily.    Dispense:  30 tablet    Refill:  0    Order Specific Question:   Supervising Provider    Answer:   Chase Picket [id number]  .  meclizine (ANTIVERT) 12.5 MG tablet    Sig: Take 1 tablet (12.5 mg total) by mouth 3 (three) times daily as needed for dizziness.    Dispense:  30 tablet    Refill:  0    Order Specific Question:   Supervising Provider    Answer:   Chase Picket [id number]    Prescribed Zyrtec D for congestion and sinus pressure Prescribed meclizine for dizziness COVID testing ordered.  It will take between 1-2 days for test results.  Someone will contact you regarding abnormal results.    Patient should remain in quarantine until they have received Covid results.  If negative you may resume normal activities (go back to work/school) while practicing hand hygiene, social distance, and mask wearing.  If positive, patient should remain in quarantine for 10 days from symptom onset AND greater than 72 hours after symptoms resolution (absence of fever without the use of fever-reducing medication and improvement in respiratory symptoms), whichever is longer Get plenty of rest and push fluids Use zyrtec for nasal congestion, runny nose, and/or sore throat Use OTC flonase for nasal congestion and runny nose Use medications daily for symptom relief Use OTC medications like ibuprofen or tylenol as needed fever or pain Call or go to the ED if you have any new or worsening symptoms such as fever, worsening cough, shortness of breath, chest tightness, chest pain, turning blue, changes in mental status.  Reviewed expectations re: course of current medical issues. Questions answered. Outlined signs and  symptoms indicating need for more acute intervention. Patient verbalized understanding. After Visit Summary given.         Faustino Congress, NP 08/12/19 1408

## 2019-08-12 NOTE — Discharge Instructions (Addendum)
Your COVID test is pending.  You should self quarantine until the test result is back.    Take Tylenol as needed for fever or discomfort.  Rest and keep yourself hydrated.    Go to the emergency department if you develop acute worsening symptoms.     

## 2019-08-12 NOTE — ED Triage Notes (Signed)
Pt has been experiencing dizziness for past couple of days with movement

## 2019-08-13 LAB — NOVEL CORONAVIRUS, NAA: SARS-CoV-2, NAA: NOT DETECTED

## 2019-08-13 LAB — SARS-COV-2, NAA 2 DAY TAT

## 2019-08-15 ENCOUNTER — Encounter: Payer: Self-pay | Admitting: Family Medicine

## 2019-08-15 ENCOUNTER — Other Ambulatory Visit: Payer: Self-pay

## 2019-08-15 ENCOUNTER — Ambulatory Visit (INDEPENDENT_AMBULATORY_CARE_PROVIDER_SITE_OTHER): Payer: BC Managed Care – PPO | Admitting: Family Medicine

## 2019-08-15 VITALS — BP 123/80 | HR 63 | Temp 98.3°F | Resp 16 | Ht 66.0 in | Wt 201.1 lb

## 2019-08-15 DIAGNOSIS — I10 Essential (primary) hypertension: Secondary | ICD-10-CM | POA: Diagnosis not present

## 2019-08-15 DIAGNOSIS — H60392 Other infective otitis externa, left ear: Secondary | ICD-10-CM

## 2019-08-15 DIAGNOSIS — R42 Dizziness and giddiness: Secondary | ICD-10-CM | POA: Diagnosis not present

## 2019-08-15 DIAGNOSIS — E669 Obesity, unspecified: Secondary | ICD-10-CM

## 2019-08-15 DIAGNOSIS — F17218 Nicotine dependence, cigarettes, with other nicotine-induced disorders: Secondary | ICD-10-CM | POA: Diagnosis not present

## 2019-08-15 DIAGNOSIS — H6092 Unspecified otitis externa, left ear: Secondary | ICD-10-CM | POA: Insufficient documentation

## 2019-08-15 MED ORDER — AZITHROMYCIN 250 MG PO TABS
ORAL_TABLET | ORAL | 0 refills | Status: DC
Start: 2019-08-15 — End: 2019-11-07

## 2019-08-15 NOTE — Assessment & Plan Note (Signed)
Z-Pak x1

## 2019-08-15 NOTE — Assessment & Plan Note (Addendum)
Improving cut back Antivert to twice daily for 2 days and then once daily tomorrow with a return to work on August 4.  Call if remains significantly symptomatic.has been unable to work from 7/30

## 2019-08-15 NOTE — Patient Instructions (Addendum)
Follow-up as before call if you need me sooner.  Work excuse from 7/30 due to return in August 17, 2019.  Reduce meclizine to twice daily today and then take 1 tomorrow only if needed.  Z-Pack prescribed for left ear infection.  Congrats on quitting smoking for the last 2 weeks.  Call 1 800 quit now for help 24 hours a day 7 days a week free to assist with your quitting efforts.  After you complete the 21 mg patch start the 14 mg patch for 1 month and then go down to the 7 mg patch.  Do not smoke when using the patches.  It is important that you exercise regularly at least 30 minutes 5 times a week. If you develop chest pain, have severe difficulty breathing, or feel very tired, stop exercising immediately and seek medical attention  Think about what you will eat, plan ahead. Choose " clean, green, fresh or frozen" over canned, processed or packaged foods which are more sugary, salty and fatty. 70 to 75% of food eaten should be vegetables and fruit. Three meals at set times with snacks allowed between meals, but they must be fruit or vegetables. Aim to eat over a 12 hour period , example 7 am to 7 pm, and STOP after  your last meal of the day. Drink water,generally about 64 ounces per day, no other drink is as healthy. Fruit juice is best enjoyed in a healthy way, by EATING the fruit. Thanks for choosing [facility], we consider it a privelige to serve you.

## 2019-08-15 NOTE — Progress Notes (Signed)
Chelsey Lewis     MRN: [id number]      DOB: [birth year]   HPI Chelsey Lewis is here for follow up of recent ED visit 3 days ago. States she had light headedness and vertigo 1 week before she had to leave work this past friday, seen in [place] , treated with decongestant and meclizine , 70 % improvement\ No symptoms of infection, c/o left ear pressure, no fever , chills , sinus drainage or pressure, no preceding viral illness  ROS  Denies chest congestion, productive cough or wheezing. Denies chest pains, palpitations and leg swelling Denies abdominal pain, nausea, vomiting,diarrhea or constipation.   Denies dysuria, frequency, hesitancy or incontinence. Denies joint pain, swelling and limitation in mobility. Denies headaches, seizures, numbness, or tingling. Denies depression, anxiety or insomnia. Denies skin break down or rash.   PE  BP 123/80 (BP Location: Left Arm, Patient Position: Sitting, Cuff Size: Normal)   Pulse 63   Temp 98.3 F (36.8 C) (Oral)   Resp 16   Ht 5\' 6"  (1.676 m)   Wt 201 lb 1.9 oz (91.2 kg)   LMP 10/11/2015 (Approximate)   SpO2 98%   BMI 32.46 kg/m   Patient alert and oriented and in no cardiopulmonary distress.  HEENT: No facial asymmetry, EOMI,     Neck supple . Left TM clear, left outer ear  slightly erythematous, right tM clear No sinus tenderness, no nystagmus Chest: Clear to auscultation bilaterally.  CVS: S1, S2 no murmurs, no S3.Regular rate.  ABD: Soft non tender.   Ext: No edema  MS: Adequate ROM spine, shoulders, hips and knees.  Skin: Intact, no ulcerations or rash noted.  Psych: Good eye contact, normal affect. Memory intact not anxious or depressed appearing.  CNS: CN 2-12 intact, power,  normal throughout.no focal deficits noted.   Assessment & Plan  Vertigo Improving cut back Antivert to twice daily for 2 days and then once daily tomorrow with a return to work on August 4.  Call if remains significantly symptomatic.has been unable  to work from 7/30  Otitis externa, left Z-Pak x1  Nicotine dependence Quit x 2 weeks, she is to start nicotine replacement for the next 2 months to help her to abstain , 1800 quitnow number also provided  Essential hypertension Controlled, no change in medication DASH diet and commitment to daily physical activity for a minimum of 30 minutes discussed and encouraged, as a part of hypertension management. The importance of attaining a healthy weight is also discussed.  BP/Weight 08/15/2019 08/12/2019 06/30/2019 03/31/2019 03/17/2019 01/03/2019 46/65/9935  Systolic BP 701 779 390 300 923 300 762  Diastolic BP 80 80 71 80 81 74 82  Wt. (Lbs) 201.12 - 199 197 187 187 182  BMI 32.46 - 32.12 31.8 30.18 30.18 30.29       Obesity (BMI 30.0-34.9)  Patient re-educated about  the importance of commitment to a  minimum of 150 minutes of exercise per week as able.  The importance of healthy food choices with portion control discussed, as well as eating regularly and within a 12 hour window most days. The need to choose "clean , green" food 50 to 75% of the time is discussed, as well as to make water the primary drink and set a goal of 64 ounces water daily.    Weight /BMI 08/15/2019 06/30/2019 03/31/2019  WEIGHT 201 lb 1.9 oz 199 lb 197 lb  HEIGHT 5\' 6"  5\' 6"  5\' 6"   BMI 32.46 kg/m2 32.12  kg/m2 31.8 kg/m2

## 2019-08-16 ENCOUNTER — Other Ambulatory Visit: Payer: Self-pay | Admitting: Family Medicine

## 2019-08-16 ENCOUNTER — Encounter: Payer: Self-pay | Admitting: Family Medicine

## 2019-08-16 MED ORDER — FLUCONAZOLE 150 MG PO TABS
ORAL_TABLET | ORAL | 0 refills | Status: DC
Start: 2019-08-16 — End: 2019-11-07

## 2019-08-20 ENCOUNTER — Encounter: Payer: Self-pay | Admitting: Family Medicine

## 2019-08-20 NOTE — Assessment & Plan Note (Signed)
  Patient re-educated about  the importance of commitment to a  minimum of 150 minutes of exercise per week as able.  The importance of healthy food choices with portion control discussed, as well as eating regularly and within a 12 hour window most days. The need to choose "clean , green" food 50 to 75% of the time is discussed, as well as to make water the primary drink and set a goal of 64 ounces water daily.    Weight /BMI 08/15/2019 06/30/2019 03/31/2019  WEIGHT 201 lb 1.9 oz 199 lb 197 lb  HEIGHT 5\' 6"  5\' 6"  5\' 6"   BMI 32.46 kg/m2 32.12 kg/m2 31.8 kg/m2

## 2019-08-20 NOTE — Assessment & Plan Note (Signed)
Controlled, no change in medication DASH diet and commitment to daily physical activity for a minimum of 30 minutes discussed and encouraged, as a part of hypertension management. The importance of attaining a healthy weight is also discussed.  BP/Weight 08/15/2019 08/12/2019 06/30/2019 03/31/2019 03/17/2019 01/03/2019 25/67/2091  Systolic BP 980 221 798 102 548 628 241  Diastolic BP 80 80 71 80 81 74 82  Wt. (Lbs) 201.12 - 199 197 187 187 182  BMI 32.46 - 32.12 31.8 30.18 30.18 30.29

## 2019-08-20 NOTE — Assessment & Plan Note (Signed)
Quit x 2 weeks, she is to start nicotine replacement for the next 2 months to help her to abstain , 1800 quitnow number also provided

## 2019-09-02 ENCOUNTER — Ambulatory Visit
Admission: EM | Admit: 2019-09-02 | Discharge: 2019-09-02 | Disposition: A | Discharge status: Home / Self Care | Payer: BC Managed Care – PPO | Attending: Family Medicine | Admitting: Family Medicine

## 2019-09-02 ENCOUNTER — Encounter: Payer: Self-pay | Admitting: Emergency Medicine

## 2019-09-02 ENCOUNTER — Other Ambulatory Visit: Payer: Self-pay

## 2019-09-02 DIAGNOSIS — B349 Viral infection, unspecified: Secondary | ICD-10-CM | POA: Diagnosis not present

## 2019-09-02 DIAGNOSIS — R0981 Nasal congestion: Secondary | ICD-10-CM

## 2019-09-02 DIAGNOSIS — Z20822 Contact with and (suspected) exposure to covid-19: Secondary | ICD-10-CM | POA: Diagnosis not present

## 2019-09-02 DIAGNOSIS — R591 Generalized enlarged lymph nodes: Secondary | ICD-10-CM | POA: Diagnosis not present

## 2019-09-02 DIAGNOSIS — R062 Wheezing: Secondary | ICD-10-CM

## 2019-09-02 DIAGNOSIS — R519 Headache, unspecified: Secondary | ICD-10-CM

## 2019-09-02 DIAGNOSIS — Z1152 Encounter for screening for COVID-19: Secondary | ICD-10-CM

## 2019-09-02 MED ORDER — DEXAMETHASONE SODIUM PHOSPHATE 10 MG/ML IJ SOLN
10.0000 mg | Freq: Once | INTRAMUSCULAR | Status: AC
  Administered 2019-09-02: 10 mg via INTRAMUSCULAR

## 2019-09-02 MED ORDER — ALBUTEROL SULFATE HFA 108 (90 BASE) MCG/ACT IN AERS: 2.0000 | INHALATION_SPRAY | RESPIRATORY_TRACT | 0 refills | Status: DC | PRN

## 2019-09-02 NOTE — ED Provider Notes (Signed)
[facility]   [id number] 09/02/19 Arrival Time: 0803   CC: COVID symptoms  SUBJECTIVE: History from: patient.  Chelsey Lewis is a 49 y.o. female who presents with abrupt onset of nasal congestion, PND, headache, lymphadenopathy since yesterday. Reports that her granddaughter has Covid. Denies recent travel. Has negative history of Covid. Has not completed Covid vaccines. Has not taken OTC medications for this. There are no aggravating or alleviating factors. Denies previous symptoms in the past. Denies fever, chills, fatigue, sinus pain, rhinorrhea, sore throat, SOB, wheezing, chest pain, nausea, changes in bowel or bladder habits.    ROS: As per HPI.  All other pertinent ROS negative.     Past Medical History:  Diagnosis Date  . Anemia   . Constipation   . DUB (dysfunctional uterine bleeding) 05/05/2012  . Hot flashes 02/23/2015  . Hyperlipidemia   . Hypertension   . Irregular periods/menstrual cycles 02/23/2015  . Migraine headache   . Overweight(278.02)   . Peri-menopausal 05/12/2012  . Peri-menopause 02/23/2015  . Postmenopausal 03/02/2015  . Tobacco abuse   . Tubal pregnancy   . Vaginal discharge   . Well adult exam    Past Surgical History:  Procedure Laterality Date  . 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 COLONOSCOPY WITH PROPOFOL N/A 11/22/2018   Procedure: COLONOSCOPY WITH PROPOFOL;  Surgeon: Daneil Dolin, MD;  Location: [facility];  Service: Endoscopy;  Laterality: N/A;  7:30am  . ECTOPIC PREGNANCY SURGERY    . oopherectomy and tubal ligation    . POLYPECTOMY  11/22/2018   Procedure: POLYPECTOMY;  Surgeon: Daneil Dolin, MD;  Location: [facility];  Service: Endoscopy;;  . TUBAL LIGATION     No Known Allergies No current facility-administered medications on file prior to encounter.   Current Outpatient Medications on File Prior to Encounter  Medication Sig Dispense Refill  . amLODipine (NORVASC)  5 MG tablet TAKE 1 TABLET AT 9 PM EVERY NIGHT FOR BLOOD PRESSURE 90 tablet 1  . azelastine (ASTELIN) 0.1 % nasal spray Place 2 sprays into both nostrils 2 (two) times daily. Use in each nostril as directed 30 mL 12  . azithromycin (ZITHROMAX) 250 MG tablet Take 2 tablets on day 1, then take 1 tablet once daily for an additional 4 days 6 tablet 0  . cetirizine-pseudoephedrine (ZYRTEC-D) 5-120 MG tablet Take 1 tablet by mouth daily. 30 tablet 0  . ergocalciferol (VITAMIN D2) 1.25 MG (50000 UT) capsule Take 1 capsule (50,000 Units total) by mouth once a week. One capsule once weekly 12 capsule 2  . fluconazole (DIFLUCAN) 150 MG tablet Take one tablet once daily, as needed, for vaginal candidiasis associated with antibiotic use 2 tablet 0  . FLUoxetine (PROZAC) 20 MG capsule TAKE 1 CAPSULE BY MOUTH EVERY DAY 90 capsule 1  . meclizine (ANTIVERT) 12.5 MG tablet Take 1 tablet (12.5 mg total) by mouth 3 (three) times daily as needed for dizziness. 30 tablet 0  . naproxen (NAPROSYN) 500 MG tablet Take 1 tablet (500 mg total) by mouth 2 (two) times daily with a meal. (Patient not taking: Reported on 08/15/2019) 60 tablet 5  . phentermine (ADIPEX-P) 37.5 MG tablet Take 1/2 tablet by mouth once daily every morning before breakfast 30 tablet 1  . rosuvastatin (CRESTOR) 5 MG tablet TAKE 1 TABLET BY MOUTH EVERY DAY 90 tablet 3  . temazepam (RESTORIL) 30 MG capsule Take 1 capsule (30 mg  total) by mouth at bedtime. 30 capsule 5   Social History   Socioeconomic History  . Marital status: Married    Spouse name: Not on file  . Number of children: 2  . Years of education: Not on file  . Highest education level: Not on file  Occupational History  . Occupation: Risk analyst   Tobacco Use  . Smoking status: Former Smoker    Packs/day: 0.50    Years: 21.00    Pack years: 10.50    Types: Cigarettes    Quit date: 07/31/2019    Years since quitting: 0.0  . Smokeless tobacco: Never Used  Vaping Use  . Vaping  Use: Never used  Substance and Sexual Activity  . Alcohol use: No    Alcohol/week: 0.0 standard drinks  . Drug use: No  . Sexual activity: Yes    Birth control/protection: Surgical    Comment: tubal  Other Topics Concern  . Not on file  Social History Narrative  . Not on file   Social Determinants of Health   Financial Resource Strain:   . Difficulty of Paying Living Expenses: Not on file  Food Insecurity:   . Worried About Charity fundraiser in the Last Year: Not on file  . Ran Out of Food in the Last Year: Not on file  Transportation Needs:   . Lack of Transportation (Medical): Not on file  . Lack of Transportation (Non-Medical): Not on file  Physical Activity:   . Days of Exercise per Week: Not on file  . Minutes of Exercise per Session: Not on file  Stress:   . Feeling of Stress : Not on file  Social Connections:   . Frequency of Communication with Friends and Family: Not on file  . Frequency of Social Gatherings with Friends and Family: Not on file  . Attends Religious Services: Not on file  . Active Member of Clubs or Organizations: Not on file  . Attends Archivist Meetings: Not on file  . Marital Status: Not on file  Intimate Partner Violence:   . Fear of Current or Ex-Partner: Not on file  . Emotionally Abused: Not on file  . Physically Abused: Not on file  . Sexually Abused: Not on file   Family History  Problem Relation Age of Onset  . Coronary artery disease Father        with CABG   . COPD Mother   . Diabetes Mother   . Hypertension Mother   . Hypertension Sister   . Cancer Sister 76       uterine  . Hypertension Sister   . Dementia Maternal Grandmother   . Cancer Maternal Grandmother        breast and  lung  . Colon cancer Neg Hx   . Colon polyps Neg Hx     OBJECTIVE:  Vitals:   09/02/19 0813 09/02/19 0816  BP:  125/84  Pulse:  71  Resp:  18  Temp:  98.2 F (36.8 C)  TempSrc:  Oral  SpO2:  98%  Weight: 200 lb (90.7 kg)     Height: 5\' 6"  (1.676 m)      General appearance: alert; appears fatigued, but nontoxic; speaking in full sentences and tolerating own secretions HEENT: NCAT; Ears: EACs clear, TMs pearly gray; Eyes: PERRL.  EOM grossly intact. Sinuses: nontender; Nose: nares patent without rhinorrhea, Throat: oropharynx clear, tonsils non erythematous or enlarged, uvula midline  Neck: supple without LAD Lungs: unlabored respirations,  symmetrical air entry; cough: absent; no respiratory distress; wheezing in base of right lung Heart: regular rate and rhythm.  Radial pulses 2+ symmetrical bilaterally Skin: warm and dry Psychological: alert and cooperative; normal mood and affect  LABS:  No results found for this or any previous visit (from the past 24 hour(s)).   ASSESSMENT & PLAN:  1. Viral illness   2. Exposure to COVID-19 virus   3. Nonintractable headache, unspecified chronicity pattern, unspecified headache type   4. Lymphadenopathy   5. Nasal congestion   6. Encounter for screening for COVID-19   7. Wheezing     Meds ordered this encounter  Medications  . albuterol (VENTOLIN HFA) 108 (90 Base) MCG/ACT inhaler    Sig: Inhale 2 puffs into the lungs every 4 (four) hours as needed for wheezing or shortness of breath.    Dispense:  18 g    Refill:  0    Order Specific Question:   Supervising Provider    Answer:   Chase Picket [id number]  . dexamethasone (DECADRON) injection 10 mg    Decadron 10 mg IM in office today Prescribed albuterol prn  COVID testing ordered.  It will take between 1-2 days for test results.  Someone will contact you regarding abnormal results.    Patient should remain in quarantine until they have received Covid results.  If negative you may resume normal activities (go back to work/school) while practicing hand hygiene, social distance, and mask wearing.  If positive, patient should remain in quarantine for 10 days from symptom onset AND greater than 72 hours  after symptoms resolution (absence of fever without the use of fever-reducing medication and improvement in respiratory symptoms), whichever is longer Get plenty of rest and push fluids Use OTC zyrtec for nasal congestion, runny nose, and/or sore throat Use OTC flonase for nasal congestion and runny nose Use medications daily for symptom relief Use OTC medications like ibuprofen or tylenol as needed fever or pain Call or go to the ED if you have any new or worsening symptoms such as fever, worsening cough, shortness of breath, chest tightness, chest pain, turning blue, changes in mental status.  Reviewed expectations re: course of current medical issues. Questions answered. Outlined signs and symptoms indicating need for more acute intervention. Patient verbalized understanding. After Visit Summary given.         Faustino Congress, NP 09/02/19 [phone]

## 2019-09-02 NOTE — Discharge Instructions (Addendum)
Your COVID test is pending.  You should self quarantine until the test result is back.    Take Tylenol as needed for fever or discomfort.  Rest and keep yourself hydrated.    Go to the emergency department if you develop acute worsening symptoms.    I have prescribed an inhaler for you to use 2 puffs every 4 hours as needed for wheezing

## 2019-09-02 NOTE — ED Triage Notes (Signed)
Headache, sore neck and nasal congestion since yesterday. Pt's granddaughter recently dx with covid.

## 2019-09-03 LAB — NOVEL CORONAVIRUS, NAA: SARS-CoV-2, NAA: NOT DETECTED

## 2019-09-03 LAB — SARS-COV-2, NAA 2 DAY TAT

## 2019-09-09 ENCOUNTER — Other Ambulatory Visit: Payer: Self-pay

## 2019-09-09 ENCOUNTER — Ambulatory Visit (INDEPENDENT_AMBULATORY_CARE_PROVIDER_SITE_OTHER): Payer: BC Managed Care – PPO

## 2019-09-09 DIAGNOSIS — Z23 Encounter for immunization: Secondary | ICD-10-CM | POA: Diagnosis not present

## 2019-09-22 ENCOUNTER — Ambulatory Visit: Payer: BC Managed Care – PPO | Admitting: Family Medicine

## 2019-11-07 ENCOUNTER — Ambulatory Visit (INDEPENDENT_AMBULATORY_CARE_PROVIDER_SITE_OTHER): Payer: BC Managed Care – PPO | Admitting: Family Medicine

## 2019-11-07 ENCOUNTER — Encounter: Payer: Self-pay | Admitting: Family Medicine

## 2019-11-07 ENCOUNTER — Other Ambulatory Visit: Payer: Self-pay

## 2019-11-07 VITALS — BP 147/81 | HR 88 | Resp 16 | Ht 66.0 in | Wt 203.1 lb

## 2019-11-07 DIAGNOSIS — E66811 Obesity, class 1: Secondary | ICD-10-CM

## 2019-11-07 DIAGNOSIS — Z23 Encounter for immunization: Secondary | ICD-10-CM

## 2019-11-07 DIAGNOSIS — I10 Essential (primary) hypertension: Secondary | ICD-10-CM

## 2019-11-07 DIAGNOSIS — E669 Obesity, unspecified: Secondary | ICD-10-CM

## 2019-11-07 DIAGNOSIS — F3341 Major depressive disorder, recurrent, in partial remission: Secondary | ICD-10-CM

## 2019-11-07 DIAGNOSIS — E785 Hyperlipidemia, unspecified: Secondary | ICD-10-CM

## 2019-11-07 MED ORDER — AMLODIPINE BESYLATE 10 MG PO TABS: 10.0000 mg | ORAL_TABLET | Freq: Every day | ORAL | 3 refills | Status: DC

## 2019-11-07 MED ORDER — PHENTERMINE HCL 37.5 MG PO TABS: ORAL_TABLET | ORAL | 1 refills | Status: DC

## 2019-11-07 NOTE — Assessment & Plan Note (Signed)
Uncontrolled , increase to amlodipine 10 mg daily DASH diet and commitment to daily physical activity for a minimum of 30 minutes discussed and encouraged, as a part of hypertension management. The importance of attaining a healthy weight is also discussed.  BP/Weight 11/07/2019 09/02/2019 08/15/2019 08/12/2019 06/30/2019 4/88/8916 09/16/5036  Systolic BP 882 800 349 179 150 569 794  Diastolic BP 81 84 80 80 71 80 81  Wt. (Lbs) 203.08 200 201.12 - 199 197 187  BMI 32.78 32.28 32.46 - 32.12 31.8 30.18

## 2019-11-07 NOTE — Assessment & Plan Note (Signed)
Hyperlipidemia:Low fat diet discussed and encouraged.   Lipid Panel  Lab Results  Component Value Date   CHOL 177 03/16/2019   HDL 44 (L) 03/16/2019   LDLCALC 119 (H) 03/16/2019   TRIG 58 03/16/2019   CHOLHDL 4.0 03/16/2019     need  Updated lab

## 2019-11-07 NOTE — Assessment & Plan Note (Signed)
Controlled, no change in medication  

## 2019-11-07 NOTE — Progress Notes (Signed)
CHANNON BROUGHER     MRN: [id number]      DOB: 22-Apr-1970   HPI Ms. Virella is here for follow up and re-evaluation of chronic medical conditions, medication management and review of any available recent lab and radiology data.  Preventive health is updated, specifically  Cancer screening and Immunization.   Questions or concerns regarding consultations or procedures which the PT has had in the interim are  addressed. The PT denies any adverse reactions to current medications since the last visit.  There are no new concerns.  There are no specific complaints   ROS Denies recent fever or chills. Denies sinus pressure, nasal congestion, ear pain or sore throat. Denies chest congestion, productive cough or wheezing. Denies chest pains, palpitations and leg swelling Denies abdominal pain, nausea, vomiting,diarrhea or constipation.   Denies dysuria, frequency, hesitancy or incontinence. Denies joint pain, swelling and limitation in mobility. Denies headaches, seizures, numbness, or tingling. Denies depression, anxiety or insomnia. Denies skin break down or rash.   PE  BP (!) 147/81   Pulse 88   Resp 16   Ht 5\' 6"  (1.676 m)   Wt 203 lb 1.3 oz (92.1 kg)   LMP 10/11/2015 (Approximate)   SpO2 98%   BMI 32.78 kg/m   Patient alert and oriented and in no cardiopulmonary distress.  HEENT: No facial asymmetry, EOMI,     Neck supple .  Chest: Clear to auscultation bilaterally.  CVS: S1, S2 no murmurs, no S3.Regular rate.  ABD: Soft non tender.   Ext: No edema  MS: Adequate ROM spine, shoulders, hips and knees.  Skin: Intact, no ulcerations or rash noted.  Psych: Good eye contact, normal affect. Memory intact not anxious or depressed appearing.  CNS: CN 2-12 intact, power,  normal throughout.no focal deficits noted.   Assessment & Plan  Essential hypertension Uncontrolled , increase to amlodipine 10 mg daily DASH diet and commitment to daily physical activity for a minimum of  30 minutes discussed and encouraged, as a part of hypertension management. The importance of attaining a healthy weight is also discussed.  BP/Weight 11/07/2019 09/02/2019 08/15/2019 08/12/2019 06/30/2019 2/77/4128 07/20/6765  Systolic BP 209 470 962 836 629 476 546  Diastolic BP 81 84 80 80 71 80 81  Wt. (Lbs) 203.08 200 201.12 - 199 197 187  BMI 32.78 32.28 32.46 - 32.12 31.8 30.18       Depression, major, recurrent (HCC) Controlled, no change in medication   Obesity (BMI 30.0-34.9)  Patient re-educated about  the importance of commitment to a  minimum of 150 minutes of exercise per week as able.  The importance of healthy food choices with portion control discussed, as well as eating regularly and within a 12 hour window most days. The need to choose "clean , green" food 50 to 75% of the time is discussed, as well as to make water the primary drink and set a goal of 64 ounces water daily.    Weight /BMI 11/07/2019 09/02/2019 08/15/2019  WEIGHT 203 lb 1.3 oz 200 lb 201 lb 1.9 oz  HEIGHT 5\' 6"  5\' 6"  5\' 6"   BMI 32.78 kg/m2 32.28 kg/m2 32.46 kg/m2    Hold on phentermine until bP re evaluated in office and is normal  Dyslipidemia, goal LDL below 100 Hyperlipidemia:Low fat diet discussed and encouraged.   Lipid Panel  Lab Results  Component Value Date   CHOL 177 03/16/2019   HDL 44 (L) 03/16/2019   LDLCALC 119 (H) 03/16/2019  TRIG 58 03/16/2019   CHOLHDL 4.0 03/16/2019     need  Updated lab

## 2019-11-07 NOTE — Assessment & Plan Note (Signed)
  Patient re-educated about  the importance of commitment to a  minimum of 150 minutes of exercise per week as able.  The importance of healthy food choices with portion control discussed, as well as eating regularly and within a 12 hour window most days. The need to choose "clean , green" food 50 to 75% of the time is discussed, as well as to make water the primary drink and set a goal of 64 ounces water daily.    Weight /BMI 11/07/2019 09/02/2019 08/15/2019  WEIGHT 203 lb 1.3 oz 200 lb 201 lb 1.9 oz  HEIGHT 5\' 6"  5\' 6"  5\' 6"   BMI 32.78 kg/m2 32.28 kg/m2 32.46 kg/m2    Hold on phentermine until bP re evaluated in office and is normal

## 2019-11-07 NOTE — Patient Instructions (Signed)
Follow-up in office with MD first week in December to reevaluate blood pressure.  Call if you need me sooner.  TdAP in office today   New higher dose of amlodipine starting today 10 mg 1 tablet once daily.  Your blood pressure is uncontrolled    Do not resume phentermine until you have been reevaluated in the office again in December as your blood pressure is elevated.  Please change food choices as we discussed.  Stop sodas and all snacks please change to vegetable and fruit fresh or frozen drink only water.   Commit to 3 meals daily with healthy snacks between meals  It is important that you exercise regularly at least 30 minutes 5 times a week. If you develop chest pain, have severe difficulty breathing, or feel very tired, stop exercising immediately and seek medical attention   Think about what you will eat, plan ahead. Choose " clean, green, fresh or frozen" over canned, processed or packaged foods which are more sugary, salty and fatty. 70 to 75% of food eaten should be vegetables and fruit. Three meals at set times with snacks allowed between meals, but they must be fruit or vegetables. Aim to eat over a 12 hour period , example 7 am to 7 pm, and STOP after  your last meal of the day. Drink water,generally about 64 ounces per day, no other drink is as healthy. Fruit juice is best enjoyed in a healthy way, by EATING the fruit. Thanks for choosing [facility], we consider it a privelige to serve you.

## 2019-11-23 LAB — CBC
HCT: 42.5 % (ref 35.0–45.0)
Hemoglobin: 14.4 g/dL (ref 11.7–15.5)
MCH: 30.1 pg (ref 27.0–33.0)
MCHC: 33.9 g/dL (ref 32.0–36.0)
MCV: 88.9 fL (ref 80.0–100.0)
MPV: 9.5 fL (ref 7.5–12.5)
Platelets: 412 10*3/uL — ABNORMAL HIGH (ref 140–400)
RBC: 4.78 10*6/uL (ref 3.80–5.10)
RDW: 12.7 % (ref 11.0–15.0)
WBC: 5.4 10*3/uL (ref 3.8–10.8)

## 2019-11-23 LAB — COMPLETE METABOLIC PANEL WITH GFR
AG Ratio: 1.5 (calc) (ref 1.0–2.5)
ALT: 15 U/L (ref 6–29)
AST: 19 U/L (ref 10–35)
Albumin: 4.5 g/dL (ref 3.6–5.1)
Alkaline phosphatase (APISO): 72 U/L (ref 37–153)
BUN: 9 mg/dL (ref 7–25)
CO2: 28 mmol/L (ref 20–32)
Calcium: 11 mg/dL — ABNORMAL HIGH (ref 8.6–10.4)
Chloride: 107 mmol/L (ref 98–110)
Creat: 0.88 mg/dL (ref 0.50–1.05)
GFR, Est African American: 88 mL/min/{1.73_m2} (ref >=60)
GFR, Est Non African American: 76 mL/min/{1.73_m2} (ref >=60)
Globulin: 3 g/dL (calc) (ref 1.9–3.7)
Glucose, Bld: 92 mg/dL (ref 65–99)
Potassium: 4.1 mmol/L (ref 3.5–5.3)
Sodium: 139 mmol/L (ref 135–146)
Total Bilirubin: 0.4 mg/dL (ref 0.2–1.2)
Total Protein: 7.5 g/dL (ref 6.1–8.1)

## 2019-11-23 LAB — LIPID PANEL
Cholesterol: 225 mg/dL — ABNORMAL HIGH (ref <200)
HDL: 43 mg/dL — ABNORMAL LOW (ref >=50)
LDL Cholesterol (Calc): 163 mg/dL (calc) — ABNORMAL HIGH
Non-HDL Cholesterol (Calc): 182 mg/dL (calc) — ABNORMAL HIGH (ref <130)
Total CHOL/HDL Ratio: 5.2 (calc) — ABNORMAL HIGH (ref <5.0)
Triglycerides: 84 mg/dL (ref <150)

## 2019-11-23 LAB — HEPATITIS C ANTIBODY
Hepatitis C Ab: NONREACTIVE
SIGNAL TO CUT-OFF: 0.01 (ref <1.00)

## 2019-11-23 LAB — TSH: TSH: 1.55 mIU/L

## 2019-11-23 LAB — VITAMIN D 25 HYDROXY (VIT D DEFICIENCY, FRACTURES): Vit D, 25-Hydroxy: 22 ng/mL — ABNORMAL LOW (ref 30–100)

## 2019-12-21 ENCOUNTER — Ambulatory Visit: Payer: BC Managed Care – PPO | Admitting: Family Medicine

## 2019-12-29 ENCOUNTER — Ambulatory Visit: Payer: Self-pay | Admitting: Family Medicine

## 2020-01-14 ENCOUNTER — Other Ambulatory Visit: Payer: Self-pay | Admitting: Family Medicine

## 2020-01-18 ENCOUNTER — Ambulatory Visit (INDEPENDENT_AMBULATORY_CARE_PROVIDER_SITE_OTHER): Payer: Self-pay | Admitting: Family Medicine

## 2020-01-18 ENCOUNTER — Other Ambulatory Visit: Payer: Self-pay

## 2020-01-18 VITALS — BP 123/83 | HR 81 | Resp 15 | Ht 66.0 in | Wt 205.0 lb

## 2020-01-18 DIAGNOSIS — I10 Essential (primary) hypertension: Secondary | ICD-10-CM

## 2020-01-18 DIAGNOSIS — F3341 Major depressive disorder, recurrent, in partial remission: Secondary | ICD-10-CM

## 2020-01-18 DIAGNOSIS — E669 Obesity, unspecified: Secondary | ICD-10-CM

## 2020-01-18 DIAGNOSIS — E559 Vitamin D deficiency, unspecified: Secondary | ICD-10-CM

## 2020-01-18 DIAGNOSIS — E785 Hyperlipidemia, unspecified: Secondary | ICD-10-CM

## 2020-01-18 DIAGNOSIS — E66811 Obesity, class 1: Secondary | ICD-10-CM

## 2020-01-18 MED ORDER — ERGOCALCIFEROL 1.25 MG (50000 UT) PO CAPS: 50000.0000 [IU] | ORAL_CAPSULE | ORAL | 2 refills | Status: DC

## 2020-01-18 MED ORDER — PHENTERMINE HCL 37.5 MG PO TABS: 37.5000 mg | ORAL_TABLET | Freq: Every day | ORAL | 2 refills | Status: DC

## 2020-01-18 MED ORDER — TEMAZEPAM 30 MG PO CAPS: 30.0000 mg | ORAL_CAPSULE | Freq: Every evening | ORAL | 5 refills | Status: DC | PRN

## 2020-01-18 MED ORDER — AMLODIPINE BESYLATE 10 MG PO TABS: 10.0000 mg | ORAL_TABLET | Freq: Every day | ORAL | 1 refills | Status: DC

## 2020-01-18 NOTE — Patient Instructions (Addendum)
F/u in office in 10to 12 weeks, all if you need me sooner  Fasting lipid, cmp and EGFr 5 days before next visit  Excellent bP, no med change  It is important that you exercise regularly at least 30 minutes 5 times a week. If you develop chest pain, have severe difficulty breathing, or feel very tired, stop exercising immediately and seek medical attention    Think about what you will eat, plan ahead. Choose " clean, green, fresh or frozen" over canned, processed or packaged foods which are more sugary, salty and fatty. 70 to 75% of food eaten should be vegetables and fruit. Three meals at set times with snacks allowed between meals, but they must be fruit or vegetables. Aim to eat over a 12 hour period , example 7 am to 7 pm, and STOP after  your last meal of the day. Drink water,generally about 64 ounces per day, no other drink is as healthy. Fruit juice is best enjoyed in a healthy way, by EATING the fruit.   New is phentermine  One tablet once daily, also resume once weekly vit D please, this will be recked early Fall    Thanks for choosing [facility], we consider it a privelige to serve you. Target 1500 calories / day   Calorie Counting for Weight Loss Calories are units of energy. Your body needs a certain amount of calories from food to keep you going throughout the day. When you eat more calories than your body needs, your body stores the extra calories as fat. When you eat fewer calories than your body needs, your body burns fat to get the energy it needs. Calorie counting means keeping track of how many calories you eat and drink each day. Calorie counting can be helpful if you need to lose weight. If you make sure to eat fewer calories than your body needs, you should lose weight. Ask your health care provider what a healthy weight is for you. For calorie counting to work, you will need to eat the right number of calories in a day in order to lose a healthy amount  of weight per week. A dietitian can help you determine how many calories you need in a day and will give you suggestions on how to reach your calorie goal.  A healthy amount of weight to lose per week is usually 1-2 lb (0.5-0.9 kg). This usually means that your daily calorie intake should be reduced by 500-750 calories.  Eating 1,200 - 1,500 calories per day can help most women lose weight.  Eating 1,500 - 1,800 calories per day can help most men lose weight. What is my plan? My goal is to have __________ calories per day. If I have this many calories per day, I should lose around __________ pounds per week. What do I need to know about calorie counting? In order to meet your daily calorie goal, you will need to:  Find out how many calories are in each food you would like to eat. Try to do this before you eat.  Decide how much of the food you plan to eat.  Write down what you ate and how many calories it had. Doing this is called keeping a food log. To successfully lose weight, it is important to balance calorie counting with a healthy lifestyle that includes regular activity. Aim for 150 minutes of moderate exercise (such as walking) or 75 minutes of vigorous exercise (such as running) each week. Where do I  find calorie information?  The number of calories in a food can be found on a Nutrition Facts label. If a food does not have a Nutrition Facts label, try to look up the calories online or ask your dietitian for help. Remember that calories are listed per serving. If you choose to have more than one serving of a food, you will have to multiply the calories per serving by the amount of servings you plan to eat. For example, the label on a package of bread might say that a serving size is 1 slice and that there are 90 calories in a serving. If you eat 1 slice, you will have eaten 90 calories. If you eat 2 slices, you will have eaten 180 calories. How do I keep a food log? Immediately after  each meal, record the following information in your food log:  What you ate. Don't forget to include toppings, sauces, and other extras on the food.  How much you ate. This can be measured in cups, ounces, or number of items.  How many calories each food and drink had.  The total number of calories in the meal. Keep your food log near you, such as in a small notebook in your pocket, or use a mobile app or website. Some programs will calculate calories for you and show you how many calories you have left for the day to meet your goal. What are some calorie counting tips?   Use your calories on foods and drinks that will fill you up and not leave you hungry: ? Some examples of foods that fill you up are nuts and nut butters, vegetables, lean proteins, and high-fiber foods like whole grains. High-fiber foods are foods with more than 5 g fiber per serving. ? Drinks such as sodas, specialty coffee drinks, alcohol, and juices have a lot of calories, yet do not fill you up.  Eat nutritious foods and avoid empty calories. Empty calories are calories you get from foods or beverages that do not have many vitamins or protein, such as candy, sweets, and soda. It is better to have a nutritious high-calorie food (such as an avocado) than a food with few nutrients (such as a bag of chips).  Know how many calories are in the foods you eat most often. This will help you calculate calorie counts faster.  Pay attention to calories in drinks. Low-calorie drinks include water and unsweetened drinks.  Pay attention to nutrition labels for "low fat" or "fat free" foods. These foods sometimes have the same amount of calories or more calories than the full fat versions. They also often have added sugar, starch, or salt, to make up for flavor that was removed with the fat.  Find a way of tracking calories that works for you. Get creative. Try different apps or programs if writing down calories does not work for  you. What are some portion control tips?  Know how many calories are in a serving. This will help you know how many servings of a certain food you can have.  Use a measuring cup to measure serving sizes. You could also try weighing out portions on a kitchen scale. With time, you will be able to estimate serving sizes for some foods.  Take some time to put servings of different foods on your favorite plates, bowls, and cups so you know what a serving looks like.  Try not to eat straight from a bag or box. Doing this can lead to overeating.  Put the amount you would like to eat in a cup or on a plate to make sure you are eating the right portion.  Use smaller plates, glasses, and bowls to prevent overeating.  Try not to multitask (for example, watch TV or use your computer) while eating. If it is time to eat, sit down at a table and enjoy your food. This will help you to know when you are full. It will also help you to be aware of what you are eating and how much you are eating. What are tips for following this plan? Reading food labels  Check the calorie count compared to the serving size. The serving size may be smaller than what you are used to eating.  Check the source of the calories. Make sure the food you are eating is high in vitamins and protein and low in saturated and trans fats. Shopping  Read nutrition labels while you shop. This will help you make healthy decisions before you decide to purchase your food.  Make a grocery list and stick to it. Cooking  Try to cook your favorite foods in a healthier way. For example, try baking instead of frying.  Use low-fat dairy products. Meal planning  Use more fruits and vegetables. Half of your plate should be fruits and vegetables.  Include lean proteins like poultry and fish. How do I count calories when eating out?  Ask for smaller portion sizes.  Consider sharing an entree and sides instead of getting your own entree.  If  you get your own entree, eat only half. Ask for a box at the beginning of your meal and put the rest of your entree in it so you are not tempted to eat it.  If calories are listed on the menu, choose the lower calorie options.  Choose dishes that include vegetables, fruits, whole grains, low-fat dairy products, and lean protein.  Choose items that are boiled, broiled, grilled, or steamed. Stay away from items that are buttered, battered, fried, or served with cream sauce. Items labeled "crispy" are usually fried, unless stated otherwise.  Choose water, low-fat milk, unsweetened iced tea, or other drinks without added sugar. If you want an alcoholic beverage, choose a lower calorie option such as a glass of wine or light beer.  Ask for dressings, sauces, and syrups on the side. These are usually high in calories, so you should limit the amount you eat.  If you want a salad, choose a garden salad and ask for grilled meats. Avoid extra toppings like bacon, cheese, or fried items. Ask for the dressing on the side, or ask for olive oil and vinegar or lemon to use as dressing.  Estimate how many servings of a food you are given. For example, a serving of cooked rice is  cup or about the size of half a baseball. Knowing serving sizes will help you be aware of how much food you are eating at restaurants. The list below tells you how big or small some common portion sizes are based on everyday objects: ? 1 oz-4 stacked dice. ? 3 oz-1 deck of cards. ? 1 tsp-1 die. ? 1 Tbsp- a ping-pong ball. ? 2 Tbsp-1 ping-pong ball. ?  cup- baseball. ? 1 cup-1 baseball. Summary  Calorie counting means keeping track of how many calories you eat and drink each day. If you eat fewer calories than your body needs, you should lose weight.  A healthy amount of weight to lose per week is  usually 1-2 lb (0.5-0.9 kg). This usually means reducing your daily calorie intake by 500-750 calories.  The number of calories in  a food can be found on a Nutrition Facts label. If a food does not have a Nutrition Facts label, try to look up the calories online or ask your dietitian for help.  Use your calories on foods and drinks that will fill you up, and not on foods and drinks that will leave you hungry.  Use smaller plates, glasses, and bowls to prevent overeating. This information is not intended to replace advice given to you by your health care provider. Make sure you discuss any questions you have with your health care provider. Document Revised: 09/18/2017 Document Reviewed: 11/30/2015 Elsevier Patient Education  [place].

## 2020-01-21 ENCOUNTER — Encounter: Payer: Self-pay | Admitting: Family Medicine

## 2020-01-21 NOTE — Assessment & Plan Note (Signed)
Controlled, no change in medication  

## 2020-01-21 NOTE — Assessment & Plan Note (Signed)
  Patient re-educated about  the importance of commitment to a  minimum of 150 minutes of exercise per week as able.  The importance of healthy food choices with portion control discussed, as well as eating regularly and within a 12 hour window most days. The need to choose "clean , green" food 50 to 75% of the time is discussed, as well as to make water the primary drink and set a goal of 64 ounces water daily.    Weight /BMI 01/18/2020 11/07/2019 09/02/2019  WEIGHT 205 lb 203 lb 1.3 oz 200 lb  HEIGHT 5\' 6"  5\' 6"  5\' 6"   BMI 33.09 kg/m2 32.78 kg/m2 32.28 kg/m2   Start once daily phentermine

## 2020-01-21 NOTE — Assessment & Plan Note (Signed)
Controlled, no change in medication DASH diet and commitment to daily physical activity for a minimum of 30 minutes discussed and encouraged, as a part of hypertension management. The importance of attaining a healthy weight is also discussed.  BP/Weight 01/18/2020 11/07/2019 09/02/2019 08/15/2019 08/12/2019 06/30/2019 06/29/735  Systolic BP 106 269 485 462 703 500 938  Diastolic BP 83 81 84 80 80 71 80  Wt. (Lbs) 205 203.08 200 201.12 - 199 197  BMI 33.09 32.78 32.28 32.46 - 32.12 31.8

## 2020-01-21 NOTE — Assessment & Plan Note (Signed)
Hyperlipidemia:Low fat diet discussed and encouraged.   Lipid Panel  Lab Results  Component Value Date   CHOL 225 (H) 11/22/2019   HDL 43 (L) 11/22/2019   LDLCALC 163 (H) 11/22/2019   TRIG 84 11/22/2019   CHOLHDL 5.2 (H) 11/22/2019   Updated lab needed at/ before next visit.

## 2020-01-21 NOTE — Assessment & Plan Note (Signed)
Needs to commit to weekly supplement 

## 2020-01-21 NOTE — Progress Notes (Signed)
Chelsey Lewis     MRN: [id number]      DOB: 08-03-1970   HPI Chelsey Lewis is here for follow up and re-evaluation of chronic medical conditions, medication management and review of any available recent lab and radiology data.  Preventive health is updated, specifically  Cancer screening and Immunization.   Questions or concerns regarding consultations or procedures which the PT has had in the interim are  addressed. The PT denies any adverse reactions to current medications since the last visit.  There are no new concerns.  There are no specific complaints   ROS Denies recent fever or chills. Denies sinus pressure, nasal congestion, ear pain or sore throat. Denies chest congestion, productive cough or wheezing. Denies chest pains, palpitations and leg swelling Denies abdominal pain, nausea, vomiting,diarrhea or constipation.   Denies dysuria, frequency, hesitancy or incontinence. Denies joint pain, swelling and limitation in mobility. Denies headaches, seizures, numbness, or tingling. Denies uncontrolled depression, anxiety or insomnia. Denies skin break down or rash.   PE  BP 123/83   Pulse 81   Resp 15   Ht 5\' 6"  (1.676 m)   Wt 205 lb (93 kg)   LMP 10/11/2015 (Approximate)   SpO2 100%   BMI 33.09 kg/m   Patient alert and oriented and in no cardiopulmonary distress.  HEENT: No facial asymmetry, EOMI,     Neck supple .  Chest: Clear to auscultation bilaterally.  CVS: S1, S2 no murmurs, no S3.Regular rate.  ABD: Soft non tender.   Ext: No edema  MS: Adequate ROM spine, shoulders, hips and knees.  Skin: Intact, no ulcerations or rash noted.  Psych: Good eye contact, normal affect. Memory intact not anxious or depressed appearing.  CNS: CN 2-12 intact, power,  normal throughout.no focal deficits noted.   Assessment & Plan  Obesity (BMI 30.0-34.9)  Patient re-educated about  the importance of commitment to a  minimum of 150 minutes of exercise per week as  able.  The importance of healthy food choices with portion control discussed, as well as eating regularly and within a 12 hour window most days. The need to choose "clean , green" food 50 to 75% of the time is discussed, as well as to make water the primary drink and set a goal of 64 ounces water daily.    Weight /BMI 01/18/2020 11/07/2019 09/02/2019  WEIGHT 205 lb 203 lb 1.3 oz 200 lb  HEIGHT 5\' 6"  5\' 6"  5\' 6"   BMI 33.09 kg/m2 32.78 kg/m2 32.28 kg/m2   Start once daily phentermine   Depression, major, recurrent (HCC) Controlled, no change in medication   Essential hypertension Controlled, no change in medication DASH diet and commitment to daily physical activity for a minimum of 30 minutes discussed and encouraged, as a part of hypertension management. The importance of attaining a healthy weight is also discussed.  BP/Weight 01/18/2020 11/07/2019 09/02/2019 08/15/2019 08/12/2019 06/30/2019 07/04/2977  Systolic BP 892 119 417 408 144 818 563  Diastolic BP 83 81 84 80 80 71 80  Wt. (Lbs) 205 203.08 200 201.12 - 199 197  BMI 33.09 32.78 32.28 32.46 - 32.12 31.8       Dyslipidemia, goal LDL below 100 Hyperlipidemia:Low fat diet discussed and encouraged.   Lipid Panel  Lab Results  Component Value Date   CHOL 225 (H) 11/22/2019   HDL 43 (L) 11/22/2019   LDLCALC 163 (H) 11/22/2019   TRIG 84 11/22/2019   CHOLHDL 5.2 (H) 11/22/2019   Updated lab  needed at/ before next visit.    Vitamin D deficiency Needs to commit to weekly supplement

## 2020-01-31 ENCOUNTER — Other Ambulatory Visit: Payer: Self-pay | Admitting: Family Medicine

## 2020-03-20 ENCOUNTER — Other Ambulatory Visit (HOSPITAL_COMMUNITY): Payer: Self-pay | Admitting: Family Medicine

## 2020-03-20 ENCOUNTER — Telehealth (INDEPENDENT_AMBULATORY_CARE_PROVIDER_SITE_OTHER): Payer: Managed Care, Other (non HMO) | Admitting: Family Medicine

## 2020-03-20 ENCOUNTER — Other Ambulatory Visit: Payer: Self-pay

## 2020-03-20 ENCOUNTER — Encounter: Payer: Self-pay | Admitting: Family Medicine

## 2020-03-20 VITALS — BP 124/88 | HR 97 | Ht 66.0 in | Wt 197.0 lb

## 2020-03-20 DIAGNOSIS — J3089 Other allergic rhinitis: Secondary | ICD-10-CM | POA: Diagnosis not present

## 2020-03-20 DIAGNOSIS — E66811 Obesity, class 1: Secondary | ICD-10-CM

## 2020-03-20 DIAGNOSIS — Z1231 Encounter for screening mammogram for malignant neoplasm of breast: Secondary | ICD-10-CM

## 2020-03-20 DIAGNOSIS — F3341 Major depressive disorder, recurrent, in partial remission: Secondary | ICD-10-CM

## 2020-03-20 DIAGNOSIS — E669 Obesity, unspecified: Secondary | ICD-10-CM | POA: Diagnosis not present

## 2020-03-20 DIAGNOSIS — I10 Essential (primary) hypertension: Secondary | ICD-10-CM | POA: Diagnosis not present

## 2020-03-20 MED ORDER — AZELASTINE HCL 0.1 % NA SOLN: 2.0000 | Freq: Two times a day (BID) | NASAL | 12 refills | Status: DC

## 2020-03-20 MED ORDER — PHENTERMINE HCL 37.5 MG PO TABS: 37.5000 mg | ORAL_TABLET | Freq: Every day | ORAL | 2 refills | Status: DC

## 2020-03-20 NOTE — Patient Instructions (Addendum)
F/U in 3 months, call if you need me before  Please get mammogram scheduled in June when due, at [facility] on great weight loss, keep it up!  Weight loss goal is 10 to 12 pounds  Astelin is prescribed for allergies  It is important that you exercise regularly at least 30 minutes 5 times a week. If you develop chest pain, have severe difficulty breathing, or feel very tired, stop exercising immediately and seek medical attention    No medication changes  Thanks for choosing [facility], we consider it a privelige to serve you.     Calorie Counting for Weight Loss Calories are units of energy. Your body needs a certain number of calories from food to keep going throughout the day. When you eat or drink more calories than your body needs, your body stores the extra calories mostly as fat. When you eat or drink fewer calories than your body needs, your body burns fat to get the energy it needs. Calorie counting means keeping track of how many calories you eat and drink each day. Calorie counting can be helpful if you need to lose weight. If you eat fewer calories than your body needs, you should lose weight. Ask your health care provider what a healthy weight is for you. For calorie counting to work, you will need to eat the right number of calories each day to lose a healthy amount of weight per week. A dietitian can help you figure out how many calories you need in a day and will suggest ways to reach your calorie goal.  A healthy amount of weight to lose each week is usually 1-2 lb (0.5-0.9 kg). This usually means that your daily calorie intake should be reduced by 500-750 calories.  Eating 1,200-1,500 calories a day can help most women lose weight.  Eating 1,500-1,800 calories a day can help most men lose weight. What do I need to know about calorie counting? Work with your health care provider or dietitian to determine how many calories you should get each day. To  meet your daily calorie goal, you will need to:  Find out how many calories are in each food that you would like to eat. Try to do this before you eat.  Decide how much of the food you plan to eat.  Keep a food log. Do this by writing down what you ate and how many calories it had. To successfully lose weight, it is important to balance calorie counting with a healthy lifestyle that includes regular activity. Where do I find calorie information? The number of calories in a food can be found on a Nutrition Facts label. If a food does not have a Nutrition Facts label, try to look up the calories online or ask your dietitian for help. Remember that calories are listed per serving. If you choose to have more than one serving of a food, you will have to multiply the calories per serving by the number of servings you plan to eat. For example, the label on a package of bread might say that a serving size is 1 slice and that there are 90 calories in a serving. If you eat 1 slice, you will have eaten 90 calories. If you eat 2 slices, you will have eaten 180 calories.   How do I keep a food log? After each time that you eat, record the following in your food log as soon as possible:  What you ate. Be sure  to include toppings, sauces, and other extras on the food.  How much you ate. This can be measured in cups, ounces, or number of items.  How many calories were in each food and drink.  The total number of calories in the food you ate. Keep your food log near you, such as in a pocket-sized notebook or on an app or website on your mobile phone. Some programs will calculate calories for you and show you how many calories you have left to meet your daily goal. What are some portion-control tips?  Know how many calories are in a serving. This will help you know how many servings you can have of a certain food.  Use a measuring cup to measure serving sizes. You could also try weighing out portions on a  kitchen scale. With time, you will be able to estimate serving sizes for some foods.  Take time to put servings of different foods on your favorite plates or in your favorite bowls and cups so you know what a serving looks like.  Try not to eat straight from a food's packaging, such as from a bag or box. Eating straight from the package makes it hard to see how much you are eating and can lead to overeating. Put the amount you would like to eat in a cup or on a plate to make sure you are eating the right portion.  Use smaller plates, glasses, and bowls for smaller portions and to prevent overeating.  Try not to multitask. For example, avoid watching TV or using your computer while eating. If it is time to eat, sit down at a table and enjoy your food. This will help you recognize when you are full. It will also help you be more mindful of what and how much you are eating. What are tips for following this plan? Reading food labels  Check the calorie count compared with the serving size. The serving size may be smaller than what you are used to eating.  Check the source of the calories. Try to choose foods that are high in protein, fiber, and vitamins, and low in saturated fat, trans fat, and sodium. Shopping  Read nutrition labels while you shop. This will help you make healthy decisions about which foods to buy.  Pay attention to nutrition labels for low-fat or fat-free foods. These foods sometimes have the same number of calories or more calories than the full-fat versions. They also often have added sugar, starch, or salt to make up for flavor that was removed with the fat.  Make a grocery list of lower-calorie foods and stick to it. Cooking  Try to cook your favorite foods in a healthier way. For example, try baking instead of frying.  Use low-fat dairy products. Meal planning  Use more fruits and vegetables. One-half of your plate should be fruits and vegetables.  Include lean  proteins, such as chicken, [place], and fish. Lifestyle Each week, aim to do one of the following:  150 minutes of moderate exercise, such as walking.  75 minutes of vigorous exercise, such as running. General information  Know how many calories are in the foods you eat most often. This will help you calculate calorie counts faster.  Find a way of tracking calories that works for you. Get creative. Try different apps or programs if writing down calories does not work for you. What foods should I eat?  Eat nutritious foods. It is better to have a nutritious, high-calorie food,  such as an avocado, than a food with few nutrients, such as a bag of potato chips.  Use your calories on foods and drinks that will fill you up and will not leave you hungry soon after eating. ? Examples of foods that fill you up are nuts and nut butters, vegetables, lean proteins, and high-fiber foods such as whole grains. High-fiber foods are foods with more than 5 g of fiber per serving.  Pay attention to calories in drinks. Low-calorie drinks include water and unsweetened drinks. The items listed above may not be a complete list of foods and beverages you can eat. Contact a dietitian for more information.   What foods should I limit? Limit foods or drinks that are not good sources of vitamins, minerals, or protein or that are high in unhealthy fats. These include:  Candy.  Other sweets.  Sodas, specialty coffee drinks, alcohol, and juice. The items listed above may not be a complete list of foods and beverages you should avoid. Contact a dietitian for more information. How do I count calories when eating out?  Pay attention to portions. Often, portions are much larger when eating out. Try these tips to keep portions smaller: ? Consider sharing a meal instead of getting your own. ? If you get your own meal, eat only half of it. Before you start eating, ask for a container and put half of your meal into  it. ? When available, consider ordering smaller portions from the menu instead of full portions.  Pay attention to your food and drink choices. Knowing the way food is cooked and what is included with the meal can help you eat fewer calories. ? If calories are listed on the menu, choose the lower-calorie options. ? Choose dishes that include vegetables, fruits, whole grains, low-fat dairy products, and lean proteins. ? Choose items that are boiled, broiled, grilled, or steamed. Avoid items that are buttered, battered, fried, or served with cream sauce. Items labeled as crispy are usually fried, unless stated otherwise. ? Choose water, low-fat milk, unsweetened iced tea, or other drinks without added sugar. If you want an alcoholic beverage, choose a lower-calorie option, such as a glass of wine or light beer. ? Ask for dressings, sauces, and syrups on the side. These are usually high in calories, so you should limit the amount you eat. ? If you want a salad, choose a garden salad and ask for grilled meats. Avoid extra toppings such as bacon, cheese, or fried items. Ask for the dressing on the side, or ask for olive oil and vinegar or lemon to use as dressing.  Estimate how many servings of a food you are given. Knowing serving sizes will help you be aware of how much food you are eating at restaurants. Where to find more information  Centers for Disease Control and Prevention: http://www.wolf.info/  [place] Department of Agriculture: http://www.wilson-mendoza.org/ Summary  Calorie counting means keeping track of how many calories you eat and drink each day. If you eat fewer calories than your body needs, you should lose weight.  A healthy amount of weight to lose per week is usually 1-2 lb (0.5-0.9 kg). This usually means reducing your daily calorie intake by 500-750 calories.  The number of calories in a food can be found on a Nutrition Facts label. If a food does not have a Nutrition Facts label, try to look up the  calories online or ask your dietitian for help.  Use smaller plates, glasses, and bowls for  smaller portions and to prevent overeating.  Use your calories on foods and drinks that will fill you up and not leave you hungry shortly after a meal. This information is not intended to replace advice given to you by your health care provider. Make sure you discuss any questions you have with your health care provider. Document Revised: 02/10/2019 Document Reviewed: 02/10/2019 Elsevier Patient Education  2021 [employer].

## 2020-03-20 NOTE — Progress Notes (Signed)
Virtual Visit via Telephone Note  I connected with SIA GABRIELSEN on 03/20/20 at  8:40 AM EST by telephone and verified that I am speaking with the correct person using two identifiers.  Location: Patient: home Provider: office   I discussed the limitations, risks, security and privacy concerns of performing an evaluation and management service by telephone and the availability of in person appointments. I also discussed with the patient that there may be a patient responsible charge related to this service. The patient expressed understanding and agreed to proceed.   History of Present Illness: Doing very well with phentermine and wants to continue same, has been exercising and watching her diet Denies recent fever or chills. C/o  sinus pressure, nasal congestion,watery itchy eyes, denies  ear pain or sore throat. Denies chest congestion, productive cough or wheezing. Denies chest pains, palpitations and leg swelling Denies abdominal pain, nausea, vomiting,diarrhea or constipation.   Denies dysuria, frequency, hesitancy or incontinence. Denies joint pain, swelling and limitation in mobility. Denies headaches, seizures, numbness, or tingling. Denies depression, anxiety or insomnia. Denies skin break down or rash.       Observations/Objective:  BP 124/88   Pulse 97   Ht 5\' 6"  (1.676 m)   Wt 197 lb (89.4 kg)   LMP 10/11/2015 (Approximate)   BMI 31.80 kg/m  Good communication with no confusion and intact memory. Alert and oriented x 3 No signs of respiratory distress during speech   Assessment and Plan: Allergic rhinitis Uncontrolled currently add daily astelin  Depression, major, recurrent (HCC) Controlled, no change in medication   Essential hypertension Controlled, no change in medication DASH diet and commitment to daily physical activity for a minimum of 30 minutes discussed and encouraged, as a part of hypertension management. The importance of attaining a  healthy weight is also discussed.  BP/Weight 03/20/2020 01/18/2020 11/07/2019 09/02/2019 08/15/2019 08/12/2019 9/98/3382  Systolic BP 505 397 673 [phone] 097  Diastolic BP 88 83 81 84 80 80 71  Wt. (Lbs) 197 205 203.08 200 201.12 - 199  BMI 31.8 33.09 32.78 32.28 32.46 - 32.12       Obesity (BMI 30.0-34.9) Improved  Patient re-educated about  the importance of commitment to a  minimum of 150 minutes of exercise per week as able.  The importance of healthy food choices with portion control discussed, as well as eating regularly and within a 12 hour window most days. The need to choose "clean , green" food 50 to 75% of the time is discussed, as well as to make water the primary drink and set a goal of 64 ounces water daily.    Weight /BMI 03/20/2020 01/18/2020 11/07/2019  WEIGHT 197 lb 205 lb 203 lb 1.3 oz  HEIGHT 5\' 6"  5\' 6"  5\' 6"   BMI 31.8 kg/m2 33.09 kg/m2 32.78 kg/m2        Follow Up Instructions:    I discussed the assessment and treatment plan with the patient. The patient was provided an opportunity to ask questions and all were answered. The patient agreed with the plan and demonstrated an understanding of the instructions.   The patient was advised to call back or seek an in-person evaluation if the symptoms worsen or if the condition fails to improve as anticipated.  I provided 14 minutes of non-face-to-face time during this encounter.   Tula Nakayama, MD

## 2020-03-24 ENCOUNTER — Encounter: Payer: Self-pay | Admitting: Family Medicine

## 2020-03-24 NOTE — Assessment & Plan Note (Signed)
Controlled, no change in medication  

## 2020-03-24 NOTE — Assessment & Plan Note (Signed)
Uncontrolled currently add daily astelin

## 2020-03-24 NOTE — Assessment & Plan Note (Signed)
Improved  Patient re-educated about  the importance of commitment to a  minimum of 150 minutes of exercise per week as able.  The importance of healthy food choices with portion control discussed, as well as eating regularly and within a 12 hour window most days. The need to choose "clean , green" food 50 to 75% of the time is discussed, as well as to make water the primary drink and set a goal of 64 ounces water daily.    Weight /BMI 03/20/2020 01/18/2020 11/07/2019  WEIGHT 197 lb 205 lb 203 lb 1.3 oz  HEIGHT 5\' 6"  5\' 6"  5\' 6"   BMI 31.8 kg/m2 33.09 kg/m2 32.78 kg/m2

## 2020-03-24 NOTE — Assessment & Plan Note (Signed)
Controlled, no change in medication DASH diet and commitment to daily physical activity for a minimum of 30 minutes discussed and encouraged, as a part of hypertension management. The importance of attaining a healthy weight is also discussed.  BP/Weight 03/20/2020 01/18/2020 11/07/2019 09/02/2019 08/15/2019 08/12/2019 09/29/7559  Systolic BP 254 832 346 887 373 081 683  Diastolic BP 88 83 81 84 80 80 71  Wt. (Lbs) 197 205 203.08 200 201.12 - 199  BMI 31.8 33.09 32.78 32.28 32.46 - 32.12

## 2020-04-11 ENCOUNTER — Ambulatory Visit: Payer: Self-pay | Admitting: Family Medicine

## 2020-06-16 ENCOUNTER — Other Ambulatory Visit: Payer: Self-pay | Admitting: Family Medicine

## 2020-06-20 ENCOUNTER — Ambulatory Visit: Payer: Managed Care, Other (non HMO) | Admitting: Family Medicine

## 2020-07-09 ENCOUNTER — Encounter: Payer: Self-pay | Admitting: Family Medicine

## 2020-07-09 ENCOUNTER — Ambulatory Visit (INDEPENDENT_AMBULATORY_CARE_PROVIDER_SITE_OTHER): Payer: 59 | Admitting: Family Medicine

## 2020-07-09 ENCOUNTER — Other Ambulatory Visit: Payer: Self-pay

## 2020-07-09 VITALS — BP 120/79 | HR 88 | Temp 98.5°F | Resp 20 | Ht 65.0 in | Wt 196.0 lb

## 2020-07-09 DIAGNOSIS — I10 Essential (primary) hypertension: Secondary | ICD-10-CM

## 2020-07-09 DIAGNOSIS — E66811 Obesity, class 1: Secondary | ICD-10-CM

## 2020-07-09 DIAGNOSIS — F5104 Psychophysiologic insomnia: Secondary | ICD-10-CM | POA: Diagnosis not present

## 2020-07-09 DIAGNOSIS — E669 Obesity, unspecified: Secondary | ICD-10-CM

## 2020-07-09 DIAGNOSIS — E785 Hyperlipidemia, unspecified: Secondary | ICD-10-CM | POA: Diagnosis not present

## 2020-07-09 DIAGNOSIS — E559 Vitamin D deficiency, unspecified: Secondary | ICD-10-CM

## 2020-07-09 DIAGNOSIS — J3089 Other allergic rhinitis: Secondary | ICD-10-CM

## 2020-07-09 MED ORDER — TEMAZEPAM 30 MG PO CAPS: 30.0000 mg | ORAL_CAPSULE | Freq: Every evening | ORAL | 5 refills | Status: DC | PRN

## 2020-07-09 MED ORDER — PHENTERMINE HCL 37.5 MG PO TABS: 37.5000 mg | ORAL_TABLET | Freq: Every day | ORAL | 3 refills | Status: DC

## 2020-07-09 NOTE — Patient Instructions (Addendum)
F/u in 4  months, call if you need me sooner..  Weight loss goal of 10 pounds  2nd covid booster due 8/22/or after  Mammogram this week  Eat on a regular schedule  Labs today already ordered , pls add vit D  .It is important that you exercise regularly at least 30 minutes 5 times a week. If you develop chest pain, have severe difficulty breathing, or feel very tired, stop exercising immediately and seek medical attention  Think about what you will eat, plan ahead. Choose " clean, green, fresh or frozen" over canned, processed or packaged foods which are more sugary, salty and fatty. 70 to 75% of food eaten should be vegetables and fruit. Three meals at set times with snacks allowed between meals, but they must be fruit or vegetables. Aim to eat over a 12 hour period , example 7 am to 7 pm, and STOP after  your last meal of the day. Drink water,generally about 64 ounces per day, no other drink is as healthy. Fruit juice is best enjoyed in a healthy way, by EATING the fruit. Thanks for choosing [facility], we consider it a privelige to serve you.

## 2020-07-09 NOTE — Progress Notes (Signed)
ALIZZON DIOGUARDI     MRN: [id number]      DOB: 1970-09-11   HPI Ms. Vandehei is here for follow up and re-evaluation of chronic medical conditions, medication management and review of any available recent lab and radiology data.  Preventive health is updated, specifically  Cancer screening and Immunization.   Questions or concerns regarding consultations or procedures which the PT has had in the interim are  addressed. The PT denies any adverse reactions to current medications since the last visit.  There are no new concerns.  There are no specific complaints   ROS Denies recent fever or chills. Denies sinus pressure, nasal congestion, ear pain or sore throat. Denies chest congestion, productive cough or wheezing. Denies chest pains, palpitations and leg swelling Denies abdominal pain, nausea, vomiting,diarrhea or constipation.   Denies dysuria, frequency, hesitancy or incontinence. Denies joint pain, swelling and limitation in mobility. Denies headaches, seizures, numbness, or tingling. Denies depression, anxiety or insomnia. Denies skin break down or rash.   PE  BP 120/79 (BP Location: Right Arm, Patient Position: Sitting, Cuff Size: Large)   Pulse 88   Temp 98.5 F (36.9 C)   Resp 20   Ht 5\' 5"  (1.651 m)   Wt 196 lb (88.9 kg)   LMP 10/11/2015 (Approximate)   SpO2 96%   BMI 32.62 kg/m   Patient alert and oriented and in no cardiopulmonary distress.  HEENT: No facial asymmetry, EOMI,     Neck supple .  Chest: Clear to auscultation bilaterally.  CVS: S1, S2 no murmurs, no S3.Regular rate.  ABD: Soft non tender.   Ext: No edema  MS: Adequate ROM spine, shoulders, hips and knees.  Skin: Intact, no ulcerations or rash noted.  Psych: Good eye contact, normal affect. Memory intact not anxious or depressed appearing.  CNS: CN 2-12 intact, power,  normal throughout.no focal deficits noted.   Assessment & Plan  Essential hypertension Controlled, no change in  medication DASH diet and commitment to daily physical activity for a minimum of 30 minutes discussed and encouraged, as a part of hypertension management. The importance of attaining a healthy weight is also discussed.  BP/Weight 07/09/2020 03/20/2020 01/18/2020 11/07/2019 09/02/2019 08/15/2019 7/90/2409  Systolic BP 735 329 924 268 341 962 229  Diastolic BP 79 88 83 81 84 80 80  Wt. (Lbs) 196 197 205 203.08 200 201.12 -  BMI 32.62 31.8 33.09 32.78 32.28 32.46 -       Obesity (BMI 30.0-34.9)  Patient re-educated about  the importance of commitment to a  minimum of 150 minutes of exercise per week as able.  The importance of healthy food choices with portion control discussed, as well as eating regularly and within a 12 hour window most days. The need to choose "clean , green" food 50 to 75% of the time is discussed, as well as to make water the primary drink and set a goal of 64 ounces water daily.    Weight /BMI 07/09/2020 03/20/2020 01/18/2020  WEIGHT 196 lb 197 lb 205 lb  HEIGHT 5\' 5"  5\' 6"  5\' 6"   BMI 32.62 kg/m2 31.8 kg/m2 33.09 kg/m2      Insomnia Sleep hygiene reviewed and written information offered also. Prescription sent for  medication needed.   Allergic rhinitis Controlled, no change in medication

## 2020-07-11 ENCOUNTER — Other Ambulatory Visit: Payer: Self-pay

## 2020-07-11 ENCOUNTER — Ambulatory Visit (HOSPITAL_COMMUNITY)
Admission: RE | Admit: 2020-07-11 | Discharge: 2020-07-11 | Disposition: A | Discharge status: Home / Self Care | Payer: 59 | Source: Ambulatory Visit | Attending: Family Medicine | Admitting: Family Medicine

## 2020-07-11 DIAGNOSIS — Z1231 Encounter for screening mammogram for malignant neoplasm of breast: Secondary | ICD-10-CM | POA: Insufficient documentation

## 2020-07-11 LAB — LIPID PANEL
Chol/HDL Ratio: 5.2 ratio — ABNORMAL HIGH (ref 0.0–4.4)
Cholesterol, Total: 227 mg/dL — ABNORMAL HIGH (ref 100–199)
HDL: 44 mg/dL (ref >39)
LDL Chol Calc (NIH): 171 mg/dL — ABNORMAL HIGH (ref 0–99)
Triglycerides: 70 mg/dL (ref 0–149)
VLDL Cholesterol Cal: 12 mg/dL (ref 5–40)

## 2020-07-11 LAB — CMP14+EGFR
ALT: 17 IU/L (ref 0–32)
AST: 35 IU/L (ref 0–40)
Albumin/Globulin Ratio: 1.8 (ref 1.2–2.2)
Albumin: 4.7 g/dL (ref 3.8–4.9)
Alkaline Phosphatase: 79 IU/L (ref 44–121)
BUN/Creatinine Ratio: 10 (ref 9–23)
BUN: 10 mg/dL (ref 6–24)
Bilirubin Total: <0.2 mg/dL (ref 0.0–1.2)
CO2: 22 mmol/L (ref 20–29)
Calcium: 10.6 mg/dL — ABNORMAL HIGH (ref 8.7–10.2)
Chloride: 105 mmol/L (ref 96–106)
Creatinine, Ser: 1.03 mg/dL — ABNORMAL HIGH (ref 0.57–1.00)
Globulin, Total: 2.6 g/dL (ref 1.5–4.5)
Glucose: 87 mg/dL (ref 65–99)
Potassium: 4.1 mmol/L (ref 3.5–5.2)
Sodium: 142 mmol/L (ref 134–144)
Total Protein: 7.3 g/dL (ref 6.0–8.5)
eGFR: 66 mL/min/{1.73_m2} (ref >59)

## 2020-07-11 LAB — VITAMIN D 25 HYDROXY (VIT D DEFICIENCY, FRACTURES): Vit D, 25-Hydroxy: 22.9 ng/mL — ABNORMAL LOW (ref 30.0–100.0)

## 2020-07-16 ENCOUNTER — Encounter: Payer: Self-pay | Admitting: Family Medicine

## 2020-07-16 NOTE — Assessment & Plan Note (Signed)
Controlled, no change in medication DASH diet and commitment to daily physical activity for a minimum of 30 minutes discussed and encouraged, as a part of hypertension management. The importance of attaining a healthy weight is also discussed.  BP/Weight 07/09/2020 03/20/2020 01/18/2020 11/07/2019 09/02/2019 08/15/2019 2/44/6950  Systolic BP 722 575 051 833 582 518 984  Diastolic BP 79 88 83 81 84 80 80  Wt. (Lbs) 196 197 205 203.08 200 201.12 -  BMI 32.62 31.8 33.09 32.78 32.28 32.46 -

## 2020-07-16 NOTE — Assessment & Plan Note (Signed)
Controlled, no change in medication  

## 2020-07-16 NOTE — Assessment & Plan Note (Signed)
Sleep hygiene reviewed and written information offered also. Prescription sent for  medication needed.  

## 2020-07-16 NOTE — Assessment & Plan Note (Signed)
  Patient re-educated about  the importance of commitment to a  minimum of 150 minutes of exercise per week as able.  The importance of healthy food choices with portion control discussed, as well as eating regularly and within a 12 hour window most days. The need to choose "clean , green" food 50 to 75% of the time is discussed, as well as to make water the primary drink and set a goal of 64 ounces water daily.    Weight /BMI 07/09/2020 03/20/2020 01/18/2020  WEIGHT 196 lb 197 lb 205 lb  HEIGHT 5\' 5"  5\' 6"  5\' 6"   BMI 32.62 kg/m2 31.8 kg/m2 33.09 kg/m2

## 2020-08-12 ENCOUNTER — Encounter: Payer: Self-pay | Admitting: Family Medicine

## 2020-08-13 ENCOUNTER — Other Ambulatory Visit: Payer: Self-pay

## 2020-08-13 ENCOUNTER — Telehealth: Payer: Self-pay

## 2020-08-13 ENCOUNTER — Encounter: Payer: Self-pay | Admitting: Internal Medicine

## 2020-08-13 ENCOUNTER — Telehealth (INDEPENDENT_AMBULATORY_CARE_PROVIDER_SITE_OTHER): Payer: 59 | Admitting: Internal Medicine

## 2020-08-13 DIAGNOSIS — U071 COVID-19: Secondary | ICD-10-CM

## 2020-08-13 MED ORDER — NIRMATRELVIR/RITONAVIR (PAXLOVID)TABLET: 3.0000 | ORAL_TABLET | Freq: Two times a day (BID) | ORAL | 0 refills | Status: AC

## 2020-08-13 MED ORDER — NIRMATRELVIR/RITONAVIR (PAXLOVID)TABLET: 3.0000 | ORAL_TABLET | Freq: Two times a day (BID) | ORAL | 0 refills | Status: DC

## 2020-08-13 NOTE — Progress Notes (Signed)
Virtual Visit via Telephone Note   This visit type was conducted due to national recommendations for restrictions regarding the COVID-19 Pandemic (e.g. social distancing) in an effort to limit this patient's exposure and mitigate transmission in our community.  Due to her co-morbid illnesses, this patient is at least at moderate risk for complications without adequate follow up.  This format is felt to be most appropriate for this patient at this time.  The patient did not have access to video technology/had technical difficulties with video requiring transitioning to audio format only (telephone).  All issues noted in this document were discussed and addressed.  No physical exam could be performed with this format.    Evaluation Performed:  Follow-up visit  Date:  08/13/2020   ID:  Chelsey Lewis, Chelsey Lewis 01-Jul-1970, MRN [id number]  Patient Location: Home Provider Location: Office/Clinic  Participants: Patient Location of Patient: Home Location of Provider: Telehealth Consent was obtain for visit to be over via telehealth. I verified that I am speaking with the correct person using two identifiers.  PCP:  Fayrene Helper, MD   Chief Complaint:    History of Present Illness:    IVAH Lewis is a 50 y.o. female who has a telelvisit for c/o dry cough, fever, myalgias and sore throat for last 2 days. She tested positive for COVID yesterday. She denies any dyspnea, chest pain or palpitations. She has not tried any OTC medications for symptomatic relief yet. She is vaccinated for COVID.  The patient does have symptoms concerning for COVID-19 infection (fever, chills, cough, or new shortness of breath).   Past Medical, Surgical, Social History, Allergies, and Medications have been Reviewed.  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/2011   accident on the job, distal phalanx right md finger   COLONOSCOPY WITH PROPOFOL N/A 11/22/2018   Procedure: COLONOSCOPY WITH PROPOFOL;  Surgeon: Daneil Dolin, MD;  Location: [facility];  Service: Endoscopy;  Laterality: N/A;  7:30am   ECTOPIC PREGNANCY SURGERY     oopherectomy and tubal ligation     POLYPECTOMY  11/22/2018   Procedure: POLYPECTOMY;  Surgeon: Daneil Dolin, MD;  Location: [facility];  Service: Endoscopy;;   TUBAL LIGATION       Current Meds  Medication Sig   amLODipine (NORVASC) 10 MG tablet TAKE 1 TABLET BY MOUTH EVERY DAY   azelastine (ASTELIN) 0.1 % nasal spray Place 2 sprays into both nostrils 2 (two) times daily. Use in each nostril as directed   nirmatrelvir/ritonavir EUA (PAXLOVID) TABS Take 3 tablets by mouth 2 (two) times daily for 5 days. (Take nirmatrelvir 150 mg two tablets twice daily for 5 days and ritonavir 100 mg one tablet twice daily for 5 days) Patient GFR is 88.   phentermine (ADIPEX-P) 37.5 MG tablet Take 1 tablet (37.5 mg total) by mouth daily before breakfast.   rosuvastatin (CRESTOR) 5 MG tablet TAKE 1 TABLET BY MOUTH EVERY DAY  temazepam (RESTORIL) 30 MG capsule Take 1 capsule (30 mg total) by mouth at bedtime as needed for sleep.     Allergies:   Patient has no known allergies.   ROS:   Please see the history of present illness.     All other systems reviewed and are negative.   Labs/Other Tests and Data Reviewed:    Recent Labs: 11/22/2019: Hemoglobin 14.4; Platelets 412; TSH 1.55 07/09/2020: ALT 17; BUN 10; Creatinine, Ser 1.03; Potassium 4.1; Sodium 142   Recent Lipid Panel Lab Results  Component  Value Date/Time   CHOL 227 (H) 07/09/2020 10:50 AM   TRIG 70 07/09/2020 10:50 AM   HDL 44 07/09/2020 10:50 AM   CHOLHDL 5.2 (H) 07/09/2020 10:50 AM   CHOLHDL 5.2 (H) 11/22/2019 09:06 AM   LDLCALC 171 (H) 07/09/2020 10:50 AM   LDLCALC 163 (H) 11/22/2019 09:06 AM    Wt Readings from Last 3 Encounters:  07/09/20 196 lb (88.9 kg)  03/20/20 197 lb (89.4 kg)  01/18/20 205 lb (93 kg)      ASSESSMENT & PLAN:    COVID-19 infection Started Paxlovid Mucinex or Robitussin for cough Self-quarantine for now for at least 7 days or until 24-hour afebrile period, whichever is later  Time:   Today, I have spent 8 minutes reviewing the chart, including problem list, medications, and with the patient with telehealth technology discussing the above problems.   Medication Adjustments/Labs and Tests Ordered: Current medicines are reviewed at length with the patient today.  Concerns regarding medicines are outlined above.   Tests Ordered: No orders of the defined types were placed in this encounter.   Medication Changes: Meds ordered this encounter  Medications   nirmatrelvir/ritonavir EUA (PAXLOVID) TABS    Sig: Take 3 tablets by mouth 2 (two) times daily for 5 days. (Take nirmatrelvir 150 mg two tablets twice daily for 5 days and ritonavir 100 mg one tablet twice daily for 5 days) Patient GFR is 88.    Dispense:  30 tablet    Refill:  0     Note: This dictation was prepared with Dragon dictation along with smaller phrase technology. Similar sounding words can be transcribed inadequately or may not be corrected upon review. Any transcriptional errors that result from this process are unintentional.      Disposition:  Follow up  Signed, Lindell Spar, MD  08/13/2020 1:12 PM     [facility]

## 2020-08-13 NOTE — Telephone Encounter (Signed)
Pt made an appt today 08-13-20 with Chelsey Lewis

## 2020-08-13 NOTE — Addendum Note (Signed)
Addended byIhor Dow on: 08/13/2020 05:21 PM   Modules accepted: Orders

## 2020-08-13 NOTE — Telephone Encounter (Signed)
Chelsey Lewis from [place] called they are completely out of this medicine nirmatrelvir/ritonavir EUA (PAXLOVID) TABS   Call back # [phone]

## 2020-11-08 ENCOUNTER — Other Ambulatory Visit: Payer: Self-pay

## 2020-11-08 ENCOUNTER — Ambulatory Visit (INDEPENDENT_AMBULATORY_CARE_PROVIDER_SITE_OTHER): Payer: Self-pay | Admitting: Family Medicine

## 2020-11-08 ENCOUNTER — Encounter: Payer: Self-pay | Admitting: Family Medicine

## 2020-11-08 VITALS — BP 128/84 | HR 92 | Resp 17 | Ht 65.0 in | Wt 189.1 lb

## 2020-11-08 DIAGNOSIS — E669 Obesity, unspecified: Secondary | ICD-10-CM

## 2020-11-08 DIAGNOSIS — I10 Essential (primary) hypertension: Secondary | ICD-10-CM

## 2020-11-08 DIAGNOSIS — Z23 Encounter for immunization: Secondary | ICD-10-CM

## 2020-11-08 DIAGNOSIS — E785 Hyperlipidemia, unspecified: Secondary | ICD-10-CM

## 2020-11-08 MED ORDER — PHENTERMINE HCL 37.5 MG PO TABS: 37.5000 mg | ORAL_TABLET | Freq: Every day | ORAL | 3 refills | Status: DC

## 2020-11-08 NOTE — Assessment & Plan Note (Signed)
Hyperlipidemia:Low fat diet discussed and encouraged.   Lipid Panel  Lab Results  Component Value Date   CHOL 227 (H) 07/09/2020   HDL 44 07/09/2020   LDLCALC 171 (H) 07/09/2020   TRIG 70 07/09/2020   CHOLHDL 5.2 (H) 07/09/2020     Updated lab needed, not at goal

## 2020-11-08 NOTE — Progress Notes (Signed)
   Chelsey Lewis     MRN: [id number]      DOB: 26-Nov-1970   HPI Chelsey Lewis is here for follow up and re-evaluation of chronic medical conditions, medication management and review of any available recent lab and radiology data.  Preventive health is updated, specifically  Cancer screening and Immunization.   . The PT denies any adverse reactions to current medications since the last visit.  There are no new concerns.  There are no specific complaints   ROS Denies recent fever or chills. Denies sinus pressure, nasal congestion, ear pain or sore throat. Denies chest congestion, productive cough or wheezing. Denies chest pains, palpitations and leg swelling Denies abdominal pain, nausea, vomiting,diarrhea or constipation.   Denies dysuria, frequency, hesitancy or incontinence. Denies joint pain, swelling and limitation in mobility. Denies headaches, seizures, numbness, or tingling. Denies depression, anxiety or insomnia. Denies skin break down or rash.   PE  BP 128/84   Pulse 92   Resp 17   Ht 5\' 5"  (1.651 m)   Wt 189 lb 1.9 oz (85.8 kg)   LMP 10/11/2015 (Approximate)   SpO2 98%   BMI 31.47 kg/m   Patient alert and oriented and in no cardiopulmonary distress.  HEENT: No facial asymmetry, EOMI,     Neck supple .  Chest: Clear to auscultation bilaterally.  CVS: S1, S2 no murmurs, no S3.Regular rate.  ABD: Soft non tender.   Ext: No edema  MS: Adequate ROM spine, shoulders, hips and knees.  Skin: Intact, no ulcerations or rash noted.  Psych: Good eye contact, normal affect. Memory intact not anxious or depressed appearing.  CNS: CN 2-12 intact, power,  normal throughout.no focal deficits noted.   Assessment & Plan Essential hypertension Controlled, no change in medication DASH diet and commitment to daily physical activity for a minimum of 30 minutes discussed and encouraged, as a part of hypertension management. The importance of attaining a healthy weight is also  discussed.  BP/Weight 11/08/2020 07/09/2020 03/20/2020 01/18/2020 11/07/2019 8/93/7342 08/19/6809  Systolic BP 572 620 355 974 163 845 364  Diastolic BP 84 79 88 83 81 84 80  Wt. (Lbs) 189.12 196 197 205 203.08 200 201.12  BMI 31.47 32.62 31.8 33.09 32.78 32.28 32.46       Obesity (BMI 30.0-34.9)  Patient re-educated about  the importance of commitment to a  minimum of 150 minutes of exercise per week as able.  The importance of healthy food choices with portion control discussed, as well as eating regularly and within a 12 hour window most days. The need to choose "clean , green" food 50 to 75% of the time is discussed, as well as to make water the primary drink and set a goal of 64 ounces water daily.    Weight /BMI 11/08/2020 07/09/2020 03/20/2020  WEIGHT 189 lb 1.9 oz 196 lb 197 lb  HEIGHT 5\' 5"  5\' 5"  5\' 6"   BMI 31.47 kg/m2 32.62 kg/m2 31.8 kg/m2    Improved continue pentermine daily, target 2 to 2.5 pound weight loss per month  Dyslipidemia, goal LDL below 100 Hyperlipidemia:Low fat diet discussed and encouraged.   Lipid Panel  Lab Results  Component Value Date   CHOL 227 (H) 07/09/2020   HDL 44 07/09/2020   LDLCALC 171 (H) 07/09/2020   TRIG 70 07/09/2020   CHOLHDL 5.2 (H) 07/09/2020     Updated lab needed, not at goal

## 2020-11-08 NOTE — Patient Instructions (Addendum)
Annual with pap in 4 months, call if you need me before  No med change   Please get covid vaccine in November  Congrats on weight loss  Fasting lipid, cmp and eGFr, CBC 5 days before visit  It is important that you exercise regularly at least 30 minutes 5 times a week. If you develop chest pain, have severe difficulty breathing, or feel very tired, stop exercising immediately and seek medical attention  Think about what you will eat, plan ahead. Choose " clean, green, fresh or frozen" over canned, processed or packaged foods which are more sugary, salty and fatty. 70 to 75% of food eaten should be vegetables and fruit. Three meals at set times with snacks allowed between meals, but they must be fruit or vegetables. Aim to eat over a 12 hour period , example 7 am to 7 pm, and STOP after  your last meal of the day. Drink water,generally about 64 ounces per day, no other drink is as healthy. Fruit juice is best enjoyed in a healthy way, by EATING the fruit. Max 1200 to 1500 cal/ day, wach you soda

## 2020-11-08 NOTE — Assessment & Plan Note (Signed)
  Patient re-educated about  the importance of commitment to a  minimum of 150 minutes of exercise per week as able.  The importance of healthy food choices with portion control discussed, as well as eating regularly and within a 12 hour window most days. The need to choose "clean , green" food 50 to 75% of the time is discussed, as well as to make water the primary drink and set a goal of 64 ounces water daily.    Weight /BMI 11/08/2020 07/09/2020 03/20/2020  WEIGHT 189 lb 1.9 oz 196 lb 197 lb  HEIGHT 5\' 5"  5\' 5"  5\' 6"   BMI 31.47 kg/m2 32.62 kg/m2 31.8 kg/m2    Improved continue pentermine daily, target 2 to 2.5 pound weight loss per month

## 2020-11-08 NOTE — Assessment & Plan Note (Signed)
Controlled, no change in medication DASH diet and commitment to daily physical activity for a minimum of 30 minutes discussed and encouraged, as a part of hypertension management. The importance of attaining a healthy weight is also discussed.  BP/Weight 11/08/2020 07/09/2020 03/20/2020 01/18/2020 11/07/2019 8/86/4847 2/0/7218  Systolic BP 288 337 445 146 047 998 721  Diastolic BP 84 79 88 83 81 84 80  Wt. (Lbs) 189.12 196 197 205 203.08 200 201.12  BMI 31.47 32.62 31.8 33.09 32.78 32.28 32.46

## 2020-12-30 ENCOUNTER — Other Ambulatory Visit: Payer: Self-pay | Admitting: Family Medicine

## 2021-01-09 ENCOUNTER — Other Ambulatory Visit: Payer: Self-pay | Admitting: Family Medicine

## 2021-02-07 ENCOUNTER — Other Ambulatory Visit: Payer: Self-pay | Admitting: Internal Medicine

## 2021-02-13 ENCOUNTER — Other Ambulatory Visit: Payer: Self-pay | Admitting: Family Medicine

## 2021-02-15 ENCOUNTER — Other Ambulatory Visit: Payer: Self-pay | Admitting: Family Medicine

## 2021-02-26 ENCOUNTER — Other Ambulatory Visit: Payer: Self-pay

## 2021-02-26 MED ORDER — TEMAZEPAM 30 MG PO CAPS: ORAL_CAPSULE | ORAL | 0 refills | Status: DC

## 2021-02-26 NOTE — Telephone Encounter (Signed)
Rx printed for simpson to sign

## 2021-02-27 ENCOUNTER — Encounter: Payer: 59 | Admitting: Family Medicine

## 2021-03-06 ENCOUNTER — Encounter: Payer: Self-pay | Admitting: Family Medicine

## 2021-03-29 ENCOUNTER — Other Ambulatory Visit: Payer: Self-pay | Admitting: Family Medicine

## 2021-04-26 ENCOUNTER — Other Ambulatory Visit (HOSPITAL_COMMUNITY)
Admission: RE | Admit: 2021-04-26 | Discharge: 2021-04-26 | Disposition: A | Discharge status: Home / Self Care | Payer: 59 | Source: Ambulatory Visit | Attending: Family Medicine | Admitting: Family Medicine

## 2021-04-26 ENCOUNTER — Encounter: Payer: Self-pay | Admitting: Family Medicine

## 2021-04-26 ENCOUNTER — Ambulatory Visit (INDEPENDENT_AMBULATORY_CARE_PROVIDER_SITE_OTHER): Payer: Self-pay | Admitting: Family Medicine

## 2021-04-26 VITALS — BP 120/84 | HR 97 | Resp 16 | Ht 65.0 in | Wt 183.0 lb

## 2021-04-26 DIAGNOSIS — Z0000 Encounter for general adult medical examination without abnormal findings: Secondary | ICD-10-CM | POA: Insufficient documentation

## 2021-04-26 DIAGNOSIS — Z124 Encounter for screening for malignant neoplasm of cervix: Secondary | ICD-10-CM | POA: Diagnosis present

## 2021-04-26 DIAGNOSIS — E559 Vitamin D deficiency, unspecified: Secondary | ICD-10-CM

## 2021-04-26 DIAGNOSIS — E785 Hyperlipidemia, unspecified: Secondary | ICD-10-CM

## 2021-04-26 DIAGNOSIS — I10 Essential (primary) hypertension: Secondary | ICD-10-CM

## 2021-04-26 DIAGNOSIS — Z1231 Encounter for screening mammogram for malignant neoplasm of breast: Secondary | ICD-10-CM

## 2021-04-26 MED ORDER — SCOPOLAMINE 1 MG/3DAYS TD PT72: 1.0000 | MEDICATED_PATCH | TRANSDERMAL | 0 refills | Status: DC

## 2021-04-26 NOTE — Patient Instructions (Signed)
F/U end September, call if you need me sooner ? ?Please schedule mammogram at checkout ? ? ?Pap sent ? ?Congrats on weight loss , keep it up ? ?Patches are prescribed for motion sickness, safe trip and have fun! ? ?Please get fasting labs asap, CBC, lipid, cmp and EGFr, TSH and vit D ? ?Thanks for choosing [facility], we consider it a privelige to serve you. ? ? ? ?

## 2021-04-26 NOTE — Assessment & Plan Note (Signed)

## 2021-04-28 ENCOUNTER — Encounter: Payer: Self-pay | Admitting: Family Medicine

## 2021-04-28 NOTE — Progress Notes (Signed)
? ? ?  Chelsey Lewis     MRN: [id number]      DOB: 1970/03/15 ? ?HPI: ?Patient is in for annual physical exam. ?No other health concerns are expressed or addressed at the visit. ?Recent labs,  are reviewed. ?Immunization is reviewed , and  updated if needed. ?Requests patches fopr motion sickness as traveling in the near future ? ?PE: ?BP 120/84   Pulse 97   Resp 16   Ht '5\' 5"'$  (1.651 m)   Wt 183 lb (83 kg)   LMP 10/11/2015 (Approximate)   SpO2 94%   BMI 30.45 kg/m?  ? ?Pleasant  female, alert and oriented x 3, in no cardio-pulmonary distress. ?Afebrile. ?HEENT ?No facial trauma or asymetry. Sinuses non tender.  ?Extra occullar muscles intact.Marland Kitchen ?External ears normal, . ?Neck: supple, no adenopathy,JVD or thyromegaly.No bruits. ? ?Chest: ?Clear to ascultation bilaterally.No crackles or wheezes. ?Non tender to palpation ? ?Breast: ?No asymetry,no masses or lumps. No tenderness. No nipple discharge or inversion. ?No axillary or supraclavicular adenopathy ? ?Cardiovascular system; ?Heart sounds normal,  S1 and  S2 ,no S3.  No murmur, or thrill. ?Apical beat not displaced ?Peripheral pulses normal. ? ?Abdomen: ?Soft, non tender, no organomegaly or masses. ?No bruits. ?Bowel sounds normal. ?No guarding, tenderness or rebound. ? ? ?GU: ?External genitalia normal female genitalia , normal female distribution of hair. No lesions. ?Urethral meatus normal in size, no  Prolapse, no lesions visibly  Present. ?Bladder non tender. ?Vagina pink and moist , with no visible lesions , discharge present . ?Adequate pelvic support no  cystocele or rectocele noted ?Cervix pink and appears healthy, no lesions or ulcerations noted, no discharge noted from os ?Uterus normal size, no adnexal masses, no cervical motion or adnexal tenderness. ? ? ?Musculoskeletal exam: ?Full ROM of spine, hips , shoulders and knees. ?No deformity ,swelling or crepitus noted. ?No muscle wasting or atrophy.  ? ?Neurologic: ?Cranial nerves 2 to 12  intact. ?Power, tone ,sensation and reflexes normal throughout. ?No disturbance in gait. No tremor. ? ?Skin: ?Intact, no ulceration, erythema , scaling or rash noted. ?Pigmentation normal throughout ? ?Psych; ?Normal mood and affect. Judgement and concentration normal ? ? ?Assessment & Plan:  ?Annual physical exam ?Annual exam as documented. ?Counseling done  re healthy lifestyle involving commitment to 150 minutes exercise per week, heart healthy diet, and attaining healthy weight.The importance of adequate sleep also discussed. ?Regular seat belt use and home safety, is also discussed. ?Changes in health habits are decided on by the patient with goals and time frames  set for achieving them. ?Immunization and cancer screening needs are specifically addressed at this visit. ? ?

## 2021-04-30 LAB — CYTOLOGY - PAP
Comment: NEGATIVE
Diagnosis: NEGATIVE
High risk HPV: NEGATIVE

## 2021-05-06 ENCOUNTER — Other Ambulatory Visit: Payer: Self-pay | Admitting: Family Medicine

## 2021-05-12 ENCOUNTER — Encounter: Payer: Self-pay | Admitting: Family Medicine

## 2021-05-14 ENCOUNTER — Other Ambulatory Visit: Payer: Self-pay | Admitting: Family Medicine

## 2021-05-14 MED ORDER — PHENTERMINE HCL 37.5 MG PO TABS: 37.5000 mg | ORAL_TABLET | Freq: Every day | ORAL | 4 refills | Status: DC

## 2021-06-08 ENCOUNTER — Other Ambulatory Visit: Payer: Self-pay | Admitting: Family Medicine

## 2021-06-20 ENCOUNTER — Telehealth: Payer: Self-pay

## 2021-06-20 ENCOUNTER — Other Ambulatory Visit: Payer: Self-pay | Admitting: Family Medicine

## 2021-06-20 MED ORDER — TEMAZEPAM 30 MG PO CAPS: 30.0000 mg | ORAL_CAPSULE | Freq: Every day | ORAL | 5 refills | Status: DC

## 2021-06-20 NOTE — Telephone Encounter (Signed)
Patient called need med refill  temazepam (RESTORIL) 30 MG capsule   Pharmcy: [facility]

## 2021-08-05 ENCOUNTER — Ambulatory Visit (HOSPITAL_COMMUNITY)
Admission: RE | Admit: 2021-08-05 | Discharge: 2021-08-05 | Disposition: A | Discharge status: Home / Self Care | Payer: 59 | Source: Ambulatory Visit | Attending: Family Medicine | Admitting: Family Medicine

## 2021-08-05 DIAGNOSIS — Z1231 Encounter for screening mammogram for malignant neoplasm of breast: Secondary | ICD-10-CM | POA: Diagnosis not present

## 2021-09-24 ENCOUNTER — Other Ambulatory Visit: Payer: Self-pay | Admitting: Family Medicine

## 2021-10-08 ENCOUNTER — Ambulatory Visit: Payer: 59 | Admitting: Family Medicine

## 2021-10-09 ENCOUNTER — Ambulatory Visit: Payer: Self-pay | Admitting: Family Medicine

## 2021-10-25 ENCOUNTER — Encounter: Payer: Self-pay | Admitting: Family Medicine

## 2021-10-25 ENCOUNTER — Ambulatory Visit (INDEPENDENT_AMBULATORY_CARE_PROVIDER_SITE_OTHER): Payer: 59 | Admitting: Family Medicine

## 2021-10-25 VITALS — BP 125/79 | HR 92 | Ht 66.0 in | Wt 186.0 lb

## 2021-10-25 DIAGNOSIS — Z23 Encounter for immunization: Secondary | ICD-10-CM

## 2021-10-25 DIAGNOSIS — I10 Essential (primary) hypertension: Secondary | ICD-10-CM

## 2021-10-25 DIAGNOSIS — E669 Obesity, unspecified: Secondary | ICD-10-CM | POA: Diagnosis not present

## 2021-10-25 DIAGNOSIS — E559 Vitamin D deficiency, unspecified: Secondary | ICD-10-CM | POA: Diagnosis not present

## 2021-10-25 DIAGNOSIS — J3089 Other allergic rhinitis: Secondary | ICD-10-CM

## 2021-10-25 DIAGNOSIS — E785 Hyperlipidemia, unspecified: Secondary | ICD-10-CM | POA: Diagnosis not present

## 2021-10-25 DIAGNOSIS — F5104 Psychophysiologic insomnia: Secondary | ICD-10-CM

## 2021-10-25 DIAGNOSIS — R69 Illness, unspecified: Secondary | ICD-10-CM | POA: Diagnosis not present

## 2021-10-25 MED ORDER — ROSUVASTATIN CALCIUM 5 MG PO TABS
5.0000 mg | ORAL_TABLET | Freq: Every day | ORAL | 3 refills | Status: DC
Start: 2021-10-25 — End: 2021-10-30

## 2021-10-25 MED ORDER — AMLODIPINE BESYLATE 10 MG PO TABS: 10.0000 mg | ORAL_TABLET | Freq: Every day | ORAL | 3 refills | Status: DC

## 2021-10-25 NOTE — Patient Instructions (Addendum)
F/U in 4 months, call if you need me sooner  Need to lose 8 to 10 pounds in next 4 months to continue medication  Flu vaccine today  It is important that you exercise regularly at least 30 minutes 5 times a week. If you develop chest pain, have severe difficulty breathing, or feel very tired, stop exercising immediately and seek medical attention    Think about what you will eat, plan ahead. Choose " clean, green, fresh or frozen" over canned, processed or packaged foods which are more sugary, salty and fatty. 70 to 75% of food eaten should be vegetables and fruit. Three meals at set times with snacks allowed between meals, but they must be fruit or vegetables. Aim to eat over a 12 hour period , example 7 am to 7 pm, and STOP after  your last meal of the day. Drink water,generally about 64 ounces per day, no other drink is as healthy. Fruit juice is best enjoyed in a healthy way, by EATING the fruit.   Please re order fasting labs for draw next week Monday per pt request  Thanks for choosing [facility], we consider it a privelige to serve you.

## 2021-10-25 NOTE — Progress Notes (Signed)
foll

## 2021-10-26 ENCOUNTER — Encounter: Payer: Self-pay | Admitting: Family Medicine

## 2021-10-26 MED ORDER — PHENTERMINE HCL 37.5 MG PO TABS: 37.5000 mg | ORAL_TABLET | Freq: Every day | ORAL | 3 refills | Status: DC

## 2021-10-26 NOTE — Assessment & Plan Note (Signed)
Hyperlipidemia:Low fat diet discussed and encouraged.   Lipid Panel  Lab Results  Component Value Date   CHOL 227 (H) 07/09/2020   HDL 44 07/09/2020   LDLCALC 171 (H) 07/09/2020   TRIG 70 07/09/2020   CHOLHDL 5.2 (H) 07/09/2020   Updated lab needed at/ before next visit.

## 2021-10-26 NOTE — Assessment & Plan Note (Signed)
Unchanged  Patient re-educated about  the importance of commitment to a  minimum of 150 minutes of exercise per week as able.  The importance of healthy food choices with portion control discussed, as well as eating regularly and within a 12 hour window most days. The need to choose "clean , green" food 50 to 75% of the time is discussed, as well as to make water the primary drink and set a goal of 64 ounces water daily.       10/25/2021    4:12 PM 04/26/2021    1:28 PM 11/08/2020   10:03 AM  Weight /BMI  Weight 186 lb 183 lb 189 lb 1.9 oz  Height '5\' 6"'$  (1.676 m) '5\' 5"'$  (1.651 m) '5\' 5"'$  (1.651 m)  BMI 30.02 kg/m2 30.45 kg/m2 31.47 kg/m2

## 2021-10-26 NOTE — Progress Notes (Signed)
Chelsey Lewis     MRN: [id number]      DOB: [birth year]   HPI Chelsey Lewis is here for follow up and re-evaluation of chronic medical conditions, medication management and review of any available recent lab and radiology data.  Preventive health is updated, specifically  Cancer screening and Immunization.   Questions or concerns regarding consultations or procedures which the PT has had in the interim are  addressed. The PT denies any adverse reactions to current medications since the last visit.  There are no new concerns.  There are no specific complaints   ROS Denies recent fever or chills. Denies sinus pressure, nasal congestion, ear pain or sore throat. Denies chest congestion, productive cough or wheezing. Denies chest pains, palpitations and leg swelling Denies abdominal pain, nausea, vomiting,diarrhea or constipation.   Denies dysuria, frequency, hesitancy or incontinence. Denies joint pain, swelling and limitation in mobility. Denies headaches, seizures, numbness, or tingling. Denies depression, anxiety or insomnia. Denies skin break down or rash.   PE  BP 125/79 (BP Location: Right Arm, Patient Position: Sitting)   Pulse 92   Ht '5\' 6"'$  (1.676 m)   Wt 186 lb (84.4 kg)   LMP 10/11/2015 (Approximate)   SpO2 95%   BMI 30.02 kg/m   Patient alert and oriented and in no cardiopulmonary distress.  HEENT: No facial asymmetry, EOMI,     Neck supple .  Chest: Clear to auscultation bilaterally.  CVS: S1, S2 no murmurs, no S3.Regular rate.  ABD: Soft non tender.   Ext: No edema  MS: Adequate ROM spine, shoulders, hips and knees.  Skin: Intact, no ulcerations or rash noted.  Psych: Good eye contact, normal affect. Memory intact not anxious or depressed appearing.  CNS: CN 2-12 intact, power,  normal throughout.no focal deficits noted.   Assessment & Plan  Essential hypertension Controlled, no change in medication DASH diet and commitment to daily physical activity  for a minimum of 30 minutes discussed and encouraged, as a part of hypertension management. The importance of attaining a healthy weight is also discussed.     10/25/2021    4:12 PM 04/26/2021    1:46 PM 04/26/2021    1:28 PM 11/08/2020   10:35 AM 11/08/2020   10:03 AM 07/09/2020   10:16 AM 03/20/2020    8:51 AM  BP/Weight  Systolic BP 631 497 026 378 588 502 774  Diastolic BP 79 84 78 84 86 79 88  Wt. (Lbs) 186  183  189.12 196 197  BMI 30.02 kg/m2  30.45 kg/m2  31.47 kg/m2 32.62 kg/m2 31.8 kg/m2       Obesity (BMI 30.0-34.9) Unchanged  Patient re-educated about  the importance of commitment to a  minimum of 150 minutes of exercise per week as able.  The importance of healthy food choices with portion control discussed, as well as eating regularly and within a 12 hour window most days. The need to choose "clean , green" food 50 to 75% of the time is discussed, as well as to make water the primary drink and set a goal of 64 ounces water daily.       10/25/2021    4:12 PM 04/26/2021    1:28 PM 11/08/2020   10:03 AM  Weight /BMI  Weight 186 lb 183 lb 189 lb 1.9 oz  Height '5\' 6"'$  (1.676 m) '5\' 5"'$  (1.651 m) '5\' 5"'$  (1.651 m)  BMI 30.02 kg/m2 30.45 kg/m2 31.47 kg/m2      Insomnia  Controlled, no change in medication Sleep hygiene reviewed and written information offered also. Prescription sent for  medication needed.   Dyslipidemia, goal LDL below 100 Hyperlipidemia:Low fat diet discussed and encouraged.   Lipid Panel  Lab Results  Component Value Date   CHOL 227 (H) 07/09/2020   HDL 44 07/09/2020   LDLCALC 171 (H) 07/09/2020   TRIG 70 07/09/2020   CHOLHDL 5.2 (H) 07/09/2020   Updated lab needed at/ before next visit.     Allergic rhinitis Controlled, no change in medication

## 2021-10-26 NOTE — Assessment & Plan Note (Signed)
Controlled, no change in medication Sleep hygiene reviewed and written information offered also. Prescription sent for  medication needed.  

## 2021-10-26 NOTE — Assessment & Plan Note (Signed)
Controlled, no change in medication DASH diet and commitment to daily physical activity for a minimum of 30 minutes discussed and encouraged, as a part of hypertension management. The importance of attaining a healthy weight is also discussed.     10/25/2021    4:12 PM 04/26/2021    1:46 PM 04/26/2021    1:28 PM 11/08/2020   10:35 AM 11/08/2020   10:03 AM 07/09/2020   10:16 AM 03/20/2020    8:51 AM  BP/Weight  Systolic BP 631 497 026 378 588 502 774  Diastolic BP 79 84 78 84 86 79 88  Wt. (Lbs) 186  183  189.12 196 197  BMI 30.02 kg/m2  30.45 kg/m2  31.47 kg/m2 32.62 kg/m2 31.8 kg/m2

## 2021-10-26 NOTE — Assessment & Plan Note (Signed)
Controlled, no change in medication  

## 2021-10-28 DIAGNOSIS — E559 Vitamin D deficiency, unspecified: Secondary | ICD-10-CM | POA: Diagnosis not present

## 2021-10-28 DIAGNOSIS — E785 Hyperlipidemia, unspecified: Secondary | ICD-10-CM | POA: Diagnosis not present

## 2021-10-28 DIAGNOSIS — I10 Essential (primary) hypertension: Secondary | ICD-10-CM | POA: Diagnosis not present

## 2021-10-29 LAB — CMP14+EGFR
ALT: 10 IU/L (ref 0–32)
AST: 13 IU/L (ref 0–40)
Albumin/Globulin Ratio: 1.4 (ref 1.2–2.2)
Albumin: 4.2 g/dL (ref 3.8–4.9)
Alkaline Phosphatase: 79 IU/L (ref 44–121)
BUN/Creatinine Ratio: 11 (ref 9–23)
BUN: 12 mg/dL (ref 6–24)
Bilirubin Total: <0.2 mg/dL (ref 0.0–1.2)
CO2: 23 mmol/L (ref 20–29)
Calcium: 10.6 mg/dL — ABNORMAL HIGH (ref 8.7–10.2)
Chloride: 105 mmol/L (ref 96–106)
Creatinine, Ser: 1.06 mg/dL — ABNORMAL HIGH (ref 0.57–1.00)
Globulin, Total: 3 g/dL (ref 1.5–4.5)
Glucose: 86 mg/dL (ref 70–99)
Potassium: 4.2 mmol/L (ref 3.5–5.2)
Sodium: 140 mmol/L (ref 134–144)
Total Protein: 7.2 g/dL (ref 6.0–8.5)
eGFR: 63 mL/min/{1.73_m2} (ref >59)

## 2021-10-29 LAB — LIPID PANEL
Chol/HDL Ratio: 4.3 ratio (ref 0.0–4.4)
Cholesterol, Total: 230 mg/dL — ABNORMAL HIGH (ref 100–199)
HDL: 53 mg/dL (ref >39)
LDL Chol Calc (NIH): 162 mg/dL — ABNORMAL HIGH (ref 0–99)
Triglycerides: 88 mg/dL (ref 0–149)
VLDL Cholesterol Cal: 15 mg/dL (ref 5–40)

## 2021-10-29 LAB — CBC
Hematocrit: 38.7 % (ref 34.0–46.6)
Hemoglobin: 12.6 g/dL (ref 11.1–15.9)
MCH: 29 pg (ref 26.6–33.0)
MCHC: 32.6 g/dL (ref 31.5–35.7)
MCV: 89 fL (ref 79–97)
Platelets: 391 10*3/uL (ref 150–450)
RBC: 4.35 x10E6/uL (ref 3.77–5.28)
RDW: 13 % (ref 11.7–15.4)
WBC: 6.8 10*3/uL (ref 3.4–10.8)

## 2021-10-29 LAB — VITAMIN D 25 HYDROXY (VIT D DEFICIENCY, FRACTURES): Vit D, 25-Hydroxy: 20.1 ng/mL — ABNORMAL LOW (ref 30.0–100.0)

## 2021-10-29 LAB — TSH: TSH: 3.69 u[IU]/mL (ref 0.450–4.500)

## 2021-10-30 ENCOUNTER — Other Ambulatory Visit: Payer: Self-pay | Admitting: Family Medicine

## 2021-10-30 MED ORDER — ERGOCALCIFEROL 1.25 MG (50000 UT) PO CAPS: 50000.0000 [IU] | ORAL_CAPSULE | ORAL | 2 refills | Status: DC

## 2021-10-30 MED ORDER — ROSUVASTATIN CALCIUM 10 MG PO TABS: 10.0000 mg | ORAL_TABLET | Freq: Every day | ORAL | 1 refills | Status: DC

## 2021-11-13 ENCOUNTER — Encounter: Payer: Self-pay | Admitting: Family Medicine

## 2021-11-14 NOTE — Telephone Encounter (Signed)
Awaiting PA.

## 2021-11-29 MED ORDER — TEMAZEPAM 30 MG PO CAPS: 30.0000 mg | ORAL_CAPSULE | Freq: Every evening | ORAL | 2 refills | Status: DC | PRN

## 2022-02-08 ENCOUNTER — Other Ambulatory Visit: Payer: Self-pay | Admitting: Family Medicine

## 2022-02-25 ENCOUNTER — Ambulatory Visit: Payer: 59 | Admitting: Family Medicine

## 2022-03-13 ENCOUNTER — Encounter: Payer: Self-pay | Admitting: Radiology

## 2022-03-19 ENCOUNTER — Ambulatory Visit (INDEPENDENT_AMBULATORY_CARE_PROVIDER_SITE_OTHER): Payer: 59 | Admitting: Family Medicine

## 2022-03-19 ENCOUNTER — Encounter: Payer: Self-pay | Admitting: Family Medicine

## 2022-03-19 VITALS — BP 136/83 | HR 85 | Ht 65.0 in | Wt 190.0 lb

## 2022-03-19 DIAGNOSIS — Z1231 Encounter for screening mammogram for malignant neoplasm of breast: Secondary | ICD-10-CM | POA: Diagnosis not present

## 2022-03-19 DIAGNOSIS — E785 Hyperlipidemia, unspecified: Secondary | ICD-10-CM | POA: Diagnosis not present

## 2022-03-19 DIAGNOSIS — F5104 Psychophysiologic insomnia: Secondary | ICD-10-CM | POA: Diagnosis not present

## 2022-03-19 DIAGNOSIS — E559 Vitamin D deficiency, unspecified: Secondary | ICD-10-CM | POA: Diagnosis not present

## 2022-03-19 DIAGNOSIS — I10 Essential (primary) hypertension: Secondary | ICD-10-CM | POA: Diagnosis not present

## 2022-03-19 DIAGNOSIS — E669 Obesity, unspecified: Secondary | ICD-10-CM

## 2022-03-19 MED ORDER — HYDROXYZINE PAMOATE 50 MG PO CAPS: ORAL_CAPSULE | ORAL | 5 refills | Status: DC

## 2022-03-19 MED ORDER — PHENTERMINE HCL 37.5 MG PO TABS: 37.5000 mg | ORAL_TABLET | Freq: Every day | ORAL | 3 refills | Status: DC

## 2022-03-19 NOTE — Patient Instructions (Addendum)
Annual exam in 4 months, call if you need me sooner  Please schedule mammogram due in July at checkout  Fasting lipid, cmp and EGFr, tSH and vit D 3 to 6 days before next appt  New medication for sleep, hydroxyzine one to two capsules at bedtime  Weight loss  goal of 6 to 8 pounds  It is important that you exercise regularly at least 30 minutes 5 times a week. If you develop chest pain, have severe difficulty breathing, or feel very tired, stop exercising immediately and seek medical attention    Thanks for choosing [facility], we consider it a privelige to serve you.

## 2022-03-25 ENCOUNTER — Encounter: Payer: Self-pay | Admitting: Family Medicine

## 2022-03-25 NOTE — Assessment & Plan Note (Signed)
Uncontrolled, start hydroxyzine Sleep hygiene reviewed and written information offered also. Prescription sent for  medication needed.

## 2022-03-25 NOTE — Assessment & Plan Note (Signed)
  Patient re-educated about  the importance of commitment to a  minimum of 150 minutes of exercise per week as able.  The importance of healthy food choices with portion control discussed, as well as eating regularly and within a 12 hour window most days. The need to choose "clean , green" food 50 to 75% of the time is discussed, as well as to make water the primary drink and set a goal of 64 ounces water daily.       03/19/2022    2:51 PM 10/25/2021    4:12 PM 04/26/2021    1:28 PM  Weight /BMI  Weight 190 lb 186 lb 183 lb  Height 5\' 5"  (1.651 m) 5\' 6"  (1.676 m) 5\' 5"  (1.651 m)  BMI 31.62 kg/m2 30.02 kg/m2 30.45 kg/m2    worsened

## 2022-03-25 NOTE — Assessment & Plan Note (Signed)
Updated lab needed at/ before next visit.   

## 2022-03-25 NOTE — Progress Notes (Signed)
Chelsey Lewis     MRN: [id number]      DOB: 1970-12-03   HPI [name] is here for follow up and re-evaluation of chronic medical conditions, medication management and review of any available recent lab and radiology data.  Preventive health is updated, specifically  Cancer screening and Immunization.   Questions or concerns regarding consultations or procedures which the PT has had in the interim are  addressed. The PT denies any adverse reactions to current medications since the last visit.  C/o poor sleep and npo result from phentermine, however admits to sacking more I PM when she is wake Activity is at goal  ROS Denies recent fever or chills. Denies sinus pressure, nasal congestion, ear pain or sore throat. Denies chest congestion, productive cough or wheezing. Denies chest pains, palpitations and leg swelling Denies abdominal pain, nausea, vomiting,diarrhea or constipation.   Denies dysuria, frequency, hesitancy or incontinence. C/o left joint pain, swelling and limitation in mobility. Denies headaches, seizures, numbness, or tingling. Denies depression, anxiety Denies skin break down or rash.   PE  BP 136/83 (BP Location: Right Arm, Patient Position: Sitting, Cuff Size: Large)   Pulse 85   Ht '5\' 5"'$  (1.651 m)   Wt 190 lb (86.2 kg)   LMP 10/11/2015 (Approximate)   SpO2 96%   BMI 31.62 kg/m   Patient alert and oriented and in no cardiopulmonary distress.  HEENT: No facial asymmetry, EOMI,     Neck supple .  Chest: Clear to auscultation bilaterally.  CVS: S1, S2 no murmurs, no S3.Regular rate.  ABD: Soft non tender.   Ext: No edema  MS: Adequate ROM spine, shoulders, hips and decreased in left knees.  Skin: Intact, no ulcerations or rash noted.  Psych: Good eye contact, normal affect. Memory intact not anxious or depressed appearing.  CNS: CN 2-12 intact, power,  normal throughout.no focal deficits noted.   Assessment & Plan  Essential  hypertension Controlled, no change in medication DASH diet and commitment to daily physical activity for a minimum of 30 minutes discussed and encouraged, as a part of hypertension management. The importance of attaining a healthy weight is also discussed.     03/19/2022    2:51 PM 10/25/2021    4:12 PM 04/26/2021    1:46 PM 04/26/2021    1:28 PM 11/08/2020   10:35 AM 11/08/2020   10:03 AM 07/09/2020   10:16 AM  BP/Weight  Systolic BP [id number] 0000000 123456 [id number] 0000000 123456 123456  Diastolic BP 83 79 84 78 84 86 79  Wt. (Lbs) 190 186  183  189.12 196  BMI 31.62 kg/m2 30.02 kg/m2  30.45 kg/m2  31.47 kg/m2 32.62 kg/m2       Dyslipidemia, goal LDL below 100 Hyperlipidemia:Low fat diet discussed and encouraged.   Lipid Panel  Lab Results  Component Value Date   CHOL 230 (H) 10/28/2021   HDL 53 10/28/2021   LDLCALC 162 (H) 10/28/2021   TRIG 88 10/28/2021   CHOLHDL 4.3 10/28/2021     Updated lab needed at/ before next visit.   Obesity (BMI 30.0-34.9)  Patient re-educated about  the importance of commitment to a  minimum of 150 minutes of exercise per week as able.  The importance of healthy food choices with portion control discussed, as well as eating regularly and within a 12 hour window most days. The need to choose "clean , green" food 50 to 75% of the time is discussed, as well as to make  water the primary drink and set a goal of 64 ounces water daily.       03/19/2022    2:51 PM 10/25/2021    4:12 PM 04/26/2021    1:28 PM  Weight /BMI  Weight 190 lb 186 lb 183 lb  Height '5\' 5"'$  (1.651 m) '5\' 6"'$  (1.676 m) '5\' 5"'$  (1.651 m)  BMI 31.62 kg/m2 30.02 kg/m2 30.45 kg/m2    worsened  Vitamin D deficiency Updated lab needed at/ before next visit.   Insomnia Uncontrolled, start hydroxyzine Sleep hygiene reviewed and written information offered also. Prescription sent for  medication needed.

## 2022-03-25 NOTE — Assessment & Plan Note (Signed)
Controlled, no change in medication DASH diet and commitment to daily physical activity for a minimum of 30 minutes discussed and encouraged, as a part of hypertension management. The importance of attaining a healthy weight is also discussed.     03/19/2022    2:51 PM 10/25/2021    4:12 PM 04/26/2021    1:46 PM 04/26/2021    1:28 PM 11/08/2020   10:35 AM 11/08/2020   10:03 AM 07/09/2020   10:16 AM  BP/Weight  Systolic BP [id number] 0000000 123456 [id number] 0000000 123456 123456  Diastolic BP 83 79 84 78 84 86 79  Wt. (Lbs) 190 186  183  189.12 196  BMI 31.62 kg/m2 30.02 kg/m2  30.45 kg/m2  31.47 kg/m2 32.62 kg/m2

## 2022-03-25 NOTE — Assessment & Plan Note (Signed)
Hyperlipidemia:Low fat diet discussed and encouraged.   Lipid Panel  Lab Results  Component Value Date   CHOL 230 (H) 10/28/2021   HDL 53 10/28/2021   LDLCALC 162 (H) 10/28/2021   TRIG 88 10/28/2021   CHOLHDL 4.3 10/28/2021     Updated lab needed at/ before next visit.

## 2022-04-16 ENCOUNTER — Other Ambulatory Visit: Payer: Self-pay

## 2022-04-16 ENCOUNTER — Encounter: Payer: Self-pay | Admitting: Family Medicine

## 2022-04-16 MED ORDER — AZELASTINE HCL 0.1 % NA SOLN: 2.0000 | Freq: Two times a day (BID) | NASAL | 12 refills | Status: DC

## 2022-06-02 IMAGING — MG DIGITAL SCREENING BILAT W/ TOMO W/ CAD
8 series · 8 of 24 positions shown · non-contrast
Comparison: Previous exam(s).

CLINICAL DATA: Screening.

EXAM:
DIGITAL SCREENING BILATERAL MAMMOGRAM WITH TOMO AND CAD

[R CC synth-2D]
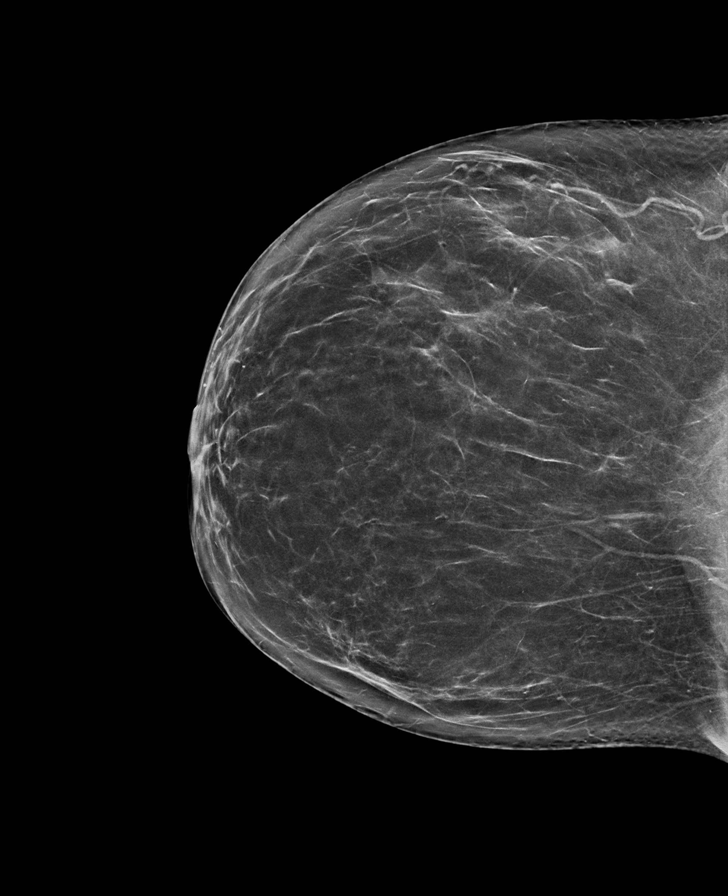

[L CC synth-2D]
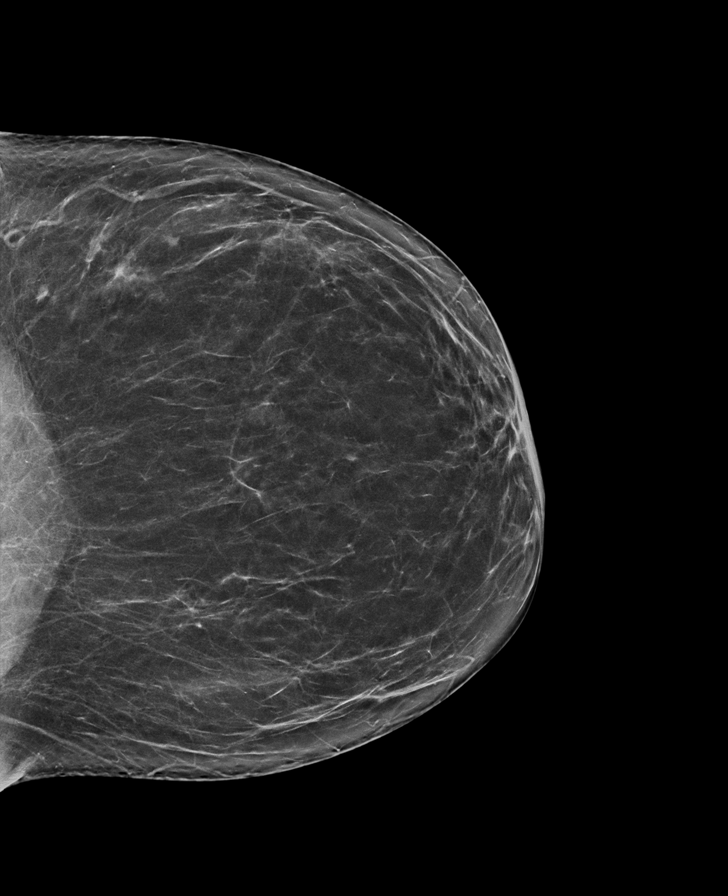

[L MLO synth-2D]
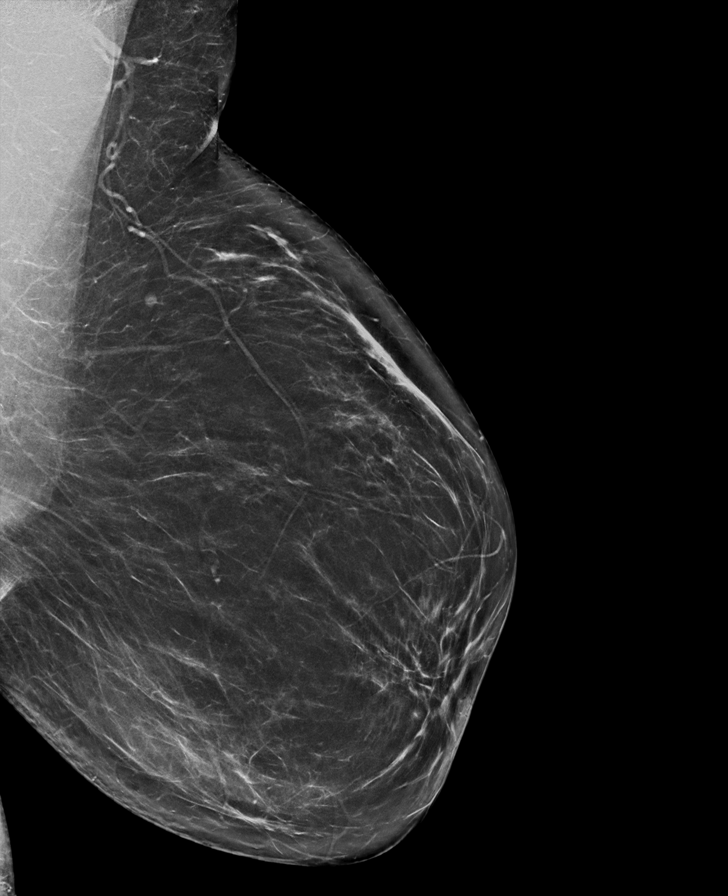

[R MLO synth-2D]
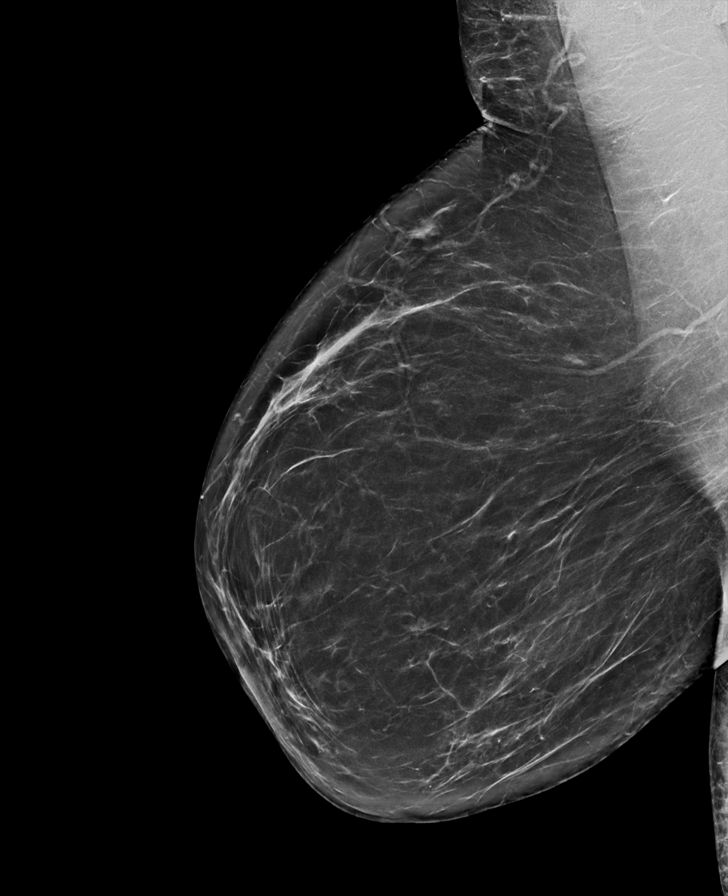

[L CC tomo · tomo slice 36/71.0]
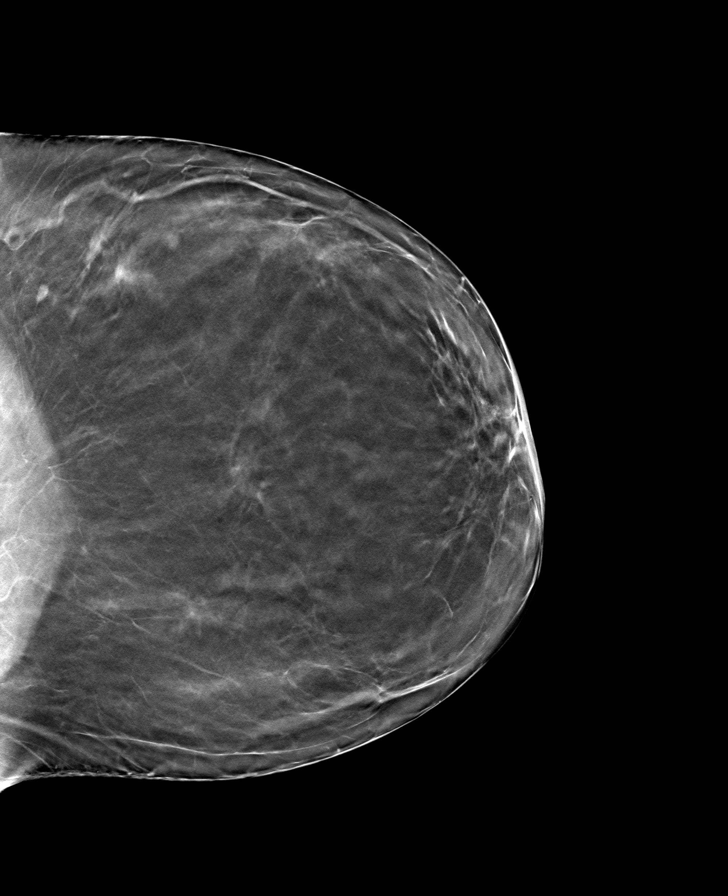

[R CC tomo · tomo slice 35/70.0]
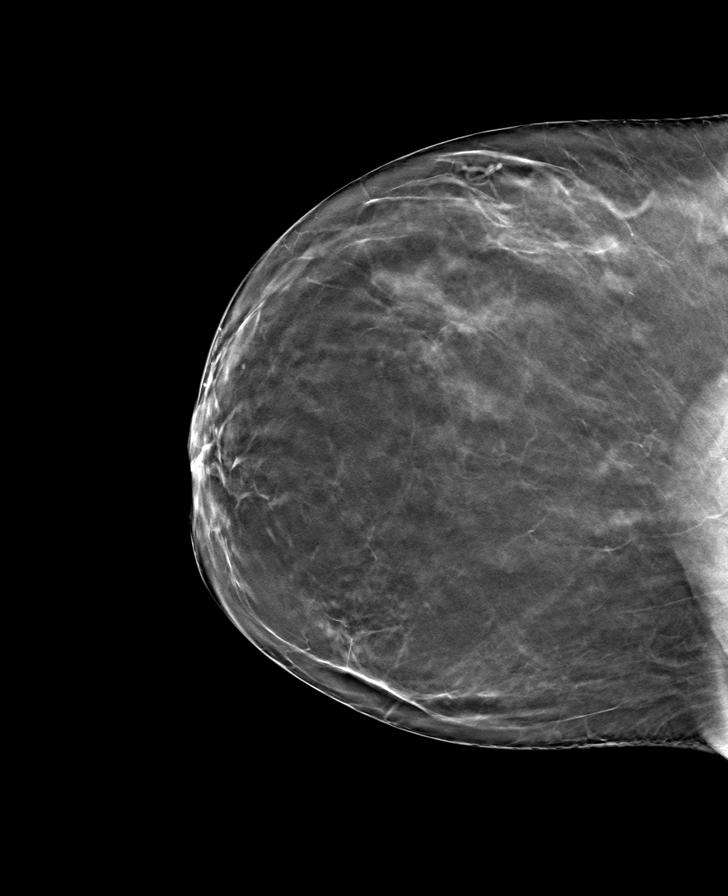

[L MLO tomo · tomo slice 39/77.0]
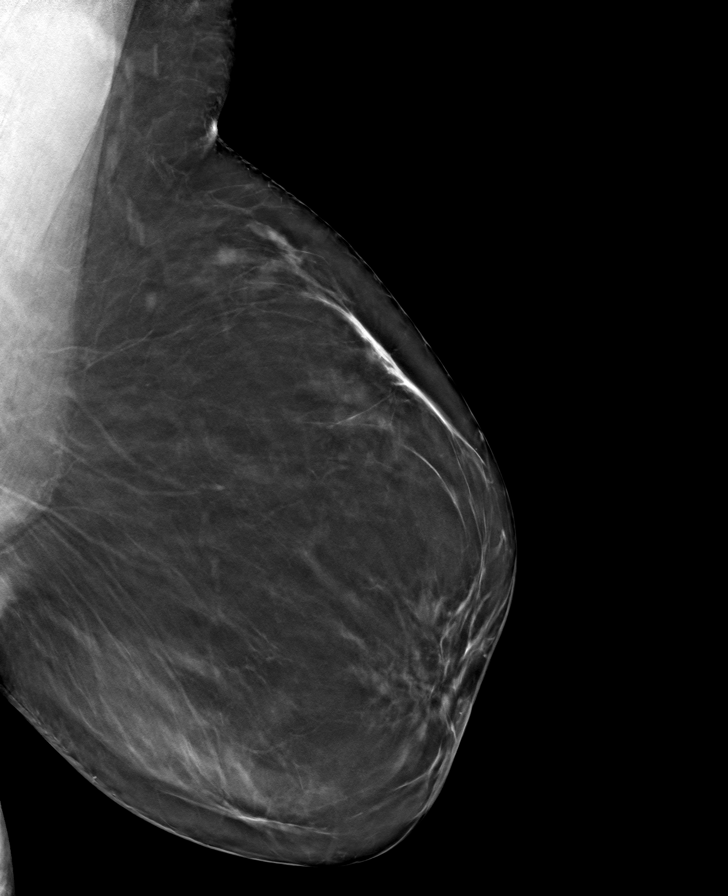

[R MLO tomo · tomo slice 40/79.0]
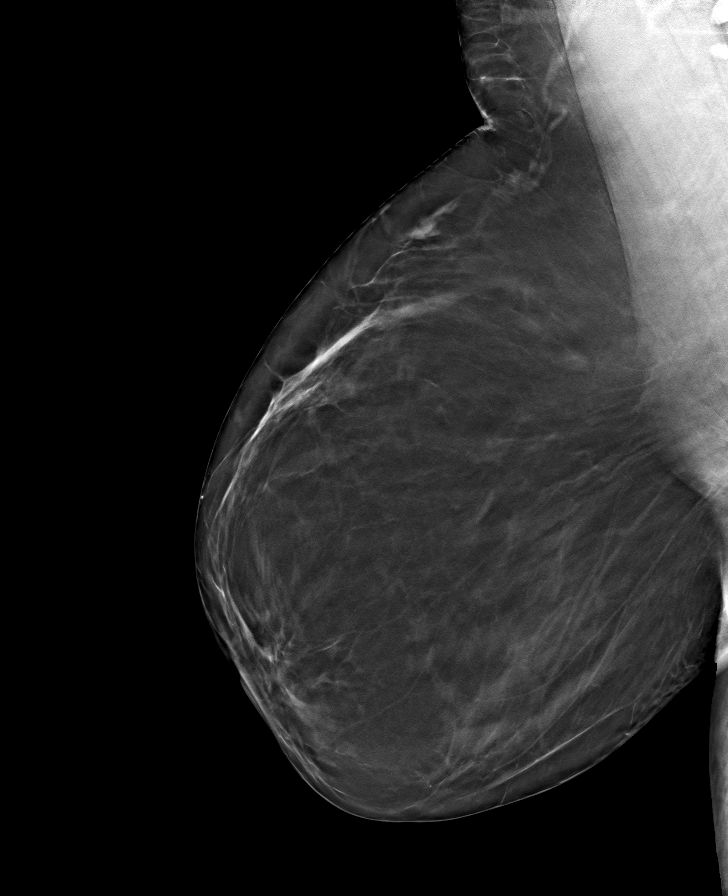

[8 of 24 positions shown; findings below may reference images not displayed]

ACR Breast Density Category b: There are scattered areas of
fibroglandular density.
FINDINGS: There are no findings suspicious for malignancy. Images were
processed with CAD.
IMPRESSION: No mammographic evidence of malignancy. A result letter of this
screening mammogram will be mailed directly to the patient.

RECOMMENDATION:
Screening mammogram in one year. (Code:CN-U-775)

BI-RADS CATEGORY  1: Negative.

## 2022-06-06 ENCOUNTER — Other Ambulatory Visit: Payer: Self-pay | Admitting: Family Medicine

## 2022-07-23 ENCOUNTER — Encounter: Payer: 59 | Admitting: Family Medicine

## 2022-08-08 ENCOUNTER — Ambulatory Visit (HOSPITAL_COMMUNITY): Payer: 59

## 2022-08-21 ENCOUNTER — Encounter: Payer: 59 | Admitting: Family Medicine

## 2022-08-25 ENCOUNTER — Ambulatory Visit (HOSPITAL_COMMUNITY)
Admission: RE | Admit: 2022-08-25 | Discharge: 2022-08-25 | Disposition: A | Discharge status: Home / Self Care | Payer: 59 | Source: Ambulatory Visit | Attending: Family Medicine | Admitting: Family Medicine

## 2022-08-25 ENCOUNTER — Encounter (HOSPITAL_COMMUNITY): Payer: Self-pay

## 2022-08-25 DIAGNOSIS — Z1231 Encounter for screening mammogram for malignant neoplasm of breast: Secondary | ICD-10-CM | POA: Diagnosis not present

## 2022-09-09 ENCOUNTER — Encounter: Payer: Self-pay | Admitting: Family Medicine

## 2022-09-09 ENCOUNTER — Telehealth (INDEPENDENT_AMBULATORY_CARE_PROVIDER_SITE_OTHER): Payer: 59 | Admitting: Family Medicine

## 2022-09-09 DIAGNOSIS — U071 COVID-19: Secondary | ICD-10-CM | POA: Insufficient documentation

## 2022-09-09 NOTE — Assessment & Plan Note (Signed)
Acute;ly infected 1 week ago, dx  4 days ago with home test, improving incrementally Suppoertivr measure and sufficient rest  for full recovey

## 2022-09-09 NOTE — Patient Instructions (Signed)
F/u as before, call if you need me sooner  Your infection started 8 days ago based on your history and you are improved  No indication FOR TREATMENT WITH ANTIVIRAL MEDICATION BASED ON YOUR SYMPTOMS AND HISTORY  Keep well hydrated with water, ensure you make healthy food choices and get sufficient rest.Wearing a mask in the public spaces is a Producer, television/film/video , tis the season!

## 2022-09-09 NOTE — Progress Notes (Signed)
Virtual Visit via Video Note  I connected with Chelsey Lewis on 09/09/22 at 10:00 AM EDT by a video enabled telemedicine application and verified that I am speaking with the correct person using two identifiers.  Location: Patient: home Provider: office   I discussed the limitations of evaluation and management by telemedicine and the availability of in person appointments. The patient expressed understanding and agreed to proceed.  History of Present Illness: Acutely ill on 09/03/2022, head congestion, scratchy throat and cough, chills and fever max on 8/22, extreme fatigue , lay in bed all of 8/22, no energy Never had difficulty breathing, energy and apetite are ove r60 % back to normal Covis test positive 09/08/2022 Denies fever in pat 48 hours   Observations/Objective: LMP 10/11/2015 (Approximate)  Good communication with no confusion and intact memory. Alert and oriented x 3 No signs of respiratory distress during speech   Assessment and Plan: Positive self-administered antigen test for COVID-19 Acute;ly infected 1 week ago, dx  4 days ago with home test, improving incrementally Suppoertivr measure and sufficient rest  for full recovey   Follow Up Instructions:    I discussed the assessment and treatment plan with the patient. The patient was provided an opportunity to ask questions and all were answered. The patient agreed with the plan and demonstrated an understanding of the instructions.   The patient was advised to call back or seek an in-person evaluation if the symptoms worsen or if the condition fails to improve as anticipated.  I provided 10 minutes of non-face-to-face time during this encounter.   Syliva Overman, MD

## 2022-09-14 ENCOUNTER — Other Ambulatory Visit: Payer: Self-pay | Admitting: Family Medicine

## 2022-09-19 ENCOUNTER — Other Ambulatory Visit: Payer: Self-pay | Admitting: Family Medicine

## 2022-09-26 ENCOUNTER — Other Ambulatory Visit: Payer: Self-pay | Admitting: Family Medicine

## 2022-10-19 ENCOUNTER — Other Ambulatory Visit: Payer: Self-pay | Admitting: Family Medicine

## 2022-11-05 ENCOUNTER — Other Ambulatory Visit: Payer: Self-pay | Admitting: Family Medicine

## 2022-11-05 ENCOUNTER — Encounter: Payer: Self-pay | Admitting: Family Medicine

## 2022-11-05 ENCOUNTER — Ambulatory Visit (INDEPENDENT_AMBULATORY_CARE_PROVIDER_SITE_OTHER): Payer: Managed Care, Other (non HMO) | Admitting: Family Medicine

## 2022-11-05 VITALS — BP 135/78 | HR 104 | Ht 65.0 in | Wt 200.0 lb

## 2022-11-05 DIAGNOSIS — E049 Nontoxic goiter, unspecified: Secondary | ICD-10-CM

## 2022-11-05 DIAGNOSIS — N1831 Chronic kidney disease, stage 3a: Secondary | ICD-10-CM

## 2022-11-05 DIAGNOSIS — D126 Benign neoplasm of colon, unspecified: Secondary | ICD-10-CM | POA: Insufficient documentation

## 2022-11-05 DIAGNOSIS — E559 Vitamin D deficiency, unspecified: Secondary | ICD-10-CM

## 2022-11-05 DIAGNOSIS — Z0001 Encounter for general adult medical examination with abnormal findings: Secondary | ICD-10-CM | POA: Diagnosis not present

## 2022-11-05 DIAGNOSIS — I10 Essential (primary) hypertension: Secondary | ICD-10-CM

## 2022-11-05 DIAGNOSIS — Z23 Encounter for immunization: Secondary | ICD-10-CM | POA: Diagnosis not present

## 2022-11-05 DIAGNOSIS — E785 Hyperlipidemia, unspecified: Secondary | ICD-10-CM

## 2022-11-05 DIAGNOSIS — E66811 Obesity, class 1: Secondary | ICD-10-CM

## 2022-11-05 MED ORDER — SEMAGLUTIDE-WEIGHT MANAGEMENT 1 MG/0.5ML ~~LOC~~ SOAJ: 1.0000 mg | SUBCUTANEOUS | 0 refills | Status: DC

## 2022-11-05 MED ORDER — SEMAGLUTIDE-WEIGHT MANAGEMENT 0.5 MG/0.5ML ~~LOC~~ SOAJ: 0.5000 mg | SUBCUTANEOUS | 0 refills | Status: DC

## 2022-11-05 MED ORDER — SEMAGLUTIDE-WEIGHT MANAGEMENT 0.25 MG/0.5ML ~~LOC~~ SOAJ: 0.2500 mg | SUBCUTANEOUS | 0 refills | Status: DC

## 2022-11-05 MED ORDER — HYDROXYZINE PAMOATE 50 MG PO CAPS: ORAL_CAPSULE | ORAL | 5 refills | Status: DC

## 2022-11-05 NOTE — Patient Instructions (Signed)
F/U in 12 weeks, call if you need me sooner  New for weight loss is once weekly wegovy, in increasing doses  Labs today already ordered , need to be re ordered and add CBC and     Flu vaccine today  Please get covid vaccine at the pharmacy  It is important that you exercise regularly at least 30 minutes 5 times a week. If you develop chest pain, have severe difficulty breathing, or feel very tired, stop exercising immediately and seek medical attention   Think about what you will eat, plan ahead. Choose " clean, green, fresh or frozen" over canned, processed or packaged foods which are more sugary, salty and fatty. 70 to 75% of food eaten should be vegetables and fruit. Three meals at set times with snacks allowed between meals, but they must be fruit or vegetables. Aim to eat over a 12 hour period , example 7 am to 7 pm, and STOP after  your last meal of the day. Drink water,generally about 64 ounces per day, no other drink is as healthy. Fruit juice is best enjoyed in a healthy way, by EATING the fruit. Thanks for choosing [facility], we consider it a privelige to serve you.

## 2022-11-05 NOTE — Progress Notes (Signed)
    BERT BARNABY     MRN: [id number]      DOB: 09-10-1970  Chief Complaint  Patient presents with   Annual Exam    CPE     HPI: Patient is in for annual physical exam. Obesity is  addressed Immunization is reviewed , and  updated if needed.   PE: BP 135/78 (BP Location: Right Arm, Patient Position: Sitting, Cuff Size: Large)   Pulse (!) 104   Ht 5\' 5"  (1.651 m)   Wt 200 lb (90.7 kg)   LMP 10/11/2015 (Approximate)   SpO2 96%   BMI 33.28 kg/m   Pleasant  female, alert and oriented x 3, in no cardio-pulmonary distress. Afebrile. HEENT No facial trauma or asymetry. Sinuses non tender.  Extra occullar muscles intact.. External ears normal, . Neck: supple, no adenopathy,JVD or thyromegaly.No bruits.  Chest: Clear to ascultation bilaterally.No crackles or wheezes. Non tender to palpation  Cardiovascular system; Heart sounds normal,  S1 and  S2 ,no S3.  No murmur, or thrill. Apical beat not displaced Peripheral pulses normal.  Abdomen: Soft, non tender.    Musculoskeletal exam: Full ROM of spine, hips , shoulders and knees. No deformity ,swelling or crepitus noted. No muscle wasting or atrophy.   Neurologic: Cranial nerves 2 to 12 intact. Power, tone ,sensation  normal throughout. No disturbance in gait. No tremor.  Skin: Intact, no ulceration, erythema , scaling or rash noted. Pigmentation normal throughout  Psych; Normal mood and affect. Judgement and concentration normal   Assessment & Plan:  Annual visit for general adult medical examination with abnormal findings Annual exam as documented. Counseling done  re healthy lifestyle involving commitment to 150 minutes exercise per week, heart healthy diet, and attaining healthy weight.The importance of adequate sleep also discussed.  Changes in health habits are decided on by the patient with goals and time frames  set for achieving them. Immunization and cancer screening needs are specifically addressed  at this visit.   Goiter  Patient re-educated about  the importance of commitment to a  minimum of 150 minutes of exercise per week as able.  The importance of healthy food choices with portion control discussed, as well as eating regularly and within a 12 hour window most days. The need to choose "clean , green" food 50 to 75% of the time is discussed, as well as to make water the primary drink and set a goal of 64 ounces water daily.       11/05/2022    3:06 PM 03/19/2022    2:51 PM 10/25/2021    4:12 PM  Weight /BMI  Weight 200 lb 190 lb 186 lb  Height 5\' 5"  (1.651 m) 5\' 5"  (1.651 m) 5\' 6"  (1.676 m)  BMI 33.28 kg/m2 31.62 kg/m2 30.02 kg/m2    Statr wegovy, not covered and zepbound prescribed will f/u to see if covered  Encounter for immunization After obtaining informed consent, the influenza vaccine is  administered , with no adverse effect noted at the time of administration.   Hypercalcemia Refer nephrology to eval and manage  Stage 3a chronic kidney disease (HCC) New , refer Nephrology for eval and management

## 2022-11-06 LAB — CMP14+EGFR
ALT: 16 [IU]/L (ref 0–32)
AST: 19 [IU]/L (ref 0–40)
Albumin: 4.3 g/dL (ref 3.8–4.9)
Alkaline Phosphatase: 71 [IU]/L (ref 44–121)
BUN/Creatinine Ratio: 6 — ABNORMAL LOW (ref 9–23)
BUN: 7 mg/dL (ref 6–24)
Bilirubin Total: <0.2 mg/dL (ref 0.0–1.2)
CO2: 21 mmol/L (ref 20–29)
Calcium: 10.6 mg/dL — ABNORMAL HIGH (ref 8.7–10.2)
Chloride: 106 mmol/L (ref 96–106)
Creatinine, Ser: 1.24 mg/dL — ABNORMAL HIGH (ref 0.57–1.00)
Globulin, Total: 2.4 g/dL (ref 1.5–4.5)
Glucose: 83 mg/dL (ref 70–99)
Potassium: 4.3 mmol/L (ref 3.5–5.2)
Sodium: 141 mmol/L (ref 134–144)
Total Protein: 6.7 g/dL (ref 6.0–8.5)
eGFR: 52 mL/min/{1.73_m2} — ABNORMAL LOW (ref >59)

## 2022-11-06 LAB — LIPID PANEL
Chol/HDL Ratio: 3.3 ratio (ref 0.0–4.4)
Cholesterol, Total: 143 mg/dL (ref 100–199)
HDL: 44 mg/dL (ref >39)
LDL Chol Calc (NIH): 83 mg/dL (ref 0–99)
Triglycerides: 81 mg/dL (ref 0–149)
VLDL Cholesterol Cal: 16 mg/dL (ref 5–40)

## 2022-11-06 LAB — CBC
Hematocrit: 35.1 % (ref 34.0–46.6)
Hemoglobin: 11.4 g/dL (ref 11.1–15.9)
MCH: 28.6 pg (ref 26.6–33.0)
MCHC: 32.5 g/dL (ref 31.5–35.7)
MCV: 88 fL (ref 79–97)
Platelets: 402 10*3/uL (ref 150–450)
RBC: 3.98 x10E6/uL (ref 3.77–5.28)
RDW: 13.1 % (ref 11.7–15.4)
WBC: 7.4 10*3/uL (ref 3.4–10.8)

## 2022-11-06 LAB — TSH: TSH: 1.85 u[IU]/mL (ref 0.450–4.500)

## 2022-11-06 LAB — VITAMIN D 25 HYDROXY (VIT D DEFICIENCY, FRACTURES): Vit D, 25-Hydroxy: 40.9 ng/mL (ref 30.0–100.0)

## 2022-11-06 MED ORDER — TIRZEPATIDE-WEIGHT MANAGEMENT 2.5 MG/0.5ML ~~LOC~~ SOLN: 2.5000 mg | SUBCUTANEOUS | 0 refills | Status: DC

## 2022-11-10 DIAGNOSIS — Z23 Encounter for immunization: Secondary | ICD-10-CM | POA: Insufficient documentation

## 2022-11-10 DIAGNOSIS — N1831 Chronic kidney disease, stage 3a: Secondary | ICD-10-CM | POA: Insufficient documentation

## 2022-11-10 DIAGNOSIS — Z0001 Encounter for general adult medical examination with abnormal findings: Secondary | ICD-10-CM | POA: Insufficient documentation

## 2022-11-10 NOTE — Assessment & Plan Note (Signed)
New , refer Nephrology for eval and management

## 2022-11-10 NOTE — Assessment & Plan Note (Signed)
Annual exam as documented. Counseling done  re healthy lifestyle involving commitment to 150 minutes exercise per week, heart healthy diet, and attaining healthy weight.The importance of adequate sleep also discussed. Changes in health habits are decided on by the patient with goals and time frames  set for achieving them. Immunization and cancer screening needs are specifically addressed at this visit. 

## 2022-11-10 NOTE — Assessment & Plan Note (Signed)
After obtaining informed consent, the influenza vaccine is  administered , with no adverse effect noted at the time of administration.

## 2022-11-10 NOTE — Assessment & Plan Note (Signed)
Refer nephrology to eval and manage

## 2022-11-10 NOTE — Progress Notes (Incomplete)
    Chelsey Lewis     MRN: [id number]      DOB: 05/02/1970  Chief Complaint  Patient presents with  . Annual Exam    CPE     HPI: Patient is in for annual physical exam. Obesity is  addresed Immunization is reviewed , and  updated if needed.   PE: BP 135/78 (BP Location: Right Arm, Patient Position: Sitting, Cuff Size: Large)   Pulse (!) 104   Ht 5\' 5"  (1.651 m)   Wt 200 lb (90.7 kg)   LMP 10/11/2015 (Approximate)   SpO2 96%   BMI 33.28 kg/m   Pleasant  female, alert and oriented x 3, in no cardio-pulmonary distress. Afebrile. HEENT No facial trauma or asymetry. Sinuses non tender.  Extra occullar muscles intact.. External ears normal, . Neck: supple, no adenopathy,JVD or thyromegaly.No bruits.  Chest: Clear to ascultation bilaterally.No crackles or wheezes. Non tender to palpation  Cardiovascular system; Heart sounds normal,  S1 and  S2 ,no S3.  No murmur, or thrill. Apical beat not displaced Peripheral pulses normal.  Abdomen: Soft, non tender.    Musculoskeletal exam: Full ROM of spine, hips , shoulders and knees. No deformity ,swelling or crepitus noted. No muscle wasting or atrophy.   Neurologic: Cranial nerves 2 to 12 intact. Power, tone ,sensation  normal throughout. No disturbance in gait. No tremor.  Skin: Intact, no ulceration, erythema , scaling or rash noted. Pigmentation normal throughout  Psych; Normal mood and affect. Judgement and concentration normal   Assessment & Plan:  No problem-specific Assessment & Plan notes found for this encounter.

## 2022-11-10 NOTE — Assessment & Plan Note (Signed)
  Patient re-educated about  the importance of commitment to a  minimum of 150 minutes of exercise per week as able.  The importance of healthy food choices with portion control discussed, as well as eating regularly and within a 12 hour window most days. The need to choose "clean , green" food 50 to 75% of the time is discussed, as well as to make water the primary drink and set a goal of 64 ounces water daily.       11/05/2022    3:06 PM 03/19/2022    2:51 PM 10/25/2021    4:12 PM  Weight /BMI  Weight 200 lb 190 lb 186 lb  Height 5\' 5"  (1.651 m) 5\' 5"  (1.651 m) 5\' 6"  (1.676 m)  BMI 33.28 kg/m2 31.62 kg/m2 30.02 kg/m2    Statr wegovy, not covered and zepbound prescribed will f/u to see if covered

## 2022-11-11 ENCOUNTER — Other Ambulatory Visit: Payer: Self-pay

## 2022-11-11 ENCOUNTER — Telehealth: Payer: Self-pay | Admitting: Family Medicine

## 2022-11-11 MED ORDER — TIRZEPATIDE-WEIGHT MANAGEMENT 2.5 MG/0.5ML ~~LOC~~ SOLN: 2.5000 mg | SUBCUTANEOUS | 0 refills | Status: DC

## 2022-11-11 NOTE — Telephone Encounter (Signed)
Patient called in regard to weight loss medication   Pt needs Brand name Monjaro sent in  to pharm . Will not be covered under any other name   Pt also needs pre auth on medication. Fax [phone] phone [phone] \  Pt wants a call back

## 2022-11-12 ENCOUNTER — Encounter: Payer: Self-pay | Admitting: Family Medicine

## 2022-11-19 ENCOUNTER — Other Ambulatory Visit: Payer: Self-pay | Admitting: Family Medicine

## 2022-11-23 ENCOUNTER — Other Ambulatory Visit: Payer: Self-pay | Admitting: Family Medicine

## 2022-12-04 DIAGNOSIS — R8290 Unspecified abnormal findings in urine: Secondary | ICD-10-CM | POA: Diagnosis not present

## 2022-12-04 DIAGNOSIS — N1831 Chronic kidney disease, stage 3a: Secondary | ICD-10-CM | POA: Diagnosis not present

## 2022-12-04 DIAGNOSIS — R809 Proteinuria, unspecified: Secondary | ICD-10-CM | POA: Diagnosis not present

## 2022-12-05 ENCOUNTER — Other Ambulatory Visit (HOSPITAL_COMMUNITY): Payer: Self-pay | Admitting: Nephrology

## 2022-12-05 DIAGNOSIS — R8290 Unspecified abnormal findings in urine: Secondary | ICD-10-CM

## 2022-12-05 DIAGNOSIS — N1831 Chronic kidney disease, stage 3a: Secondary | ICD-10-CM

## 2022-12-15 ENCOUNTER — Ambulatory Visit (HOSPITAL_COMMUNITY)
Admission: RE | Admit: 2022-12-15 | Discharge: 2022-12-15 | Disposition: A | Discharge status: Home / Self Care | Payer: 59 | Source: Ambulatory Visit | Attending: Nephrology | Admitting: Nephrology

## 2022-12-15 DIAGNOSIS — N1831 Chronic kidney disease, stage 3a: Secondary | ICD-10-CM | POA: Diagnosis not present

## 2022-12-15 DIAGNOSIS — N189 Chronic kidney disease, unspecified: Secondary | ICD-10-CM | POA: Diagnosis not present

## 2022-12-15 DIAGNOSIS — R8290 Unspecified abnormal findings in urine: Secondary | ICD-10-CM | POA: Insufficient documentation

## 2022-12-25 DIAGNOSIS — E210 Primary hyperparathyroidism: Secondary | ICD-10-CM | POA: Diagnosis not present

## 2022-12-30 ENCOUNTER — Other Ambulatory Visit (HOSPITAL_COMMUNITY): Payer: Self-pay | Admitting: Nephrology

## 2022-12-30 DIAGNOSIS — E210 Primary hyperparathyroidism: Secondary | ICD-10-CM

## 2023-01-08 ENCOUNTER — Ambulatory Visit (HOSPITAL_COMMUNITY)
Admission: RE | Admit: 2023-01-08 | Discharge: 2023-01-08 | Disposition: A | Discharge status: Home / Self Care | Payer: 59 | Source: Ambulatory Visit | Attending: Nephrology | Admitting: Nephrology

## 2023-01-08 DIAGNOSIS — E210 Primary hyperparathyroidism: Secondary | ICD-10-CM | POA: Insufficient documentation

## 2023-01-08 MED ORDER — TECHNETIUM TC 99M SESTAMIBI - CARDIOLITE
25.3000 | Freq: Once | INTRAVENOUS | Status: AC | PRN
  Administered 2023-01-08: 25.3 via INTRAVENOUS

## 2023-01-15 ENCOUNTER — Encounter: Payer: Self-pay | Admitting: Family Medicine

## 2023-01-16 ENCOUNTER — Other Ambulatory Visit: Payer: Self-pay | Admitting: Family Medicine

## 2023-01-16 MED ORDER — TIRZEPATIDE 2.5 MG/0.5ML ~~LOC~~ SOAJ: 2.5000 mg | SUBCUTANEOUS | 0 refills | Status: DC

## 2023-01-27 ENCOUNTER — Other Ambulatory Visit: Payer: Self-pay

## 2023-01-27 MED ORDER — TIRZEPATIDE 2.5 MG/0.5ML ~~LOC~~ SOAJ: 2.5000 mg | SUBCUTANEOUS | 0 refills | Status: DC

## 2023-03-05 ENCOUNTER — Telehealth: Payer: Self-pay | Admitting: Family Medicine

## 2023-03-05 NOTE — Telephone Encounter (Signed)
 Office notes sent with form

## 2023-03-05 NOTE — Telephone Encounter (Signed)
 There is a request from [place] for most recent office note to be sent  Need documentation from pt that she wants her medical record sent . I have left the letter requesting the information in your box. Pls follow through Thanks

## 2023-04-08 ENCOUNTER — Ambulatory Visit: Payer: 59 | Admitting: Family Medicine

## 2023-06-12 ENCOUNTER — Encounter: Payer: Self-pay | Admitting: Family Medicine

## 2023-06-12 ENCOUNTER — Ambulatory Visit (INDEPENDENT_AMBULATORY_CARE_PROVIDER_SITE_OTHER): Admitting: Family Medicine

## 2023-06-12 VITALS — BP 117/80 | HR 78 | Resp 16 | Ht 66.0 in | Wt 213.0 lb

## 2023-06-12 DIAGNOSIS — E66811 Obesity, class 1: Secondary | ICD-10-CM

## 2023-06-12 DIAGNOSIS — E559 Vitamin D deficiency, unspecified: Secondary | ICD-10-CM

## 2023-06-12 DIAGNOSIS — E785 Hyperlipidemia, unspecified: Secondary | ICD-10-CM

## 2023-06-12 DIAGNOSIS — N1831 Chronic kidney disease, stage 3a: Secondary | ICD-10-CM

## 2023-06-12 DIAGNOSIS — Z1231 Encounter for screening mammogram for malignant neoplasm of breast: Secondary | ICD-10-CM

## 2023-06-12 DIAGNOSIS — I10 Essential (primary) hypertension: Secondary | ICD-10-CM

## 2023-06-12 DIAGNOSIS — D126 Benign neoplasm of colon, unspecified: Secondary | ICD-10-CM

## 2023-06-12 MED ORDER — PHENTERMINE HCL 37.5 MG PO TABS: ORAL_TABLET | ORAL | 0 refills | Status: DC

## 2023-06-12 NOTE — Patient Instructions (Addendum)
 F/U in 11 weeks, call if you need me sooner  New is phentermine  half tablet daily  Please work on food choice and exercise  Please schedule mammogram at checkout  Fasting labs 3 to 5 days before next appointment, cBC, lipid, cmp and EGFR, TSH and vit D, nurse pls order  Thanks for choosing [facility], we consider it a privelige to serve you.

## 2023-06-14 ENCOUNTER — Encounter: Payer: Self-pay | Admitting: Family Medicine

## 2023-06-14 NOTE — Assessment & Plan Note (Signed)
 Controlled, no change in medication DASH diet and commitment to daily physical activity for a minimum of 30 minutes discussed and encouraged, as a part of hypertension management. The importance of attaining a healthy weight is also discussed.     06/12/2023    3:47 PM 11/05/2022    3:06 PM 03/19/2022    2:51 PM 10/25/2021    4:12 PM 04/26/2021    1:46 PM 04/26/2021    1:28 PM 11/08/2020   10:35 AM  BP/Weight  Systolic BP 117 135 136 125 120 114 128  Diastolic BP 80 78 83 79 84 78 84  Wt. (Lbs) 213 200 190 186  183   BMI 34.38 kg/m2 33.28 kg/m2 31.62 kg/m2 30.02 kg/m2  30.45 kg/m2

## 2023-06-14 NOTE — Assessment & Plan Note (Signed)
 Updated lab needed at/ before next visit.

## 2023-06-14 NOTE — Assessment & Plan Note (Signed)
  Patient re-educated about  the importance of commitment to a  minimum of 150 minutes of exercise per week as able.  The importance of healthy food choices with portion control discussed, as well as eating regularly and within a 12 hour window most days. The need to choose "clean , green" food 50 to 75% of the time is discussed, as well as to make water the primary drink and set a goal of 64 ounces water daily.       06/12/2023    3:47 PM 11/05/2022    3:06 PM 03/19/2022    2:51 PM  Weight /BMI  Weight 213 lb 200 lb 190 lb  Height 5\' 6"  (1.676 m) 5\' 5"  (1.651 m) 5\' 5"  (1.651 m)  BMI 34.38 kg/m2 33.28 kg/m2 31.62 kg/m2    Excess weight gain, resume phentermine  , change food choice and commit to regular exercise

## 2023-06-14 NOTE — Progress Notes (Signed)
 Chelsey Lewis     MRN: [id number]      DOB: [birth year]  Chief Complaint  Patient presents with   Hypertension    6 month follow up     HPI Chelsey Lewis is here for follow up and re-evaluation of chronic medical conditions, medication management and review of any available recent lab and radiology data.  Preventive health is updated, specifically  Cancer screening and Immunization.   C/o excess weight gain, babysitting full time, plans to change tahis ROS Denies recent fever or chills. Denies sinus pressure, nasal congestion, ear pain or sore throat. Denies chest congestion, productive cough or wheezing. 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   Denies dysuria, frequency, hesitancy or incontinence. Denies joint pain, swelling and limitation in mobility. Denies headaches, seizures, numbness, or tingling. Denies depression, anxiety or insomnia. Denies skin break down or rash.   PE  BP 117/80   Pulse 78   Resp 16   Ht 5\' 6"  (1.676 m)   Wt 213 lb (96.6 kg)   LMP 10/11/2015 (Approximate)   SpO2 95%   BMI 34.38 kg/m   Patient alert and oriented and in no cardiopulmonary distress.  HEENT: No facial asymmetry, EOMI,     Neck supple .  Chest: Clear to auscultation bilaterally.  CVS: S1, S2 no murmurs, no S3.Regular rate.  ABD: Soft non tender.   Ext: No edema  MS: Adequate ROM spine, shoulders, hips and knees.  Skin: Intact, no ulcerations or rash noted.  Psych: Good eye contact, normal affect. Memory intact not anxious or depressed appearing.  CNS: CN 2-12 intact, power,  normal throughout.no focal deficits noted.   Assessment & Plan  Vitamin D  deficiency Updated lab needed at/ before next visit.   Tubular adenoma of colon Rept colonoscopy due 2025  Obesity (BMI 30.0-34.9)  Patient re-educated about   the importance of commitment to a  minimum of 150 minutes of exercise per week as able.  The importance of healthy food choices with portion control discussed, as well as eating regularly and within a 12 hour window most days. The need to choose "clean , green" food 50 to 75% of the time is discussed, as well as to make water the primary drink and set a goal of 64 ounces water daily.       06/12/2023    3:47 PM 11/05/2022    3:06 PM 03/19/2022    2:51 PM  Weight /BMI  Weight 213 lb 200 lb 190 lb  Height 5\' 6"  (1.676 m) 5\' 5"  (1.651 m) 5\' 5"  (1.651 m)  BMI 34.38 kg/m2 33.28 kg/m2 31.62 kg/m2    Excess weight gain, resume phentermine  , change food choice and commit to regular exercise  Essential hypertension Controlled, no change in medication DASH diet and commitment to daily physical activity for a minimum of 30 minutes discussed and encouraged, as a part of hypertension management. The importance of attaining a healthy weight is also discussed.     06/12/2023    3:47 PM 11/05/2022    3:06 PM 03/19/2022    2:51 PM 10/25/2021    4:12 PM 04/26/2021    1:46 PM 04/26/2021    1:28 PM 11/08/2020   10:35 AM  BP/Weight  Systolic BP 117 135 136 125 120 114 128  Diastolic BP 80 78 83 79 84  78 84  Wt. (Lbs) 213 200 190 186  183   BMI 34.38 kg/m2 33.28 kg/m2 31.62 kg/m2 30.02 kg/m2  30.45 kg/m2        Dyslipidemia, goal LDL below 100 Hyperlipidemia:Low fat diet discussed and encouraged.   Lipid Panel  Lab Results  Component Value Date   CHOL 143 11/05/2022   HDL 44 11/05/2022   LDLCALC 83 11/05/2022   TRIG 81 11/05/2022   CHOLHDL 3.3 11/05/2022     Updated lab needed at/ before next visit. Maintained  on statin

## 2023-06-14 NOTE — Assessment & Plan Note (Signed)
 Rept colonoscopy due 2025

## 2023-06-14 NOTE — Assessment & Plan Note (Signed)
 Hyperlipidemia:Low fat diet discussed and encouraged.   Lipid Panel  Lab Results  Component Value Date   CHOL 143 11/05/2022   HDL 44 11/05/2022   LDLCALC 83 11/05/2022   TRIG 81 11/05/2022   CHOLHDL 3.3 11/05/2022     Updated lab needed at/ before next visit. Maintained  on statin

## 2023-06-16 ENCOUNTER — Encounter (INDEPENDENT_AMBULATORY_CARE_PROVIDER_SITE_OTHER): Payer: Self-pay | Admitting: *Deleted

## 2023-06-17 ENCOUNTER — Other Ambulatory Visit: Payer: Self-pay | Admitting: Family Medicine

## 2023-08-28 ENCOUNTER — Ambulatory Visit: Admitting: Family Medicine

## 2023-09-17 ENCOUNTER — Ambulatory Visit: Payer: Self-pay | Admitting: Family Medicine

## 2023-09-17 LAB — CMP14+EGFR
ALT: 15 IU/L (ref 0–32)
AST: 19 IU/L (ref 0–40)
Albumin: 4.5 g/dL (ref 3.8–4.9)
Alkaline Phosphatase: 85 IU/L (ref 44–121)
BUN/Creatinine Ratio: 10 (ref 9–23)
BUN: 10 mg/dL (ref 6–24)
Bilirubin Total: 0.3 mg/dL (ref 0.0–1.2)
CO2: 22 mmol/L (ref 20–29)
Calcium: 10.9 mg/dL — ABNORMAL HIGH (ref 8.7–10.2)
Chloride: 105 mmol/L (ref 96–106)
Creatinine, Ser: 1.05 mg/dL — ABNORMAL HIGH (ref 0.57–1.00)
Globulin, Total: 2.9 g/dL (ref 1.5–4.5)
Glucose: 82 mg/dL (ref 70–99)
Potassium: 4.4 mmol/L (ref 3.5–5.2)
Sodium: 140 mmol/L (ref 134–144)
Total Protein: 7.4 g/dL (ref 6.0–8.5)
eGFR: 63 mL/min/1.73 (ref >59)

## 2023-09-17 LAB — CBC WITH DIFFERENTIAL/PLATELET
Basophils Absolute: 0.1 x10E3/uL (ref 0.0–0.2)
Basos: 1 %
EOS (ABSOLUTE): 0.3 x10E3/uL (ref 0.0–0.4)
Eos: 4 %
Hematocrit: 39.4 % (ref 34.0–46.6)
Hemoglobin: 13 g/dL (ref 11.1–15.9)
Immature Grans (Abs): 0 x10E3/uL (ref 0.0–0.1)
Immature Granulocytes: 0 %
Lymphocytes Absolute: 3.1 x10E3/uL (ref 0.7–3.1)
Lymphs: 44 %
MCH: 29.7 pg (ref 26.6–33.0)
MCHC: 33 g/dL (ref 31.5–35.7)
MCV: 90 fL (ref 79–97)
Monocytes Absolute: 0.5 x10E3/uL (ref 0.1–0.9)
Monocytes: 7 %
Neutrophils Absolute: 3.1 x10E3/uL (ref 1.4–7.0)
Neutrophils: 44 %
Platelets: 396 x10E3/uL (ref 150–450)
RBC: 4.37 x10E6/uL (ref 3.77–5.28)
RDW: 13.7 % (ref 11.7–15.4)
WBC: 6.9 x10E3/uL (ref 3.4–10.8)

## 2023-09-17 LAB — LIPID PANEL
Chol/HDL Ratio: 3.2 ratio (ref 0.0–4.4)
Cholesterol, Total: 139 mg/dL (ref 100–199)
HDL: 43 mg/dL (ref >39)
LDL Chol Calc (NIH): 84 mg/dL (ref 0–99)
Triglycerides: 55 mg/dL (ref 0–149)
VLDL Cholesterol Cal: 12 mg/dL (ref 5–40)

## 2023-09-17 LAB — TSH: TSH: 1.5 u[IU]/mL (ref 0.450–4.500)

## 2023-09-17 LAB — VITAMIN D 25 HYDROXY (VIT D DEFICIENCY, FRACTURES): Vit D, 25-Hydroxy: 26.1 ng/mL — ABNORMAL LOW (ref 30.0–100.0)

## 2023-09-18 ENCOUNTER — Encounter: Payer: Self-pay | Admitting: Family Medicine

## 2023-09-18 ENCOUNTER — Ambulatory Visit: Admitting: Family Medicine

## 2023-09-18 VITALS — BP 124/77 | HR 83 | Resp 18 | Ht 66.0 in | Wt 205.0 lb

## 2023-09-18 DIAGNOSIS — M65341 Trigger finger, right ring finger: Secondary | ICD-10-CM

## 2023-09-18 DIAGNOSIS — Z23 Encounter for immunization: Secondary | ICD-10-CM

## 2023-09-18 DIAGNOSIS — I10 Essential (primary) hypertension: Secondary | ICD-10-CM | POA: Diagnosis not present

## 2023-09-18 DIAGNOSIS — E785 Hyperlipidemia, unspecified: Secondary | ICD-10-CM

## 2023-09-18 DIAGNOSIS — E66811 Obesity, class 1: Secondary | ICD-10-CM

## 2023-09-18 MED ORDER — ROSUVASTATIN CALCIUM 10 MG PO TABS: 10.0000 mg | ORAL_TABLET | Freq: Every day | ORAL | 2 refills | Status: DC

## 2023-09-18 MED ORDER — PHENTERMINE HCL 37.5 MG PO TABS: ORAL_TABLET | ORAL | 1 refills | Status: DC

## 2023-09-18 MED ORDER — AMLODIPINE BESYLATE 10 MG PO TABS: 10.0000 mg | ORAL_TABLET | Freq: Every day | ORAL | 11 refills | Status: DC

## 2023-09-18 NOTE — Assessment & Plan Note (Signed)
 6 week history, refer Ortho

## 2023-09-18 NOTE — Patient Instructions (Signed)
 F/U in 4 months  Pls schedule mammogram at checkout  Hep B #2 and #3 in 2 and 6 months to be scheduled as Nurse visits  Congrats on weight loss  Labs are excellent  Hep B #1 today  Flu vaccine today  You are referred to Ortho re rigth ring finger trigger x 6 weeks  It is important that you exercise regularly at least 30 minutes 5 times a week. If you develop chest pain, have severe difficulty breathing, or feel very tired, stop exercising immediately and seek medical attention   Think about what you will eat, plan ahead. Choose  clean, green, fresh or frozen over canned, processed or packaged foods which are more sugary, salty and fatty. 70 to 75% of food eaten should be vegetables and fruit. Three meals at set times with snacks allowed between meals, but they must be fruit or vegetables. Aim to eat over a 12 hour period , example 7 am to 7 pm, and STOP after  your last meal of the day. Drink water,generally about 64 ounces per day, no other drink is as healthy. Fruit juice is best enjoyed in a healthy way, by EATING the fruit. Thanks for choosing [facility], we consider it a privelige to serve you.

## 2023-09-21 ENCOUNTER — Encounter: Payer: Self-pay | Admitting: Family Medicine

## 2023-09-21 NOTE — Assessment & Plan Note (Signed)
 Hyperlipidemia:Low fat diet discussed and encouraged.   Lipid Panel  Lab Results  Component Value Date   CHOL 139 09/16/2023   HDL 43 09/16/2023   LDLCALC 84 09/16/2023   TRIG 55 09/16/2023   CHOLHDL 3.2 09/16/2023

## 2023-09-21 NOTE — Assessment & Plan Note (Signed)
 Controlled, no change in medication DASH diet and commitment to daily physical activity for a minimum of 30 minutes discussed and encouraged, as a part of hypertension management. The importance of attaining a healthy weight is also discussed.     09/18/2023    2:40 PM 06/12/2023    3:47 PM 11/05/2022    3:06 PM 03/19/2022    2:51 PM 10/25/2021    4:12 PM 04/26/2021    1:46 PM 04/26/2021    1:28 PM  BP/Weight  Systolic BP 124 117 135 136 125 120 114  Diastolic BP 77 80 78 83 79 84 78  Wt. (Lbs) 205 213 200 190 186  183  BMI 33.09 kg/m2 34.38 kg/m2 33.28 kg/m2 31.62 kg/m2 30.02 kg/m2  30.45 kg/m2

## 2023-09-21 NOTE — Progress Notes (Signed)
 Chelsey Lewis     MRN: [id number]      DOB: [birth year]  Chief Complaint  Patient presents with   Hypertension    11 week follow up     HPI Chelsey Lewis is here for follow up and re-evaluation of chronic medical conditions, medication management and review of any available recent lab and radiology data.  Preventive health is updated, specifically  Cancer screening and Immunization.   Questions or concerns regarding consultations or procedures which the PT has had in the interim are  addressed. The PT denies any adverse reactions to current medications since the last visit.  6 week h/o right ring finger trigger   ROS Denies recent fever or chills. Denies sinus pressure, nasal congestion, ear pain or sore throat. Denies chest congestion, productive cough or wheezing. Denies chest pains, palpitations and leg swelling Denies abdominal pain, nausea, vomiting,diarrhea or constipation.   Denies dysuria, frequency, hesitancy or incontinence. Denies headaches, seizures, numbness, or tingling. Denies depression, anxiety or insomnia. Denies skin break down or rash.   PE  BP 124/77   Pulse 83   Resp 18   Ht 5' 6 (1.676 m)   Wt 205 lb (93 kg)   LMP 10/11/2015 (Approximate)   SpO2 95%   BMI 33.09 kg/m   Patient alert and oriented and in no cardiopulmonary distress.  HEENT: No facial asymmetry, EOMI,     Neck supple .  Chest: Clear to auscultation bilaterally.  CVS: S1, S2 no murmurs, no S3.Regular rate.  ABD: Soft non tender.   Ext: No edema  MS: Adequate ROM spine, shoulders, hips and knees.Trigger finger , right ring  Skin: Intact, no ulcerations or rash noted.  Psych: Good eye contact, normal affect. Memory intact not anxious or depressed appearing.  CNS: CN 2-12 intact, power,  normal throughout.no focal deficits noted.   Assessment & Plan  Trigger finger, right ring finger 6 week history, refer Ortho  Essential hypertension Controlled, no change in  medication DASH diet and commitment to daily physical activity for a minimum of 30 minutes discussed and encouraged, as a part of hypertension management. The importance of attaining a healthy weight is also discussed.     09/18/2023    2:40 PM 06/12/2023    3:47 PM 11/05/2022    3:06 PM 03/19/2022    2:51 PM 10/25/2021    4:12 PM 04/26/2021    1:46 PM 04/26/2021    1:28 PM  BP/Weight  Systolic BP 124 117 135 136 125 120 114  Diastolic BP 77 80 78 83 79 84 78  Wt. (Lbs) 205 213 200 190 186  183  BMI 33.09 kg/m2 34.38 kg/m2 33.28 kg/m2 31.62 kg/m2 30.02 kg/m2  30.45 kg/m2       Obesity (BMI 30.0-34.9) Improving with lifestle change and medication continue both  Patient re-educated about  the importance of commitment to a  minimum of 150 minutes of exercise per week as able.  The importance of healthy food choices with portion control discussed, as well as eating regularly and within a 12 hour window most days. The need to choose clean , green food 50 to 75% of the time is discussed, as well as to make water the primary drink and set a goal of 64 ounces water daily.       09/18/2023    2:40 PM 06/12/2023    3:47 PM 11/05/2022    3:06 PM  Weight /BMI  Weight 205 lb 213 lb 200  lb  Height 5' 6 (1.676 m) 5' 6 (1.676 m) 5' 5 (1.651 m)  BMI 33.09 kg/m2 34.38 kg/m2 33.28 kg/m2      Dyslipidemia, goal LDL below 100 Hyperlipidemia:Low fat diet discussed and encouraged.   Lipid Panel  Lab Results  Component Value Date   CHOL 139 09/16/2023   HDL 43 09/16/2023   LDLCALC 84 09/16/2023   TRIG 55 09/16/2023   CHOLHDL 3.2 09/16/2023

## 2023-09-21 NOTE — Assessment & Plan Note (Signed)
 Improving with lifestle change and medication continue both  Patient re-educated about  the importance of commitment to a  minimum of 150 minutes of exercise per week as able.  The importance of healthy food choices with portion control discussed, as well as eating regularly and within a 12 hour window most days. The need to choose clean , green food 50 to 75% of the time is discussed, as well as to make water the primary drink and set a goal of 64 ounces water daily.       09/18/2023    2:40 PM 06/12/2023    3:47 PM 11/05/2022    3:06 PM  Weight /BMI  Weight 205 lb 213 lb 200 lb  Height 5' 6 (1.676 m) 5' 6 (1.676 m) 5' 5 (1.651 m)  BMI 33.09 kg/m2 34.38 kg/m2 33.28 kg/m2

## 2023-10-19 ENCOUNTER — Telehealth: Payer: Self-pay | Admitting: Family Medicine

## 2023-10-19 ENCOUNTER — Ambulatory Visit (HOSPITAL_COMMUNITY)

## 2023-10-19 NOTE — Telephone Encounter (Unsigned)
 Copied from CRM [id number]. Topic: Clinical - Medication Refill >> Oct 19, 2023  4:38 PM Harlene ORN wrote: Medication: amLODipine  (NORVASC ) 10 MG tablet  Has the patient contacted their pharmacy? Yes (Agent: If no, request that the patient contact the pharmacy for the refill. If patient does not wish to contact the pharmacy document the reason why and proceed with request.) (Agent: If yes, when and what did the pharmacy advise?)  This is the patient's preferred pharmacy:  [id number] - [place], [place] - [address] AT [facility] [address] [place] [address] Phone: [phone] Fax: [phone]  Is this the correct pharmacy for this prescription? Yes If no, delete pharmacy and type the correct one.   Has the prescription been filled recently? No  Is the patient out of the medication? Yes  Has the patient been seen for an appointment in the last year OR does the patient have an upcoming appointment? Yes  Can we respond through MyChart? Yes  Agent: Please be advised that Rx refills may take up to 3 business days. We ask that you follow-up with your pharmacy.

## 2023-10-19 NOTE — Telephone Encounter (Signed)
 Patient needs to contact pharmacy , refills are at pharmacy

## 2023-11-09 ENCOUNTER — Ambulatory Visit (HOSPITAL_COMMUNITY)
Admission: RE | Admit: 2023-11-09 | Discharge: 2023-11-09 | Disposition: A | Discharge status: Home / Self Care | Source: Ambulatory Visit | Attending: Family Medicine | Admitting: Family Medicine

## 2023-11-09 DIAGNOSIS — Z1231 Encounter for screening mammogram for malignant neoplasm of breast: Secondary | ICD-10-CM | POA: Insufficient documentation

## 2024-02-03 ENCOUNTER — Encounter: Payer: Self-pay | Admitting: Family Medicine

## 2024-02-03 ENCOUNTER — Ambulatory Visit: Admitting: Family Medicine

## 2024-02-03 VITALS — BP 120/79 | HR 94 | Resp 18 | Ht 66.0 in | Wt 185.1 lb

## 2024-02-03 DIAGNOSIS — N1831 Chronic kidney disease, stage 3a: Secondary | ICD-10-CM | POA: Diagnosis not present

## 2024-02-03 DIAGNOSIS — Z23 Encounter for immunization: Secondary | ICD-10-CM | POA: Diagnosis not present

## 2024-02-03 DIAGNOSIS — I10 Essential (primary) hypertension: Secondary | ICD-10-CM | POA: Diagnosis not present

## 2024-02-03 DIAGNOSIS — E559 Vitamin D deficiency, unspecified: Secondary | ICD-10-CM

## 2024-02-03 DIAGNOSIS — Z0000 Encounter for general adult medical examination without abnormal findings: Secondary | ICD-10-CM

## 2024-02-03 DIAGNOSIS — D126 Benign neoplasm of colon, unspecified: Secondary | ICD-10-CM

## 2024-02-03 DIAGNOSIS — E785 Hyperlipidemia, unspecified: Secondary | ICD-10-CM

## 2024-02-03 MED ORDER — PHENTERMINE HCL 37.5 MG PO TABS: ORAL_TABLET | ORAL | 1 refills | Status: AC

## 2024-02-03 MED ORDER — AMLODIPINE BESYLATE 10 MG PO TABS: 10.0000 mg | ORAL_TABLET | Freq: Every day | ORAL | 3 refills | Status: AC

## 2024-02-03 MED ORDER — ROSUVASTATIN CALCIUM 10 MG PO TABS: 10.0000 mg | ORAL_TABLET | Freq: Every day | ORAL | 3 refills | Status: AC

## 2024-02-03 NOTE — Assessment & Plan Note (Signed)
 Annual exam as documented. . Immunization and cancer screening needs are specifically addressed at this visit.

## 2024-02-03 NOTE — Patient Instructions (Addendum)
 F/U in 16 to 18 weeks  Annual with Leita in 1 year  Colonoscopy due 11/2025 with dr Debbie  Mammogram due in Oct 28 or after , please schedule at checkout pt request  CONGRATS, keep up the great work  Weight loss goal of 3 to 4 pounds per month  It is important that you exercise regularly at least 30 minutes 5 times a week. If you develop chest pain, have severe difficulty breathing, or feel very tired, stop exercising immediately and seek medical attention   Thanks for choosing [facility], we consider it a privelige to serve you. Hep B#2 and Pneumonia vaccine today  Fasting CBC, lipid, cmp and EGFR, TSH and vit D 3 to 5 days before next appt

## 2024-02-06 ENCOUNTER — Encounter: Payer: Self-pay | Admitting: Family Medicine

## 2024-02-06 NOTE — Progress Notes (Signed)
" ° ° °  Chelsey Lewis     MRN: [id number]      DOB: April 19, 1970  Chief Complaint  Patient presents with   Annual Exam    HPI: Patient is in for annual physical exam. No other health concerns are expressed or addressed at the visit. Recent labs,  are reviewed. Immunization is reviewed , and  updated if needed.   PE: BP 120/79   Pulse 94   Resp 18   Ht 5' 6 (1.676 m)   Wt 185 lb 1.9 oz (84 kg)   LMP 10/11/2015   SpO2 97%   BMI 29.88 kg/m   Pleasant  female, alert and oriented x 3, in no cardio-pulmonary distress. Afebrile. HEENT No facial trauma or asymetry. Sinuses non tender.  Extra occullar muscles intact.. External ears normal, . Neck: supple, no adenopathy,JVD or thyromegaly.No bruits.  Chest: Clear to ascultation bilaterally.No crackles or wheezes. Non tender to palpation   Cardiovascular system; Heart sounds normal,  S1 and  S2 ,no S3.  No murmur, or thrill.  Peripheral pulses normal.  Abdomen: Soft, non tender, no organomegaly or masses. No bruits. Bowel sounds normal. No guarding, tenderness or rebound.   Musculoskeletal exam: Full ROM of spine, hips , shoulders and knees. No deformity ,swelling or crepitus noted. No muscle wasting or atrophy.   Neurologic: Cranial nerves 2 to 12 intact. Power, tone ,sensation and reflexes normal throughout. No disturbance in gait. No tremor.  Skin: Intact, no ulceration, erythema , scaling or rash noted. Pigmentation normal throughout  Psych; Normal mood and affect. Judgement and concentration normal   Assessment & Plan:  Encounter for annual physical exam Annual exam as documented. Immunization and cancer screening needs are specifically addressed at this visit.   Immunization due After obtaining informed consent, the pneumonia and Hep B vaccines are   administered , with no adverse effect noted at the time of administration.  "

## 2024-02-06 NOTE — Assessment & Plan Note (Signed)
 After obtaining informed consent, the pneumonia and Hep B vaccines are   administered , with no adverse effect noted at the time of administration.

## 2024-05-25 ENCOUNTER — Ambulatory Visit: Payer: Self-pay | Admitting: Family Medicine
